# Patient Record
Sex: Female | Born: 1960 | Race: White | Hispanic: No | Marital: Married | Smoking: Current every day smoker
Health system: Southern US, Community
[De-identification: ages and names within clinical notes are randomized; demographics above are authoritative.]

## PROBLEM LIST (undated history)

## (undated) DIAGNOSIS — Z720 Tobacco use: Secondary | ICD-10-CM

## (undated) DIAGNOSIS — J449 Chronic obstructive pulmonary disease, unspecified: Secondary | ICD-10-CM

## (undated) DIAGNOSIS — C3490 Malignant neoplasm of unspecified part of unspecified bronchus or lung: Secondary | ICD-10-CM

## (undated) DIAGNOSIS — I619 Nontraumatic intracerebral hemorrhage, unspecified: Secondary | ICD-10-CM

## (undated) DIAGNOSIS — M1990 Unspecified osteoarthritis, unspecified site: Secondary | ICD-10-CM

## (undated) DIAGNOSIS — J189 Pneumonia, unspecified organism: Secondary | ICD-10-CM

## (undated) DIAGNOSIS — C539 Malignant neoplasm of cervix uteri, unspecified: Secondary | ICD-10-CM

## (undated) DIAGNOSIS — I10 Essential (primary) hypertension: Secondary | ICD-10-CM

## (undated) HISTORY — PX: OTHER SURGICAL HISTORY: SHX169

## (undated) HISTORY — PX: BRONCHOSCOPY: SUR163

## (undated) HISTORY — PX: MULTIPLE TOOTH EXTRACTIONS: SHX2053

## (undated) HISTORY — DX: Chronic obstructive pulmonary disease, unspecified: J44.9

---

## 2007-06-21 ENCOUNTER — Ambulatory Visit: Payer: Self-pay | Admitting: Physician Assistant

## 2007-07-02 ENCOUNTER — Ambulatory Visit: Payer: Self-pay | Admitting: Cardiology

## 2007-08-16 ENCOUNTER — Ambulatory Visit: Payer: Self-pay | Admitting: Internal Medicine

## 2007-10-08 ENCOUNTER — Ambulatory Visit (HOSPITAL_COMMUNITY): Admission: RE | Admit: 2007-10-08 | Discharge: 2007-10-08 | Payer: Self-pay | Admitting: Family Medicine

## 2007-10-17 ENCOUNTER — Ambulatory Visit (HOSPITAL_COMMUNITY): Admission: RE | Admit: 2007-10-17 | Discharge: 2007-10-17 | Payer: Self-pay | Admitting: Family Medicine

## 2010-11-28 DIAGNOSIS — I619 Nontraumatic intracerebral hemorrhage, unspecified: Secondary | ICD-10-CM

## 2010-11-28 HISTORY — DX: Nontraumatic intracerebral hemorrhage, unspecified: I61.9
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
## 2011-08-14 ENCOUNTER — Emergency Department (HOSPITAL_COMMUNITY)
Admission: EM | Admit: 2011-08-14 | Discharge: 2011-08-15 | Disposition: A | Discharge status: Home / Self Care | Payer: Self-pay | Attending: Emergency Medicine | Admitting: Emergency Medicine

## 2011-08-14 ENCOUNTER — Encounter: Payer: Self-pay | Admitting: *Deleted

## 2011-08-14 DIAGNOSIS — S59909A Unspecified injury of unspecified elbow, initial encounter: Secondary | ICD-10-CM | POA: Insufficient documentation

## 2011-08-14 DIAGNOSIS — M25519 Pain in unspecified shoulder: Secondary | ICD-10-CM | POA: Insufficient documentation

## 2011-08-14 DIAGNOSIS — S46909A Unspecified injury of unspecified muscle, fascia and tendon at shoulder and upper arm level, unspecified arm, initial encounter: Secondary | ICD-10-CM | POA: Insufficient documentation

## 2011-08-14 DIAGNOSIS — S63509A Unspecified sprain of unspecified wrist, initial encounter: Secondary | ICD-10-CM

## 2011-08-14 DIAGNOSIS — S6990XA Unspecified injury of unspecified wrist, hand and finger(s), initial encounter: Secondary | ICD-10-CM | POA: Insufficient documentation

## 2011-08-14 DIAGNOSIS — Y92009 Unspecified place in unspecified non-institutional (private) residence as the place of occurrence of the external cause: Secondary | ICD-10-CM | POA: Insufficient documentation

## 2011-08-14 DIAGNOSIS — M25529 Pain in unspecified elbow: Secondary | ICD-10-CM | POA: Insufficient documentation

## 2011-08-14 DIAGNOSIS — S4980XA Other specified injuries of shoulder and upper arm, unspecified arm, initial encounter: Secondary | ICD-10-CM | POA: Insufficient documentation

## 2011-08-14 HISTORY — DX: Malignant neoplasm of cervix uteri, unspecified: C53.9

## 2011-08-14 HISTORY — DX: Pneumonia, unspecified organism: J18.9
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
## 2011-08-15 ENCOUNTER — Emergency Department (HOSPITAL_COMMUNITY): Payer: Self-pay

## 2011-08-15 MED ORDER — IBUPROFEN 400 MG PO TABS
400.0000 mg | ORAL_TABLET | Freq: Once | ORAL | Status: AC
  Administered 2011-08-15: 400 mg via ORAL
  Filled 2011-08-15: qty 1

## 2011-08-15 NOTE — ED Notes (Signed)
Pt reports pain in right shoulder and right wrist.  Presently, pt able to move extremities.  No deformity noted.
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
## 2011-08-30 NOTE — ED Provider Notes (Signed)
Medical screening examination/treatment/procedure(s) were performed by non-physician practitioner and as supervising physician I was immediately available for consultation/collaboration.  Rose Hippler S. Aleaya Latona, MD 08/30/11 0753 

## 2011-10-02 ENCOUNTER — Encounter (HOSPITAL_COMMUNITY): Payer: Self-pay | Admitting: *Deleted

## 2011-10-02 ENCOUNTER — Other Ambulatory Visit: Payer: Self-pay

## 2011-10-02 ENCOUNTER — Emergency Department (HOSPITAL_COMMUNITY)
Admission: EM | Admit: 2011-10-02 | Discharge: 2011-10-03 | Disposition: A | Discharge status: Home / Self Care | Payer: Self-pay | Attending: Emergency Medicine | Admitting: Emergency Medicine

## 2011-10-02 ENCOUNTER — Emergency Department (HOSPITAL_COMMUNITY): Payer: Self-pay

## 2011-10-02 DIAGNOSIS — R0602 Shortness of breath: Secondary | ICD-10-CM | POA: Insufficient documentation

## 2011-10-02 DIAGNOSIS — R222 Localized swelling, mass and lump, trunk: Secondary | ICD-10-CM | POA: Insufficient documentation

## 2011-10-02 DIAGNOSIS — R0600 Dyspnea, unspecified: Secondary | ICD-10-CM

## 2011-10-02 DIAGNOSIS — R05 Cough: Secondary | ICD-10-CM | POA: Insufficient documentation

## 2011-10-02 DIAGNOSIS — F17200 Nicotine dependence, unspecified, uncomplicated: Secondary | ICD-10-CM | POA: Insufficient documentation

## 2011-10-02 DIAGNOSIS — Z85118 Personal history of other malignant neoplasm of bronchus and lung: Secondary | ICD-10-CM | POA: Insufficient documentation

## 2011-10-02 DIAGNOSIS — Z8541 Personal history of malignant neoplasm of cervix uteri: Secondary | ICD-10-CM | POA: Insufficient documentation

## 2011-10-02 DIAGNOSIS — R059 Cough, unspecified: Secondary | ICD-10-CM | POA: Insufficient documentation

## 2011-10-02 DIAGNOSIS — R0609 Other forms of dyspnea: Secondary | ICD-10-CM | POA: Insufficient documentation

## 2011-10-02 DIAGNOSIS — R918 Other nonspecific abnormal finding of lung field: Secondary | ICD-10-CM

## 2011-10-02 DIAGNOSIS — I4510 Unspecified right bundle-branch block: Secondary | ICD-10-CM | POA: Insufficient documentation

## 2011-10-02 DIAGNOSIS — R0989 Other specified symptoms and signs involving the circulatory and respiratory systems: Secondary | ICD-10-CM | POA: Insufficient documentation

## 2011-10-02 HISTORY — DX: Malignant neoplasm of unspecified part of unspecified bronchus or lung: C34.90

## 2011-10-02 LAB — CARDIAC PANEL(CRET KIN+CKTOT+MB+TROPI)
Relative Index: INVALID (ref 0.0–2.5)
Total CK: 67 U/L (ref 7–177)
Troponin I: <0.3 ng/mL (ref <0.30)

## 2011-10-02 LAB — BASIC METABOLIC PANEL
BUN: 3 mg/dL — ABNORMAL LOW (ref 6–23)
CO2: 26 mEq/L (ref 19–32)
GFR calc non Af Amer: >90 mL/min (ref >90)
Glucose, Bld: 86 mg/dL (ref 70–99)
Potassium: 3.8 mEq/L (ref 3.5–5.1)
Sodium: 137 mEq/L (ref 135–145)

## 2011-10-02 LAB — URINALYSIS, ROUTINE W REFLEX MICROSCOPIC
Glucose, UA: NEGATIVE mg/dL
Hgb urine dipstick: NEGATIVE
Leukocytes, UA: NEGATIVE
Nitrite: NEGATIVE
Protein, ur: NEGATIVE mg/dL

## 2011-10-02 LAB — CBC
Hemoglobin: 13.5 g/dL (ref 12.0–15.0)
MCH: 32.2 pg (ref 26.0–34.0)
MCHC: 33.3 g/dL (ref 30.0–36.0)
WBC: 11.3 10*3/uL — ABNORMAL HIGH (ref 4.0–10.5)

## 2011-10-02 MED ORDER — ALBUTEROL SULFATE (5 MG/ML) 0.5% IN NEBU
2.5000 mg | INHALATION_SOLUTION | Freq: Once | RESPIRATORY_TRACT | Status: AC
  Administered 2011-10-02: 2.5 mg via RESPIRATORY_TRACT
  Filled 2011-10-02: qty 0.5

## 2011-10-02 NOTE — ED Provider Notes (Signed)
History    Scribed for Glynn Octave, MD, the patient was seen in room APA11/APA11. This chart was scribed by Katha Cabal.   CSN: [id number] Arrival date & time: 10/02/2011 10:09 PM   First MD Initiated Contact with Patient 10/02/11 2223      Chief Complaint  Patient presents with  . Shortness of Breath    (Consider location/radiation/quality/duration/timing/severity/associated sxs/prior treatment) HPI Beth [name] is a 50 y.o. female brought in by ambulance, who presents to the Emergency Department complaining of constant persistent SOB for the past couple weeks.  SOB is associated with intermittent cough in the last couple days.  Patient has not used breathing treatment.  Shortness of breath is relieved by nothing.  Patient admits drinking EtOH beverages this evening.  Patient denies fever, leg pain and chest pain.   Patient states she was diagnosed in April with mass in lung. Patient has follow up appointment on 10/28/11 with Dr. Orson Aloe.  Patient is a smoker.   PCP   Dr. Orson Aloe        Past Medical History  Diagnosis Date  . Lung infection   . Cervical cancer   . Lung cancer     Past Surgical History  Procedure Date  . Rod in arm     No family history on file.  History  Substance Use Topics  . Smoking status: Current Everyday Smoker -- 0.5 packs/day    Types: Cigarettes  . Smokeless tobacco: Not on file  . Alcohol Use: Yes     couple of beers tonight    OB History    Grav Para Term Preterm Abortions TAB SAB Ect Mult Living                  Review of Systems  All other systems reviewed and are negative.    Allergies  Codeine  Home Medications   Current Outpatient Rx  Name Route Sig Dispense Refill  . AMOXICILLIN PO Oral Take by mouth.        BP 119/71  Pulse 94  Temp(Src) 97.7 F (36.5 C) (Oral)  Resp 20  SpO2 96%  Physical Exam  Constitutional: She is oriented to person, place, and time. She appears well-developed and  well-nourished. No distress. Nasal cannula in place.       Patient smells of alcohol.    HENT:  Head: Normocephalic and atraumatic.  Eyes: Conjunctivae and EOM are normal.  Neck: Neck supple.  Cardiovascular: Normal rate and regular rhythm.   Pulmonary/Chest: Effort normal. No respiratory distress. She has wheezes. She exhibits no tenderness.       Decreased breath sound throughout, moderate air exchange, wheezing   Abdominal: Soft. There is no tenderness.  Musculoskeletal: Normal range of motion. She exhibits no edema.  Neurological: She is alert and oriented to person, place, and time. She has normal strength. No cranial nerve deficit.  Skin: Skin is warm and dry.  Psychiatric: Her behavior is normal. Her speech is slurred.    ED Course  Procedures (including critical care time)   DIAGNOSTIC STUDIES: Oxygen Saturation is 98% on nasal cannula normal by my interpretation.    COORDINATION OF CARE:  10:35 PM  Physical exam complete.  Will order breathing treatment.  11:46 PM Will review CXR.  11:59 PM  CXR findings reviewed.  Will order CT Scan.     Orders Placed This Encounter  Procedures  . DG Chest 2 View  . CT Chest W Contrast  . CBC  .  Basic metabolic panel  . Urinalysis with microscopic  . Cardiac panel(cret kin+cktot+mb+tropi)  . D-dimer, quantitative  . Ethanol  . ED EKG     LABS / RADIOLOGY:   Labs Reviewed  CBC - Abnormal; Notable for the following:    WBC 11.3 (*)    Platelets 476 (*)    All other components within normal limits  BASIC METABOLIC PANEL - Abnormal; Notable for the following:    BUN 3 (*)    Creatinine, Ser 0.43 (*)    All other components within normal limits  URINALYSIS, ROUTINE W REFLEX MICROSCOPIC - Abnormal; Notable for the following:    Specific Gravity, Urine <1.005 (*)    All other components within normal limits  ETHANOL - Abnormal; Notable for the following:    Alcohol, Ethyl (B) 300 (*)    All other components within  normal limits  CARDIAC PANEL(CRET KIN+CKTOT+MB+TROPI)  D-DIMER, QUANTITATIVE   Dg Chest 2 View  10/02/2011  *RADIOLOGY REPORT*  Clinical Data: Short of breath  CHEST - 2 VIEW  Comparison: None.  Findings: There is a spiculated density in the central right upper lobe.  Heart is normal in size.  Lungs are hyperaerated.  No pneumothorax.  No pleural effusion.  Thoracic spine is intact.  IMPRESSION: Spiculated central right upper lobe density as described. Underlying pulmonary nodule is not excluded.  Comparison with prior studies if available would be helpful.  If none are available, CT can be performed to further delineate.  Original Report Authenticated By: Donavan Burnet, M.D.         MDM   MDM: 2 week history of persistent shortness of breath with nonproductive cough. No fever, chest pain, leg pain or swelling. Patient gives a vague history of possible lung mass that she has not received a diagnosis of yet. No current chemotherapy or radiation.  No distress.  No record of lung mass on chart review.  Vitals stable.  ETOH 300.  Remains somnolent but arousable and protecting airway.  Will need reassessment when clinically sober. CXR with RUL abnormality.  Will obtain CT to better evaluate.  Care transferred to Dr. Denton Lank.    Date: 10/02/2011  Rate: 83  Rhythm: normal sinus rhythm  QRS Axis: normal  Intervals: normal  ST/T Wave abnormalities: normal  Conduction Disutrbances:right bundle branch block  Narrative Interpretation:   Old EKG Reviewed: none available    MEDICATIONS GIVEN IN THE E.D. Scheduled Meds:    . albuterol  2.5 mg Nebulization Once   Continuous Infusions:      IMPRESSION: No diagnosis found.   I personally performed the services described in this documentation, which was scribed in my presence.  The recorded information has been reviewed and considered.           Glynn Octave, MD 10/03/11 [phone]

## 2011-10-03 ENCOUNTER — Emergency Department (HOSPITAL_COMMUNITY): Payer: Self-pay

## 2011-10-03 MED ORDER — IOHEXOL 300 MG/ML  SOLN
100.0000 mL | Freq: Once | INTRAMUSCULAR | Status: AC | PRN
  Administered 2011-10-03: 100 mL via INTRAVENOUS

## 2011-10-03 NOTE — ED Provider Notes (Signed)
Signed out by Dr Manus Gunning to d/c when ct resulted. Ct  Resulted w 2 lesions right chest. Discussed w pt. On review earlier md/pt ?whether prior workup done.  Pt to f/u w pcp and pulm for further eval.  Recheck pt no pain.n o increased wob or dyspnea.   Suzi Roots, MD 10/03/11 [phone]

## 2011-10-03 NOTE — ED Notes (Signed)
Pt remains on cardiac monitor w/ NIBP vital signs WNL. Pt resting calmly w/ eyes closed. Rise & fall of the chest noted. NAD noted at this time.

## 2011-10-03 NOTE — ED Notes (Signed)
Pt remains on cardiac monitor w/ NIBP vital signs WNL. Pt resting calmly w/ eyes closed. Rise & fall of the chest noted. NAD noted at this time.   

## 2011-10-26 DIAGNOSIS — S065X9A Traumatic subdural hemorrhage with loss of consciousness of unspecified duration, initial encounter: Secondary | ICD-10-CM | POA: Insufficient documentation

## 2011-10-26 DIAGNOSIS — M47022 Vertebral artery compression syndromes, cervical region: Secondary | ICD-10-CM | POA: Insufficient documentation

## 2011-10-26 DIAGNOSIS — F1010 Alcohol abuse, uncomplicated: Secondary | ICD-10-CM | POA: Insufficient documentation

## 2011-10-26 DIAGNOSIS — F1910 Other psychoactive substance abuse, uncomplicated: Secondary | ICD-10-CM | POA: Insufficient documentation

## 2011-10-26 DIAGNOSIS — S129XXA Fracture of neck, unspecified, initial encounter: Secondary | ICD-10-CM | POA: Insufficient documentation

## 2011-10-26 DIAGNOSIS — R911 Solitary pulmonary nodule: Secondary | ICD-10-CM | POA: Insufficient documentation

## 2011-10-26 DIAGNOSIS — S0230XA Fracture of orbital floor, unspecified side, initial encounter for closed fracture: Secondary | ICD-10-CM | POA: Insufficient documentation

## 2011-10-26 HISTORY — DX: Alcohol abuse, uncomplicated: F10.10

## 2014-11-21 ENCOUNTER — Emergency Department (HOSPITAL_COMMUNITY): Payer: Self-pay

## 2014-11-21 ENCOUNTER — Emergency Department (HOSPITAL_COMMUNITY)
Admission: EM | Admit: 2014-11-21 | Discharge: 2014-11-21 | Disposition: A | Discharge status: Home / Self Care | Payer: Self-pay | Attending: Emergency Medicine | Admitting: Emergency Medicine

## 2014-11-21 ENCOUNTER — Encounter (HOSPITAL_COMMUNITY): Payer: Self-pay | Admitting: *Deleted

## 2014-11-21 DIAGNOSIS — Z8709 Personal history of other diseases of the respiratory system: Secondary | ICD-10-CM | POA: Insufficient documentation

## 2014-11-21 DIAGNOSIS — Z720 Tobacco use: Secondary | ICD-10-CM | POA: Insufficient documentation

## 2014-11-21 DIAGNOSIS — Z8679 Personal history of other diseases of the circulatory system: Secondary | ICD-10-CM | POA: Insufficient documentation

## 2014-11-21 DIAGNOSIS — Y9389 Activity, other specified: Secondary | ICD-10-CM | POA: Insufficient documentation

## 2014-11-21 DIAGNOSIS — S0990XA Unspecified injury of head, initial encounter: Secondary | ICD-10-CM | POA: Insufficient documentation

## 2014-11-21 DIAGNOSIS — S79911A Unspecified injury of right hip, initial encounter: Secondary | ICD-10-CM | POA: Insufficient documentation

## 2014-11-21 DIAGNOSIS — Z85118 Personal history of other malignant neoplasm of bronchus and lung: Secondary | ICD-10-CM | POA: Insufficient documentation

## 2014-11-21 DIAGNOSIS — Y998 Other external cause status: Secondary | ICD-10-CM | POA: Insufficient documentation

## 2014-11-21 DIAGNOSIS — Z8541 Personal history of malignant neoplasm of cervix uteri: Secondary | ICD-10-CM | POA: Insufficient documentation

## 2014-11-21 DIAGNOSIS — S46911A Strain of unspecified muscle, fascia and tendon at shoulder and upper arm level, right arm, initial encounter: Secondary | ICD-10-CM | POA: Insufficient documentation

## 2014-11-21 DIAGNOSIS — Y9289 Other specified places as the place of occurrence of the external cause: Secondary | ICD-10-CM | POA: Insufficient documentation

## 2014-11-21 DIAGNOSIS — S301XXA Contusion of abdominal wall, initial encounter: Secondary | ICD-10-CM | POA: Insufficient documentation

## 2014-11-21 HISTORY — DX: Nontraumatic intracerebral hemorrhage, unspecified: I61.9

## 2014-11-21 LAB — CBC WITH DIFFERENTIAL/PLATELET
BASOS PCT: 1 % (ref 0–1)
Basophils Absolute: 0.1 10*3/uL (ref 0.0–0.1)
EOS ABS: 0.4 10*3/uL (ref 0.0–0.7)
Eosinophils Relative: 3 % (ref 0–5)
HCT: 44.2 % (ref 36.0–46.0)
HEMOGLOBIN: 14.7 g/dL (ref 12.0–15.0)
Lymphocytes Relative: 40 % (ref 12–46)
Lymphs Abs: 4.8 10*3/uL — ABNORMAL HIGH (ref 0.7–4.0)
MCH: 32.2 pg (ref 26.0–34.0)
MCHC: 33.3 g/dL (ref 30.0–36.0)
MCV: 96.9 fL (ref 78.0–100.0)
MONO ABS: 1 10*3/uL (ref 0.1–1.0)
Monocytes Relative: 8 % (ref 3–12)
Neutro Abs: 5.9 10*3/uL (ref 1.7–7.7)
Neutrophils Relative %: 48 % (ref 43–77)
PLATELETS: 355 10*3/uL (ref 150–400)
RBC: 4.56 MIL/uL (ref 3.87–5.11)
RDW: 13.1 % (ref 11.5–15.5)
WBC: 12.1 10*3/uL — AB (ref 4.0–10.5)

## 2014-11-21 LAB — COMPREHENSIVE METABOLIC PANEL
ALK PHOS: 46 U/L (ref 39–117)
ALT: 20 U/L (ref 0–35)
ANION GAP: 9 (ref 5–15)
AST: 27 U/L (ref 0–37)
Albumin: 4.5 g/dL (ref 3.5–5.2)
BUN: 5 mg/dL — ABNORMAL LOW (ref 6–23)
CALCIUM: 8.9 mg/dL (ref 8.4–10.5)
CHLORIDE: 110 meq/L (ref 96–112)
CO2: 23 mmol/L (ref 19–32)
Creatinine, Ser: 0.49 mg/dL — ABNORMAL LOW (ref 0.50–1.10)
GFR calc Af Amer: >90 mL/min (ref >90)
GLUCOSE: 82 mg/dL (ref 70–99)
Potassium: 3.7 mmol/L (ref 3.5–5.1)
SODIUM: 142 mmol/L (ref 135–145)
Total Bilirubin: 0.4 mg/dL (ref 0.3–1.2)
Total Protein: 7.9 g/dL (ref 6.0–8.3)

## 2014-11-21 MED ORDER — SODIUM CHLORIDE 0.9 % IJ SOLN
INTRAMUSCULAR | Status: AC
  Filled 2014-11-21: qty 600

## 2014-11-21 MED ORDER — SODIUM CHLORIDE 0.9 % IV BOLUS (SEPSIS)
500.0000 mL | Freq: Once | INTRAVENOUS | Status: AC
  Administered 2014-11-21: 500 mL via INTRAVENOUS

## 2014-11-21 MED ORDER — OXYCODONE-ACETAMINOPHEN 5-325 MG PO TABS
1.0000 | ORAL_TABLET | Freq: Four times a day (QID) | ORAL | Status: DC | PRN
Start: 2014-11-21 — End: 2019-10-30

## 2014-11-21 MED ORDER — ONDANSETRON HCL 4 MG/2ML IJ SOLN
4.0000 mg | Freq: Once | INTRAMUSCULAR | Status: AC
  Administered 2014-11-21: 4 mg via INTRAVENOUS
  Filled 2014-11-21: qty 2

## 2014-11-21 MED ORDER — IOHEXOL 300 MG/ML  SOLN
100.0000 mL | Freq: Once | INTRAMUSCULAR | Status: AC | PRN
  Administered 2014-11-21: 100 mL via INTRAVENOUS

## 2014-11-21 MED ORDER — SODIUM CHLORIDE 0.9 % IJ SOLN
INTRAMUSCULAR | Status: AC
  Filled 2014-11-21: qty 30

## 2014-11-21 MED ORDER — HYDROMORPHONE HCL 1 MG/ML IJ SOLN
0.5000 mg | Freq: Once | INTRAMUSCULAR | Status: AC
  Administered 2014-11-21: 0.5 mg via INTRAVENOUS
  Filled 2014-11-21: qty 1

## 2014-11-21 MED ORDER — SODIUM CHLORIDE 0.9 % IJ SOLN
INTRAMUSCULAR | Status: AC
  Filled 2014-11-21: qty 100
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
## 2014-11-21 NOTE — Discharge Instructions (Signed)
Shoulder Sprain A shoulder sprain is the result of damage to the tough, fiber-like tissues (ligaments) that help hold your shoulder in place. The ligaments may be stretched or torn. Besides the main shoulder joint (the ball and socket), there are several smaller joints that connect the bones in this area. A sprain usually involves one of those joints. Most often it is the acromioclavicular (or AC) joint. That is the joint that connects the collarbone (clavicle) and the shoulder blade (scapula) at the top point of the shoulder blade (acromion). A shoulder sprain is a mild form of what is called a shoulder separation. Recovering from a shoulder sprain may take some time. For some, pain lingers for several months. Most people recover without long term problems. CAUSES   A shoulder sprain is usually caused by some kind of trauma. This might be:  Falling on an outstretched arm.  Being hit hard on the shoulder.  Twisting the arm.  Shoulder sprains are more likely to occur in people who:  Play sports.  Have balance or coordination problems. SYMPTOMS   Pain when you move your shoulder.  Limited ability to move the shoulder.  Swelling and tenderness on top of the shoulder.  Redness or warmth in the shoulder.  Bruising.  A change in the shape of the shoulder. DIAGNOSIS  Your healthcare provider may:  Ask about your symptoms.  Ask about recent activity that might have caused those symptoms.  Examine your shoulder. You may be asked to do simple exercises to test movement. The other shoulder will be examined for comparison.  Order some tests that provide a look inside the body. They can show the extent of the injury. The tests could include:  X-rays.  CT (computed tomography) scan.  MRI (magnetic resonance imaging) scan. RISKS AND COMPLICATIONS  Loss of full shoulder motion.  Ongoing shoulder pain. TREATMENT  How long it takes to recover from a shoulder sprain depends on how  severe it was. Treatment options may include:  Rest. You should not use the arm or shoulder until it heals.  Ice. For 2 or 3 days after the injury, put an ice pack on the shoulder up to 4 times a day. It should stay on for 15 to 20 minutes each time. Wrap the ice in a towel so it does not touch your skin.  Over-the-counter medicine to relieve pain.  A sling or brace. This will keep the arm still while the shoulder is healing.  Physical therapy or rehabilitation exercises. These will help you regain strength and motion. Ask your healthcare provider when it is OK to begin these exercises.  Surgery. The need for surgery is rare with a sprained shoulder, but some people may need surgery to keep the joint in place and reduce pain. HOME CARE INSTRUCTIONS   Ask your healthcare provider about what you should and should not do while your shoulder heals.  Make sure you know how to apply ice to the correct area of your shoulder.  Talk with your healthcare provider about which medications should be used for pain and swelling.  If rehabilitation therapy will be needed, ask your healthcare provider to refer you to a therapist. If it is not recommended, then ask about at-home exercises. Find out when exercise should begin. SEEK MEDICAL CARE IF:  Your pain, swelling, or redness at the joint increases. SEEK IMMEDIATE MEDICAL CARE IF:   You have a fever.  You cannot move your arm or shoulder. Document Released: 04/02/2009 Document  Revised: 02/06/2012 Document Reviewed: 04/02/2009 [employer] Patient Information 2015 [place], [place]. This information is not intended to replace advice given to you by your health care provider. Make sure you discuss any questions you have with your health care provider.

## 2015-03-30 ENCOUNTER — Emergency Department (HOSPITAL_COMMUNITY)
Admission: EM | Admit: 2015-03-30 | Discharge: 2015-03-30 | Disposition: A | Discharge status: Home / Self Care | Payer: Self-pay | Attending: Emergency Medicine | Admitting: Emergency Medicine

## 2015-03-30 ENCOUNTER — Emergency Department (HOSPITAL_COMMUNITY): Payer: Self-pay

## 2015-03-30 ENCOUNTER — Encounter (HOSPITAL_COMMUNITY): Payer: Self-pay

## 2015-03-30 DIAGNOSIS — Z720 Tobacco use: Secondary | ICD-10-CM | POA: Insufficient documentation

## 2015-03-30 DIAGNOSIS — Z8541 Personal history of malignant neoplasm of cervix uteri: Secondary | ICD-10-CM | POA: Insufficient documentation

## 2015-03-30 DIAGNOSIS — Z85118 Personal history of other malignant neoplasm of bronchus and lung: Secondary | ICD-10-CM | POA: Insufficient documentation

## 2015-03-30 DIAGNOSIS — M1712 Unilateral primary osteoarthritis, left knee: Secondary | ICD-10-CM | POA: Insufficient documentation

## 2015-03-30 DIAGNOSIS — Z8709 Personal history of other diseases of the respiratory system: Secondary | ICD-10-CM | POA: Insufficient documentation

## 2015-03-30 DIAGNOSIS — Z8679 Personal history of other diseases of the circulatory system: Secondary | ICD-10-CM | POA: Insufficient documentation

## 2015-03-30 DIAGNOSIS — Z79899 Other long term (current) drug therapy: Secondary | ICD-10-CM | POA: Insufficient documentation

## 2015-03-30 MED ORDER — IBUPROFEN 600 MG PO TABS: 600.0000 mg | ORAL_TABLET | Freq: Four times a day (QID) | ORAL | Status: DC | PRN

## 2015-03-30 MED ORDER — IBUPROFEN 800 MG PO TABS
800.0000 mg | ORAL_TABLET | Freq: Once | ORAL | Status: AC
  Administered 2015-03-30: 800 mg via ORAL
  Filled 2015-03-30: qty 1

## 2015-03-30 MED ORDER — HYDROCODONE-ACETAMINOPHEN 5-325 MG PO TABS
1.0000 | ORAL_TABLET | Freq: Once | ORAL | Status: AC
  Administered 2015-03-30: 1 via ORAL
  Filled 2015-03-30: qty 1

## 2015-03-30 MED ORDER — HYDROCODONE-ACETAMINOPHEN 5-325 MG PO TABS: 1.0000 | ORAL_TABLET | ORAL | Status: DC | PRN

## 2015-03-30 NOTE — ED Provider Notes (Signed)
CSN: [id number]     Arrival date & time 03/30/15  2000 History   First MD Initiated Contact with Patient 53/02/16 2008     Chief Complaint  Patient presents with  . Knee Pain     (Consider location/radiation/quality/duration/timing/severity/associated sxs/prior Treatment) The history is provided by the patient.   Beth [name] is a 54 y.o. female presenting with painful left knee.  She was simply walking around her home this afternoon when she developed sudden onset of pain worsened with movement and weight bearing.  She denies injury.  She reports having problems with this same knee years ago and had to have fluid drawn off the joint but never found out her final diagnosis.  Her pain is sharp and at times radiates into her lower leg.  She has taken no medicines for this prior to arrival.    Past Medical History  Diagnosis Date  . Lung infection   . Cervical cancer   . Lung cancer   . Brain bleed    Past Surgical History  Procedure Laterality Date  . Rod in arm     History reviewed. No pertinent family history. History  Substance Use Topics  . Smoking status: Current Every Day Smoker -- 1.00 packs/day    Types: Cigarettes  . Smokeless tobacco: Not on file  . Alcohol Use: Yes     Comment: + ETOH today    OB History    No data available     Review of Systems  Constitutional: Negative for fever and chills.  Musculoskeletal: Positive for arthralgias. Negative for myalgias and joint swelling.  Neurological: Negative for weakness and numbness.      Allergies  Codeine  Home Medications   Prior to Admission medications   Medication Sig Start Date End Date Taking? Authorizing Provider  albuterol (PROVENTIL HFA;VENTOLIN HFA) 108 (90 BASE) MCG/ACT inhaler Inhale 1-2 puffs into the lungs every 6 (six) hours as needed for wheezing or shortness of breath.   Yes Historical Provider, MD  HYDROcodone-acetaminophen (NORCO/VICODIN) 5-325 MG per tablet Take 1 tablet by mouth every 4  (four) hours as needed. 03/30/15   Evalee Jefferson, PA-C  ibuprofen (ADVIL,MOTRIN) 600 MG tablet Take 1 tablet (600 mg total) by mouth every 6 (six) hours as needed. 03/30/15   Evalee Jefferson, PA-C  oxyCODONE-acetaminophen (PERCOCET/ROXICET) 5-325 MG per tablet Take 1-2 tablets by mouth every 6 (six) hours as needed for severe pain. Patient not taking: Reported on 03/30/2015 11/21/14   Davonna Belling, MD   BP 137/90 mmHg  Pulse 84  Temp(Src) 98 F (36.7 C) (Oral)  Resp 20  Ht '5\' 2"'$  (1.575 m)  Wt 90 lb 1.6 oz (40.869 kg)  BMI 16.48 kg/m2  SpO2 96% Physical Exam  Constitutional: She appears well-developed and well-nourished.  HENT:  Head: Atraumatic.  Neck: Normal range of motion.  Cardiovascular:  Pulses equal bilaterally  Musculoskeletal: She exhibits tenderness.       Left knee: She exhibits bony tenderness. She exhibits no swelling, no effusion, no deformity, no erythema, no LCL laxity and no MCL laxity. Tenderness found. Medial joint line tenderness noted.  Mild crepitus with ROM.  Neurological: She is alert. She has normal strength. She displays normal reflexes. No sensory deficit.  Skin: Skin is warm and dry.  Psychiatric: She has a normal mood and affect.    ED Course  Procedures (including critical care time) Labs Review Labs Reviewed - No data to display  Imaging Review Dg Knee Complete 4 Views Left  03/30/2015   CLINICAL DATA:  Left knee pain, 1 day duration.  No trauma.  EXAM: LEFT KNEE - COMPLETE 4+ VIEW  COMPARISON:  None.  FINDINGS: Four views of the left near negative for fracture, dislocation or radiopaque foreign body. There are osteoarthritic changes, predominantly involving the medial compartment. There is no bone lesion or bony destruction.  IMPRESSION: Osteoarthritis, medial compartment.   Electronically Signed   By: Andreas Newport M.D.   On: 03/30/2015 21:02     EKG Interpretation None      MDM   Final diagnoses:  Primary osteoarthritis of left knee     Patients labs and/or radiological studies were reviewed and considered during the medical decision making and disposition process.  Results were also discussed with patient. Pt was placed on ibuprofen, few hydrocodone prescribed.  Referrals given to establish pcp and ortho prn if sx do not improve with tx.   Evalee Jefferson, PA-C 03/30/15 2136  Milton Ferguson, MD 04/01/15 [phone]

## 2015-03-30 NOTE — ED Notes (Signed)
PA Almyra Free at bedside.

## 2015-03-30 NOTE — ED Notes (Signed)
Patient c/o left knee pain that started today. Patient denies injury. Patient is ambulatory into triage.

## 2015-03-30 NOTE — Discharge Instructions (Signed)
Arthritis, Nonspecific °Arthritis is inflammation of a joint. This usually means pain, redness, warmth or swelling are present. One or more joints may be involved. There are a number of types of arthritis. Your caregiver may not be able to tell what type of arthritis you have right away. °CAUSES  °The most common cause of arthritis is the wear and tear on the joint (osteoarthritis). This causes damage to the cartilage, which can break down over time. The knees, hips, back and neck are most often affected by this type of arthritis. °Other types of arthritis and common causes of joint pain include: °· Sprains and other injuries near the joint. Sometimes minor sprains and injuries cause pain and swelling that develop hours later. °· Rheumatoid arthritis. This affects hands, feet and knees. It usually affects both sides of your body at the same time. It is often associated with chronic ailments, fever, weight loss and general weakness. °· Crystal arthritis. Gout and pseudo gout can cause occasional acute severe pain, redness and swelling in the foot, ankle, or knee. °· Infectious arthritis. Bacteria can get into a joint through a break in overlying skin. This can cause infection of the joint. Bacteria and viruses can also spread through the blood and affect your joints. °· Drug, infectious and allergy reactions. Sometimes joints can become mildly painful and slightly swollen with these types of illnesses. °SYMPTOMS  °· Pain is the main symptom. °· Your joint or joints can also be red, swollen and warm or hot to the touch. °· You may have a fever with certain types of arthritis, or even feel overall ill. °· The joint with arthritis will hurt with movement. Stiffness is present with some types of arthritis. °DIAGNOSIS  °Your caregiver will suspect arthritis based on your description of your symptoms and on your exam. Testing may be needed to find the type of arthritis: °· Blood and sometimes urine tests. °· X-ray tests  and sometimes CT or MRI scans. °· Removal of fluid from the joint (arthrocentesis) is done to check for bacteria, crystals or other causes. Your caregiver (or a specialist) will numb the area over the joint with a local anesthetic, and use a needle to remove joint fluid for examination. This procedure is only minimally uncomfortable. °· Even with these tests, your caregiver may not be able to tell what kind of arthritis you have. Consultation with a specialist (rheumatologist) may be helpful. °TREATMENT  °Your caregiver will discuss with you treatment specific to your type of arthritis. If the specific type cannot be determined, then the following general recommendations may apply. °Treatment of severe joint pain includes: °· Rest. °· Elevation. °· Anti-inflammatory medication (for example, ibuprofen) may be prescribed. Avoiding activities that cause increased pain. °· Only take over-the-counter or prescription medicines for pain and discomfort as recommended by your caregiver. °· Cold packs over an inflamed joint may be used for 10 to 15 minutes every hour. Hot packs sometimes feel better, but do not use overnight. Do not use hot packs if you are diabetic without your caregiver's permission. °· A cortisone shot into arthritic joints may help reduce pain and swelling. °· Any acute arthritis that gets worse over the next 1 to 2 days needs to be looked at to be sure there is no joint infection. °Long-term arthritis treatment involves modifying activities and lifestyle to reduce joint stress jarring. This can include weight loss. Also, exercise is needed to nourish the joint cartilage and remove waste. This helps keep the muscles   around the joint strong. HOME CARE INSTRUCTIONS   Do not take aspirin to relieve pain if gout is suspected. This elevates uric acid levels.  Only take over-the-counter or prescription medicines for pain, discomfort or fever as directed by your caregiver.  Rest the joint as much as  possible.  If your joint is swollen, keep it elevated.  Use crutches if the painful joint is in your leg.  Drinking plenty of fluids may help for certain types of arthritis.  Follow your caregiver's dietary instructions.  Try low-impact exercise such as:  Swimming.  Water aerobics.  Biking.  Walking.  Morning stiffness is often relieved by a warm shower.  Put your joints through regular range-of-motion. SEEK MEDICAL CARE IF:   You do not feel better in 24 hours or are getting worse.  You have side effects to medications, or are not getting better with treatment. SEEK IMMEDIATE MEDICAL CARE IF:   You have a fever.  You develop severe joint pain, swelling or redness.  Many joints are involved and become painful and swollen.  There is severe back pain and/or leg weakness.  You have loss of bowel or bladder control. Document Released: 12/22/2004 Document Revised: 02/06/2012 Document Reviewed: 01/07/2009 [facility] Patient Information 2015 [place], [place]. This information is not intended to replace advice given to you by your health care provider. Make sure you discuss any questions you have with your health care provider.   Take your next dose of ibuprofen tomorrow morning.   Do not drive within 4 hours of taking hydrocodone as this will make you drowsy.  Avoid lifting,  Bending,  Twisting or any other activity that worsens your pain over the next week.  Apply heat to your knee in 20 minute increments thoughout the day, at least 3-4 times.

## 2017-03-05 ENCOUNTER — Encounter (HOSPITAL_COMMUNITY): Payer: Self-pay | Admitting: Emergency Medicine

## 2017-03-05 ENCOUNTER — Emergency Department (HOSPITAL_COMMUNITY): Payer: Self-pay

## 2017-03-05 ENCOUNTER — Emergency Department (HOSPITAL_COMMUNITY)
Admission: EM | Admit: 2017-03-05 | Discharge: 2017-03-05 | Disposition: A | Discharge status: Home / Self Care | Payer: Self-pay | Attending: Emergency Medicine | Admitting: Emergency Medicine

## 2017-03-05 DIAGNOSIS — F17210 Nicotine dependence, cigarettes, uncomplicated: Secondary | ICD-10-CM | POA: Insufficient documentation

## 2017-03-05 DIAGNOSIS — W19XXXA Unspecified fall, initial encounter: Secondary | ICD-10-CM

## 2017-03-05 DIAGNOSIS — M25562 Pain in left knee: Secondary | ICD-10-CM | POA: Insufficient documentation

## 2017-03-05 DIAGNOSIS — Y92512 Supermarket, store or market as the place of occurrence of the external cause: Secondary | ICD-10-CM | POA: Insufficient documentation

## 2017-03-05 DIAGNOSIS — Z85118 Personal history of other malignant neoplasm of bronchus and lung: Secondary | ICD-10-CM | POA: Insufficient documentation

## 2017-03-05 DIAGNOSIS — Y999 Unspecified external cause status: Secondary | ICD-10-CM | POA: Insufficient documentation

## 2017-03-05 DIAGNOSIS — W010XXA Fall on same level from slipping, tripping and stumbling without subsequent striking against object, initial encounter: Secondary | ICD-10-CM | POA: Insufficient documentation

## 2017-03-05 DIAGNOSIS — Z8541 Personal history of malignant neoplasm of cervix uteri: Secondary | ICD-10-CM | POA: Insufficient documentation

## 2017-03-05 DIAGNOSIS — Y939 Activity, unspecified: Secondary | ICD-10-CM | POA: Insufficient documentation

## 2017-03-05 MED ORDER — IBUPROFEN 400 MG PO TABS
600.0000 mg | ORAL_TABLET | Freq: Once | ORAL | Status: AC
  Administered 2017-03-05: 600 mg via ORAL
  Filled 2017-03-05: qty 2

## 2017-03-05 MED ORDER — ACETAMINOPHEN 500 MG PO TABS
1000.0000 mg | ORAL_TABLET | Freq: Once | ORAL | Status: AC
  Administered 2017-03-05: 1000 mg via ORAL
  Filled 2017-03-05: qty 2

## 2017-03-05 NOTE — ED Notes (Signed)
Pt returned from xray
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
## 2017-03-05 NOTE — ED Triage Notes (Addendum)
Patient c/o left knee pain after falling in store yesterday. Denies hitting head or LOC. Per patient some low back pain and hip pain. Patient able to bear weight and ambulate to triage. No obvious deformity noted, no rotation or shortening of leg noted. Patient reports taking ibuprofen yesterday with no relief.

## 2017-03-05 NOTE — Discharge Instructions (Signed)
Your x-rays did not show a bony injury. Your pain is likely from the impact that caused inflammation in your joint and soft tissues.  Take ibuprofen 600 mg or tylenol 1000 mg every 8 hours for pain. Rest. Ice. Elevate your knee to decrease inflammation.   Return to ED for worsening pain, warmth, redness, swelling, fevers, numbness or tingling to your foot/toes.

## 2017-03-05 NOTE — ED Notes (Signed)
Patient transported to X-ray 

## 2018-12-20 DIAGNOSIS — Z7689 Persons encountering health services in other specified circumstances: Secondary | ICD-10-CM | POA: Diagnosis not present

## 2019-01-01 DIAGNOSIS — Z7689 Persons encountering health services in other specified circumstances: Secondary | ICD-10-CM | POA: Diagnosis not present

## 2019-07-06 DIAGNOSIS — S299XXA Unspecified injury of thorax, initial encounter: Secondary | ICD-10-CM | POA: Diagnosis not present

## 2019-07-06 DIAGNOSIS — R51 Headache: Secondary | ICD-10-CM | POA: Diagnosis not present

## 2019-07-06 DIAGNOSIS — M542 Cervicalgia: Secondary | ICD-10-CM | POA: Diagnosis not present

## 2019-07-06 DIAGNOSIS — S0990XA Unspecified injury of head, initial encounter: Secondary | ICD-10-CM | POA: Diagnosis not present

## 2019-07-06 DIAGNOSIS — S199XXA Unspecified injury of neck, initial encounter: Secondary | ICD-10-CM | POA: Diagnosis not present

## 2019-07-06 DIAGNOSIS — S3992XA Unspecified injury of lower back, initial encounter: Secondary | ICD-10-CM | POA: Diagnosis not present

## 2019-07-06 DIAGNOSIS — M545 Low back pain: Secondary | ICD-10-CM | POA: Diagnosis not present

## 2019-07-08 DIAGNOSIS — R5382 Chronic fatigue, unspecified: Secondary | ICD-10-CM | POA: Diagnosis not present

## 2019-07-08 DIAGNOSIS — Z7689 Persons encountering health services in other specified circumstances: Secondary | ICD-10-CM | POA: Diagnosis not present

## 2019-07-08 DIAGNOSIS — M2550 Pain in unspecified joint: Secondary | ICD-10-CM | POA: Diagnosis not present

## 2019-07-12 DIAGNOSIS — E559 Vitamin D deficiency, unspecified: Secondary | ICD-10-CM | POA: Diagnosis not present

## 2019-07-12 DIAGNOSIS — E7849 Other hyperlipidemia: Secondary | ICD-10-CM | POA: Diagnosis not present

## 2019-07-12 DIAGNOSIS — R5383 Other fatigue: Secondary | ICD-10-CM | POA: Diagnosis not present

## 2019-07-12 DIAGNOSIS — I110 Hypertensive heart disease with heart failure: Secondary | ICD-10-CM | POA: Diagnosis not present

## 2019-07-12 DIAGNOSIS — D510 Vitamin B12 deficiency anemia due to intrinsic factor deficiency: Secondary | ICD-10-CM | POA: Diagnosis not present

## 2019-07-15 DIAGNOSIS — E559 Vitamin D deficiency, unspecified: Secondary | ICD-10-CM | POA: Diagnosis not present

## 2019-07-15 DIAGNOSIS — J449 Chronic obstructive pulmonary disease, unspecified: Secondary | ICD-10-CM | POA: Diagnosis not present

## 2019-07-24 DIAGNOSIS — Z7689 Persons encountering health services in other specified circumstances: Secondary | ICD-10-CM | POA: Diagnosis not present

## 2019-10-01 ENCOUNTER — Other Ambulatory Visit (HOSPITAL_COMMUNITY): Payer: Self-pay | Admitting: Family Medicine

## 2019-10-01 DIAGNOSIS — E559 Vitamin D deficiency, unspecified: Secondary | ICD-10-CM | POA: Diagnosis not present

## 2019-10-01 DIAGNOSIS — Z7689 Persons encountering health services in other specified circumstances: Secondary | ICD-10-CM | POA: Diagnosis not present

## 2019-10-01 DIAGNOSIS — Z1231 Encounter for screening mammogram for malignant neoplasm of breast: Secondary | ICD-10-CM

## 2019-10-07 ENCOUNTER — Encounter: Payer: Self-pay | Admitting: Gastroenterology

## 2019-10-09 ENCOUNTER — Encounter: Payer: Self-pay | Admitting: Orthopedic Surgery

## 2019-10-09 DIAGNOSIS — S4991XA Unspecified injury of right shoulder and upper arm, initial encounter: Secondary | ICD-10-CM | POA: Diagnosis not present

## 2019-10-09 DIAGNOSIS — S0990XA Unspecified injury of head, initial encounter: Secondary | ICD-10-CM | POA: Diagnosis not present

## 2019-10-09 DIAGNOSIS — S79911A Unspecified injury of right hip, initial encounter: Secondary | ICD-10-CM | POA: Diagnosis not present

## 2019-10-09 DIAGNOSIS — S59901A Unspecified injury of right elbow, initial encounter: Secondary | ICD-10-CM | POA: Diagnosis not present

## 2019-10-09 DIAGNOSIS — M25511 Pain in right shoulder: Secondary | ICD-10-CM | POA: Diagnosis not present

## 2019-10-09 DIAGNOSIS — M25461 Effusion, right knee: Secondary | ICD-10-CM | POA: Diagnosis not present

## 2019-10-09 DIAGNOSIS — R519 Headache, unspecified: Secondary | ICD-10-CM | POA: Diagnosis not present

## 2019-10-09 DIAGNOSIS — M25551 Pain in right hip: Secondary | ICD-10-CM | POA: Diagnosis not present

## 2019-10-09 DIAGNOSIS — S82144A Nondisplaced bicondylar fracture of right tibia, initial encounter for closed fracture: Secondary | ICD-10-CM | POA: Diagnosis not present

## 2019-10-09 DIAGNOSIS — S199XXA Unspecified injury of neck, initial encounter: Secondary | ICD-10-CM | POA: Diagnosis not present

## 2019-10-09 DIAGNOSIS — M25571 Pain in right ankle and joints of right foot: Secondary | ICD-10-CM | POA: Diagnosis not present

## 2019-10-09 DIAGNOSIS — S99911A Unspecified injury of right ankle, initial encounter: Secondary | ICD-10-CM | POA: Diagnosis not present

## 2019-10-09 DIAGNOSIS — S82291A Other fracture of shaft of right tibia, initial encounter for closed fracture: Secondary | ICD-10-CM | POA: Diagnosis not present

## 2019-10-09 DIAGNOSIS — M25521 Pain in right elbow: Secondary | ICD-10-CM | POA: Diagnosis not present

## 2019-10-11 ENCOUNTER — Other Ambulatory Visit: Payer: Self-pay

## 2019-10-11 ENCOUNTER — Ambulatory Visit (HOSPITAL_COMMUNITY)
Admission: RE | Admit: 2019-10-11 | Discharge: 2019-10-11 | Disposition: A | Discharge status: Home / Self Care | Payer: Medicaid Other | Source: Ambulatory Visit | Attending: Family Medicine | Admitting: Family Medicine

## 2019-10-11 DIAGNOSIS — Z1231 Encounter for screening mammogram for malignant neoplasm of breast: Secondary | ICD-10-CM

## 2019-10-11 DIAGNOSIS — Z7689 Persons encountering health services in other specified circumstances: Secondary | ICD-10-CM | POA: Diagnosis not present

## 2019-10-13 ENCOUNTER — Emergency Department (HOSPITAL_COMMUNITY)
Admission: EM | Admit: 2019-10-13 | Discharge: 2019-10-13 | Disposition: A | Discharge status: Home / Self Care | Payer: Medicaid Other | Attending: Emergency Medicine | Admitting: Emergency Medicine

## 2019-10-13 ENCOUNTER — Encounter (HOSPITAL_COMMUNITY): Payer: Self-pay | Admitting: *Deleted

## 2019-10-13 ENCOUNTER — Other Ambulatory Visit: Payer: Self-pay

## 2019-10-13 DIAGNOSIS — M7989 Other specified soft tissue disorders: Secondary | ICD-10-CM | POA: Diagnosis not present

## 2019-10-13 DIAGNOSIS — F17210 Nicotine dependence, cigarettes, uncomplicated: Secondary | ICD-10-CM | POA: Diagnosis not present

## 2019-10-13 DIAGNOSIS — R2242 Localized swelling, mass and lump, left lower limb: Secondary | ICD-10-CM | POA: Insufficient documentation

## 2019-10-13 MED ORDER — INFLUENZA VAC SPLIT QUAD 0.5 ML IM SUSY: 0.5000 mL | PREFILLED_SYRINGE | INTRAMUSCULAR | Status: DC
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
## 2019-10-13 NOTE — Discharge Instructions (Addendum)
Take your knee wrap of twice a day for about an hour and keep your leg elevated.  Follow-up Tuesday as planned
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
## 2019-10-15 DIAGNOSIS — S82141A Displaced bicondylar fracture of right tibia, initial encounter for closed fracture: Secondary | ICD-10-CM | POA: Diagnosis not present

## 2019-10-17 ENCOUNTER — Telehealth: Payer: Self-pay | Admitting: Obstetrics and Gynecology

## 2019-10-17 NOTE — Telephone Encounter (Signed)

## 2019-10-18 ENCOUNTER — Encounter: Payer: Medicaid Other | Admitting: Obstetrics and Gynecology

## 2019-10-23 ENCOUNTER — Encounter: Payer: Medicaid Other | Admitting: Obstetrics and Gynecology
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
## 2019-10-30 ENCOUNTER — Encounter: Payer: Self-pay | Admitting: Gastroenterology

## 2019-10-30 ENCOUNTER — Other Ambulatory Visit: Payer: Self-pay | Admitting: *Deleted

## 2019-10-30 ENCOUNTER — Encounter: Payer: Self-pay | Admitting: *Deleted

## 2019-10-30 ENCOUNTER — Ambulatory Visit: Payer: Medicaid Other | Admitting: Gastroenterology

## 2019-10-30 ENCOUNTER — Other Ambulatory Visit: Payer: Self-pay

## 2019-10-30 VITALS — BP 135/90 | HR 81 | Temp 98.2°F | Ht 62.0 in | Wt 84.8 lb

## 2019-10-30 DIAGNOSIS — Z1211 Encounter for screening for malignant neoplasm of colon: Secondary | ICD-10-CM

## 2019-10-30 DIAGNOSIS — K5900 Constipation, unspecified: Secondary | ICD-10-CM | POA: Insufficient documentation

## 2019-10-30 DIAGNOSIS — Z7689 Persons encountering health services in other specified circumstances: Secondary | ICD-10-CM | POA: Diagnosis not present

## 2019-10-30 MED ORDER — CLENPIQ 10-3.5-12 MG-GM -GM/160ML PO SOLN: 1.0000 | Freq: Once | ORAL | 0 refills | Status: AC

## 2019-10-30 NOTE — Progress Notes (Signed)
cc'ed to pcp °

## 2019-10-30 NOTE — Assessment & Plan Note (Signed)
58 year old female presents to schedule her first ever colonoscopy.  She does have constipation that has started after a leg injury 3 weeks ago.  Suspect constipation is due to decrease in mobility.  No other significant upper or lower GI symptoms.  No alarm symptoms.  No family history of colon cancer.  Proceed with TCS with propofol with Dr. Oneida Alar in the near future. Proceed with upper endoscopy in the near future with Dr. Gala Romney. The risks, benefits, and alternatives have been discussed in detail with patient. They have stated understanding and desire to proceed.  Propofol due to alcohol use. Add Benefiber or Metamucil daily. Start MiraLAX 1 capful in but the 17 g daily.  Decrease if frequent loose stools. Follow-up as recommended at the time of colonoscopy.

## 2019-10-30 NOTE — Patient Instructions (Addendum)
We will get you scheduled for your colonoscopy in the near future with Dr. Oneida Alar.  For your current constipation, please add Benefiber or Metamucil daily.  These are fiber supplements.  You should also start MiraLAX 1 capful (17 g) daily to help with constipation.  If you start to develop frequent loose stools, you can decrease the frequency of MiraLAX.  Drink plenty of water to keep your urine pale yellow to clear.  See constipation handout below.  We will see you back as recommended at the time of your colonoscopy.  Aliene Altes, PA-C [facility]  Constipation, Adult Constipation is when a person:  Poops (has a bowel movement) fewer times in a week than normal.  Has a hard time pooping.  Has poop that is dry, hard, or bigger than normal. Follow these instructions at home: Eating and drinking   Eat foods that have a lot of fiber, such as: ? Fresh fruits and vegetables. ? Whole grains. ? Beans.  Eat less of foods that are high in fat, low in fiber, or overly processed, such as: ? [place] fries. ? Hamburgers. ? Cookies. ? Candy. ? Soda.  Drink enough fluid to keep your pee (urine) clear or pale yellow. General instructions  Exercise regularly or as told by your doctor.  Go to the restroom when you feel like you need to poop. Do not hold it in.  Take over-the-counter and prescription medicines only as told by your doctor. These include any fiber supplements.  Do pelvic floor retraining exercises, such as: ? Doing deep breathing while relaxing your lower belly (abdomen). ? Relaxing your pelvic floor while pooping.  Watch your condition for any changes.  Keep all follow-up visits as told by your doctor. This is important. Contact a doctor if:  You have pain that gets worse.  You have a fever.  You have not pooped for 4 days.  You throw up (vomit).  You are not hungry.  You lose weight.  You are bleeding from the anus.  You have  thin, pencil-like poop (stool). Get help right away if:  You have a fever, and your symptoms suddenly get worse.  You leak poop or have blood in your poop.  Your belly feels hard or bigger than normal (is bloated).  You have very bad belly pain.  You feel dizzy or you faint. This information is not intended to replace advice given to you by your health care provider. Make sure you discuss any questions you have with your health care provider. Document Released: 05/02/2008 Document Revised: 10/27/2017 Document Reviewed: 05/04/2016 Elsevier Patient Education  2020 [employer].

## 2019-10-30 NOTE — Assessment & Plan Note (Signed)
Likely situational as constipation started after leg injury that has her mobility significantly limited. No alarm symptoms.  Suspect this will resolve as she recovers and is able to get back to her normal daily activities.  Advised she add metamucil or benefiber daily. Start MiraLAX 1 capful (17 g) daily. She was advised if she develops frequent loose stools, she can decrease the frequency of MiraLAX.   Drink enough water to keep urine pale yellow to clear. She is due for screening colonoscopy which we are scheduling at this time.  Follow-up as recommended at the time of colonoscopy.

## 2019-10-31 ENCOUNTER — Encounter: Payer: Self-pay | Admitting: *Deleted

## 2019-10-31 ENCOUNTER — Encounter: Payer: Medicaid Other | Admitting: Obstetrics and Gynecology

## 2019-11-01 DIAGNOSIS — Z7689 Persons encountering health services in other specified circumstances: Secondary | ICD-10-CM | POA: Diagnosis not present

## 2019-11-01 DIAGNOSIS — E559 Vitamin D deficiency, unspecified: Secondary | ICD-10-CM | POA: Diagnosis not present

## 2019-11-01 DIAGNOSIS — G8929 Other chronic pain: Secondary | ICD-10-CM | POA: Diagnosis not present

## 2019-11-05 ENCOUNTER — Telehealth: Payer: Self-pay | Admitting: Obstetrics and Gynecology

## 2019-11-05 DIAGNOSIS — S82141D Displaced bicondylar fracture of right tibia, subsequent encounter for closed fracture with routine healing: Secondary | ICD-10-CM | POA: Diagnosis not present

## 2019-11-05 DIAGNOSIS — Z7689 Persons encountering health services in other specified circumstances: Secondary | ICD-10-CM | POA: Diagnosis not present

## 2019-11-05 NOTE — Telephone Encounter (Signed)

## 2019-11-06 ENCOUNTER — Other Ambulatory Visit (HOSPITAL_COMMUNITY)
Admission: RE | Admit: 2019-11-06 | Discharge: 2019-11-06 | Disposition: A | Discharge status: Home / Self Care | Payer: Medicaid Other | Source: Ambulatory Visit | Attending: Obstetrics and Gynecology | Admitting: Obstetrics and Gynecology

## 2019-11-06 ENCOUNTER — Encounter: Payer: Self-pay | Admitting: Obstetrics and Gynecology

## 2019-11-06 ENCOUNTER — Other Ambulatory Visit: Payer: Self-pay

## 2019-11-06 ENCOUNTER — Ambulatory Visit (INDEPENDENT_AMBULATORY_CARE_PROVIDER_SITE_OTHER): Payer: Medicaid Other | Admitting: Obstetrics and Gynecology

## 2019-11-06 VITALS — BP 129/85 | HR 76 | Ht 62.0 in | Wt 89.2 lb

## 2019-11-06 DIAGNOSIS — Z113 Encounter for screening for infections with a predominantly sexual mode of transmission: Secondary | ICD-10-CM | POA: Diagnosis not present

## 2019-11-06 DIAGNOSIS — Z7689 Persons encountering health services in other specified circumstances: Secondary | ICD-10-CM | POA: Diagnosis not present

## 2019-11-06 DIAGNOSIS — N939 Abnormal uterine and vaginal bleeding, unspecified: Secondary | ICD-10-CM

## 2019-11-06 LAB — POCT WET PREP (WET MOUNT)
Clue Cells Wet Prep Whiff POC: NEGATIVE
Trichomonas Wet Prep HPF POC: ABSENT

## 2019-11-06 LAB — POCT HEMOGLOBIN: Hemoglobin: 13.1 g/dL (ref 11–14.6)

## 2019-11-06 MED ORDER — METRONIDAZOLE 500 MG PO TABS: 500.0000 mg | ORAL_TABLET | Freq: Two times a day (BID) | ORAL | 1 refills | Status: DC

## 2019-11-06 NOTE — Addendum Note (Signed)
Addended by: Rolena Infante H on: 11/06/2019 12:00 PM   Modules accepted: Orders
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
## 2019-11-06 NOTE — Addendum Note (Signed)
Addended by: Armond Hang on: 11/06/2019 12:16 PM   Modules accepted: Orders

## 2019-11-07 LAB — CERVICOVAGINAL ANCILLARY ONLY
Chlamydia: NEGATIVE
Comment: NEGATIVE
Comment: NORMAL
Neisseria Gonorrhea: NEGATIVE

## 2019-11-11 ENCOUNTER — Other Ambulatory Visit: Payer: Self-pay | Admitting: Obstetrics and Gynecology

## 2019-11-11 ENCOUNTER — Telehealth: Payer: Self-pay | Admitting: Obstetrics & Gynecology

## 2019-11-11 DIAGNOSIS — N950 Postmenopausal bleeding: Secondary | ICD-10-CM

## 2019-11-11 NOTE — Telephone Encounter (Signed)

## 2019-11-12 ENCOUNTER — Ambulatory Visit (INDEPENDENT_AMBULATORY_CARE_PROVIDER_SITE_OTHER): Payer: Medicaid Other

## 2019-11-12 ENCOUNTER — Other Ambulatory Visit: Payer: Self-pay

## 2019-11-12 DIAGNOSIS — Z7689 Persons encountering health services in other specified circumstances: Secondary | ICD-10-CM | POA: Diagnosis not present

## 2019-11-12 DIAGNOSIS — N950 Postmenopausal bleeding: Secondary | ICD-10-CM

## 2019-11-12 NOTE — Progress Notes (Signed)
PELVIC US TA/TV:atrophic homogeneous anteverted uterus, complex fluid filled endometrium (hematometra),EEC 1.5 mm,normal ovaries bilat,no free fluid,ovaries appear mobile,no pain during ultrasound

## 2019-11-15 ENCOUNTER — Telehealth: Payer: Self-pay | Admitting: Obstetrics and Gynecology

## 2019-11-15 NOTE — Telephone Encounter (Signed)
Called patient regarding appointment and the following message was left:   We have you scheduled for an upcoming appointment at our office. At this time, we are still not allowing visitors or children during the appointment, however, a support person, over age 57, may accompany you to your appointment if assistance is needed for safety or care concerns. Otherwise, support persons should remain outside until the visit is complete.   We ask if you have had any exposure to anyone suspected or confirmed of having COVID-19, are awaiting test results for COVID-19 or if you are experiencing any of the following, to call and reschedule your appointment: fever, cough, shortness of breath, muscle pain, diarrhea, rash, vomiting, abdominal pain, red eye, weakness, bruising, bleeding, joint pain, or a severe headache.   Please know we will ask you these questions or similar questions when you arrive for your appointment and again it's how we are keeping everyone safe.    Also,to keep you safe, please use the provided hand sanitizer when you enter the office. We are asking everyone in the office to wear a mask to help prevent the spread of germs. If you have a mask of your own, please wear it to your appointment, if not, we are happy to provide one for you.  Thank you for understanding and your cooperation.    [facility] Staff      

## 2019-11-18 ENCOUNTER — Other Ambulatory Visit: Payer: Self-pay | Admitting: Obstetrics and Gynecology

## 2019-11-18 ENCOUNTER — Encounter: Payer: Self-pay | Admitting: Obstetrics and Gynecology

## 2019-11-18 ENCOUNTER — Other Ambulatory Visit: Payer: Self-pay

## 2019-11-18 ENCOUNTER — Ambulatory Visit (INDEPENDENT_AMBULATORY_CARE_PROVIDER_SITE_OTHER): Payer: Medicaid Other | Admitting: Obstetrics and Gynecology

## 2019-11-18 VITALS — Ht 62.0 in

## 2019-11-18 DIAGNOSIS — N857 Hematometra: Secondary | ICD-10-CM

## 2019-11-18 NOTE — Progress Notes (Addendum)
Patient ID: Beth [name], female   DOB: 05-01-1961, 58 y.o.   MRN: [id number]    TELEHEALTH VIRTUAL GYNECOLOGY VISIT ENCOUNTER NOTE  I connected with [name] on 11/18/2019 at 11:10 AM EST by telephone at home and verified that I am speaking with the correct person using two identifiers.   I discussed the limitations, risks, security and privacy concerns of performing an evaluation and management service by telephone and the availability of in person appointments. I also discussed with the patient that there may be a patient responsible charge related to this service. The patient expressed understanding and agreed to proceed.   History:  Beth [name] is a 58 y.o. G0P0000 female being evaluated today for gyn follow up. She denies any abnormal vaginal discharge, bleeding, pelvic pain or other concerns she is having a light brown discharge, no fever and no malodor.  She completed the antibiotics we gave her at the time of the previous visit and feels fine.       Past Medical History:  Diagnosis Date  . Brain bleed ([place]) 2012  . Cervical cancer ([place])   . COPD (chronic obstructive pulmonary disease) ([place])   . Lung cancer ([place]) 2008   reports one small spot seen on her lungs in the past. Denies daignosis of cancer.   . Lung infection    Past Surgical History:  Procedure Laterality Date  . rod in arm     The following portions of the patient's history were reviewed and updated as appropriate: allergies, current medications, past family history, past medical history, past social history, past surgical history and problem list.   Review of Systems:  Pertinent items noted in HPI and remainder of comprehensive ROS otherwise negative.  She denies abdominal pain.  Bladder is functioning normally  Physical Exam:   General:  Alert, oriented and cooperative.   Mental Status: Normal mood and affect perceived. Normal judgment and thought content.  Physical exam deferred due to nature of the  encounter  Labs and Imaging Results for orders placed or performed in visit on 11/06/19 (from the past 336 hour(s))  POCT hemoglobin   Collection Time: 11/06/19 10:48 AM  Result Value Ref Range   Hemoglobin 13.1 11 - 14.6 g/dL  Cervicovaginal ancillary only( [place])   Collection Time: 11/06/19 12:00 PM  Result Value Ref Range   Neisseria Gonorrhea Negative    Chlamydia Negative    Comment Normal Reference [place] Chlamydia - Negative    Comment      Normal Reference Range Neisseria Gonorrhea - Negative  POCT Wet Prep [facility] [place])   Collection Time: 11/06/19 12:14 PM  Result Value Ref Range   Source Wet Prep POC     WBC, Wet Prep HPF POC numerous    Bacteria Wet Prep HPF POC None (A) Few   BACTERIA WET PREP MORPHOLOGY POC     Clue Cells Wet Prep HPF POC None None   Clue Cells Wet Prep Whiff POC Negative Whiff    Yeast Wet Prep HPF POC None None   KOH Wet Prep POC None None   Trichomonas Wet Prep HPF POC Absent Absent   US Transvaginal Non-OB  Result Date: 11/15/2019 GYNECOLOGIC SONOGRAM Beth [name] is a 58 y.o. G0P0000 she is here for a pelvic sonogram for postmenopausal bleeding. Uterus                      5 x 2.8 x 4.4 cm, Total uterine volume 32 cc,atrophic  homogeneous anteverted uterus Endometrium          1.5 mm, symmetrical, complex fluid filled endometrium (hematometra) Right ovary             1.3 x 1.2 x 1.2 cm, wnl Left ovary                1.8 x 1.2 x 1.2 cm, wnl No free fluid Technician Comments: PELVIC US TA/TV:atrophic homogeneous anteverted uterus, complex fluid filled endometrium (hematometra),EEC 1.5 mm,normal ovaries bilat,no free fluid,ovaries appear mobile,no pain during ultrasound [facility] [address] Heide Guile 11/12/2019 9:59 AM Clinical Impression and recommendations: I have reviewed the sonogram results above, combined with the patient's current clinical course, below are my impressions and any appropriate recommendations for management based on the sonographic  findings. Uterus is normal in size shape and contour Endometrium itself is normal but there is a hematometra present which represents the source of PMB Both ovaries are normal Florian Buff 11/15/2019 7:40 PM  US Pelvis Complete  Result Date: 11/15/2019 GYNECOLOGIC SONOGRAM Beth [name] is a 58 y.o. G0P0000 she is here for a pelvic sonogram for postmenopausal bleeding. Uterus                      5 x 2.8 x 4.4 cm, Total uterine volume 32 cc,atrophic homogeneous anteverted uterus Endometrium          1.5 mm, symmetrical, complex fluid filled endometrium (hematometra) Right ovary             1.3 x 1.2 x 1.2 cm, wnl Left ovary                1.8 x 1.2 x 1.2 cm, wnl No free fluid Technician Comments: PELVIC US TA/TV:atrophic homogeneous anteverted uterus, complex fluid filled endometrium (hematometra),EEC 1.5 mm,normal ovaries bilat,no free fluid,ovaries appear mobile,no pain during ultrasound [facility] [address] Heide Guile 11/12/2019 9:59 AM Clinical Impression and recommendations: I have reviewed the sonogram results above, combined with the patient's current clinical course, below are my impressions and any appropriate recommendations for management based on the sonographic findings. Uterus is normal in size shape and contour Endometrium itself is normal but there is a hematometra present which represents the source of PMB Both ovaries are normal Florian Buff 11/15/2019 7:40 PM    The endometrium is filled with a heterogeneous tissue consistent with a hematometra and in 1 picture it is 3.6 cm longitudinal by 1.5 cm AP thickness.  The endometrial surfaces appear thin and atrophic.  There are no solid areas Assessment and Plan:    Postmenopausal bleeding in someone found to have hematometra.  Thin endometrial surface noted.   My recommendation is that we see her in the office, do cervical dilation and try to aspirate as much of the endometrial hematometra as possible.  This will be scheduled for January   I  discussed the assessment and treatment plan with the patient. The patient was provided an opportunity to ask questions and all were answered. The patient agreed with the plan and demonstrated an understanding of the instructions.   The patient was advised to call back or seek an in-person evaluation/go to the ED if the symptoms worsen or if the condition fails to improve as anticipated.  I provided 8 minutes of non-face-to-face time during this encounter, plus documentation time and chart review. Total 15 min   By signing my name below, I, Samul Dada, attest that this documentation has been prepared under  the direction and in the presence of Jonnie Kind, MD. Electronically Signed: [place]. 11/18/19. 11:44 AM.  I personally performed the services described in this documentation, which was SCRIBED in my presence. The recorded information has been reviewed and considered accurate. It has been edited as necessary during review. Jonnie Kind, MD

## 2019-11-19 DIAGNOSIS — S82141D Displaced bicondylar fracture of right tibia, subsequent encounter for closed fracture with routine healing: Secondary | ICD-10-CM | POA: Diagnosis not present

## 2019-12-02 DIAGNOSIS — Z7689 Persons encountering health services in other specified circumstances: Secondary | ICD-10-CM | POA: Diagnosis not present

## 2019-12-03 DIAGNOSIS — Z7689 Persons encountering health services in other specified circumstances: Secondary | ICD-10-CM | POA: Diagnosis not present

## 2019-12-17 DIAGNOSIS — Z7689 Persons encountering health services in other specified circumstances: Secondary | ICD-10-CM | POA: Diagnosis not present

## 2019-12-30 DIAGNOSIS — R5382 Chronic fatigue, unspecified: Secondary | ICD-10-CM | POA: Diagnosis not present

## 2019-12-30 DIAGNOSIS — Z7689 Persons encountering health services in other specified circumstances: Secondary | ICD-10-CM | POA: Diagnosis not present

## 2019-12-30 DIAGNOSIS — M2550 Pain in unspecified joint: Secondary | ICD-10-CM | POA: Diagnosis not present

## 2020-01-07 DIAGNOSIS — Z7689 Persons encountering health services in other specified circumstances: Secondary | ICD-10-CM | POA: Diagnosis not present

## 2020-01-08 ENCOUNTER — Telehealth: Payer: Self-pay | Admitting: Obstetrics and Gynecology

## 2020-01-08 NOTE — Telephone Encounter (Signed)

## 2020-01-09 ENCOUNTER — Encounter: Payer: Self-pay | Admitting: Obstetrics and Gynecology

## 2020-01-09 ENCOUNTER — Other Ambulatory Visit: Payer: Self-pay

## 2020-01-09 ENCOUNTER — Ambulatory Visit: Payer: Medicaid Other | Admitting: Obstetrics and Gynecology

## 2020-01-09 ENCOUNTER — Other Ambulatory Visit: Payer: Self-pay | Admitting: Obstetrics and Gynecology

## 2020-01-09 VITALS — BP 121/85 | HR 92 | Ht 62.0 in | Wt 90.8 lb

## 2020-01-09 DIAGNOSIS — N711 Chronic inflammatory disease of uterus: Secondary | ICD-10-CM | POA: Diagnosis not present

## 2020-01-09 DIAGNOSIS — N950 Postmenopausal bleeding: Secondary | ICD-10-CM

## 2020-01-09 DIAGNOSIS — Z7689 Persons encountering health services in other specified circumstances: Secondary | ICD-10-CM | POA: Diagnosis not present

## 2020-01-09 DIAGNOSIS — N719 Inflammatory disease of uterus, unspecified: Secondary | ICD-10-CM | POA: Diagnosis not present

## 2020-01-09 MED ORDER — METRONIDAZOLE 500 MG PO TABS: 500.0000 mg | ORAL_TABLET | Freq: Two times a day (BID) | ORAL | 0 refills | Status: AC

## 2020-01-09 MED ORDER — DOXYCYCLINE HYCLATE 100 MG PO CAPS: 100.0000 mg | ORAL_CAPSULE | Freq: Two times a day (BID) | ORAL | 0 refills | Status: DC

## 2020-01-09 NOTE — Progress Notes (Addendum)
Patient ID: [name], female   DOB: 1961/01/30, 59 y.o.   MRN: [id number]    [facility] Visit  @DATE @            Patient name: Beth [name] MRN [id number]  Date of birth: May 09, 1961  CC & HPI:  Beth [name] is a 60 y.o. female presenting today for postmenopausal vaginal discharge and postmenopausal bleeding. She has a history of "cervical cancer". She denies having any fevers.  She denies recent bleeding but has increased d/c She still smokes and hasn't mentioned any plans to quit.   ROS:  ROS + vaginal spotting -cramping -fever -chills  All systems are negative except as noted in the HPI and PMH.   Pertinent History Reviewed:   Reviewed: Medical         Past Medical History:  Diagnosis Date  . Brain bleed ([place]) 2012  . Cervical cancer ([place])   . COPD (chronic obstructive pulmonary disease) ([place])   . Lung cancer ([place]) 2008   reports one small spot seen on her lungs in the past. Denies daignosis of cancer.   . Lung infection                               Surgical Hx:    Past Surgical History:  Procedure Laterality Date  . rod in arm     Medications: Reviewed & Updated - see associated section                       Current Outpatient Medications:  .  albuterol (PROVENTIL HFA;VENTOLIN HFA) 108 (90 BASE) MCG/ACT inhaler, Inhale 1-2 puffs into the lungs every 6 (six) hours as needed for wheezing or shortness of breath., Disp: , Rfl:  .  B Complex-C (B-COMPLEX WITH VITAMIN C) tablet, Take 1 tablet by mouth daily., Disp: , Rfl:  .  Cholecalciferol (VITAMIN D3) 75 MCG (3000 UT) TABS, Take by mouth daily., Disp: , Rfl:  .  ibuprofen (ADVIL) 200 MG tablet, Take 400 mg by mouth as needed., Disp: , Rfl:  .  MAGNESIUM PO, Take by mouth daily., Disp: , Rfl:  .  metroNIDAZOLE (FLAGYL) 500 MG tablet, TAKE 1 TABLET BY MOUTH TWICE DAILY, Disp: 14 tablet, Rfl: 1 .  traMADol (ULTRAM) 50 MG tablet, TAKE 1 TABLET BY MOUTH EVERY 4 HOURS AS NEEDED FOR PAIN, Disp: , Rfl:    Social  History: Reviewed -  reports that she has been smoking cigarettes. She has a 35.00 pack-year smoking history. She has never used smokeless tobacco.  Objective Findings:  Vitals: Last menstrual period 10/08/2019.  PHYSICAL EXAMINATION General appearance - alert, well appearing, and in no distress Mental status - normal mood, behavior, speech, dress, motor activity, and thought processes, affect appropriate to mood  PELVIC Patient given informed consent, signed copy in the chart, time out was performed. Appropriate time out taken. . The patient was placed in the lithotomy position and the cervix brought into view with sterile speculum.  Portio of cervix cleansed x 2 with betadine swabs.  A tenaculum was placed in the anterior lip of the cervix.  The uterus was sounded for depth of 6 cm. A pipelle was introduced to into the uterus, suction created,  and an endometrial sample was obtained and 9 cc of purulent material with light blood at end of aspiration. All equipment was removed and accounted for.  Samples sent for endometrial  biopsy, the earlier portion sent for gram stain and aerobic/anaerobic cultures. The patient tolerated the procedure well.   Patient given post procedure instructions. The patient will return in 2 weeks for results.  Assessment & Plan:   A:  1. postmenopausal bleeding pyometra 2. ENDO biopsy  P:  1. f/u phone call with results 2. Metronidazole.500 bid x 7 d    By signing my name below, I, Samul Dada, attest that this documentation has been prepared under the direction and in the presence of Jonnie Kind, MD. Electronically Signed: [place]. 01/09/20. 10:13 AM.  I personally performed the services described in this documentation, which was SCRIBED in my presence. The recorded information has been reviewed and considered accurate. It has been edited as necessary during review. Jonnie Kind, MD

## 2020-01-13 ENCOUNTER — Telehealth: Payer: Self-pay | Admitting: Obstetrics and Gynecology

## 2020-01-13 LAB — AEROBIC CULTURE

## 2020-01-13 LAB — ANAEROBIC CULTURE

## 2020-01-13 NOTE — Telephone Encounter (Signed)
Left message for pt : benign biopsy and anaerobes on culture, mixed flora. Should respond to Metronidazole. Pt encouraged to call for f/u if having continued bleeding or d/c.

## 2020-01-13 NOTE — Progress Notes (Signed)
Anaerobic organisms seen ,on pyometra culture, treated at present with Metronidazole.

## 2020-01-13 NOTE — Progress Notes (Signed)
Call for appt n 2-3 wk

## 2020-01-13 NOTE — Progress Notes (Signed)
No cancer or precancer on biopsy of pyometra . Suppurative material only. Treated with metronidazole.

## 2020-01-21 ENCOUNTER — Telehealth: Payer: Self-pay | Admitting: Obstetrics and Gynecology

## 2020-01-21 NOTE — Telephone Encounter (Signed)

## 2020-01-22 ENCOUNTER — Ambulatory Visit: Payer: Medicaid Other | Admitting: Obstetrics and Gynecology

## 2020-01-22 ENCOUNTER — Encounter: Payer: Self-pay | Admitting: Obstetrics and Gynecology

## 2020-01-22 ENCOUNTER — Other Ambulatory Visit: Payer: Self-pay

## 2020-01-22 VITALS — BP 136/86 | HR 82 | Ht 62.0 in | Wt 88.4 lb

## 2020-01-22 DIAGNOSIS — N711 Chronic inflammatory disease of uterus: Secondary | ICD-10-CM

## 2020-01-22 DIAGNOSIS — Z7689 Persons encountering health services in other specified circumstances: Secondary | ICD-10-CM | POA: Diagnosis not present

## 2020-01-22 MED ORDER — METRONIDAZOLE 500 MG PO TABS: 500.0000 mg | ORAL_TABLET | Freq: Two times a day (BID) | ORAL | 0 refills | Status: AC

## 2020-01-22 NOTE — Progress Notes (Signed)
Patient ID: George Alcantar, female   DOB: 10/10/1961, 59 y.o.   MRN: [id number]    [facility] Visit  @DATE @            Patient name: Beth [name] MRN [id number]  Date of birth: [birth year]  CC & HPI:  Beth [name] is a 59 y.o. female presenting today for follow-up of postmenopausal bleeding. The patient was placed on Metronidazole after last visit. Since then, she states that her vaginal bleeding/discharge has almost completely resolved. Denies fever.  ROS:  ROS   +discharge (significantly improved) -fever  All systems are negative except as noted in the HPI and PMH.   Pertinent History Reviewed:   Reviewed: Significant for cervical cancer.  Medical         Past Medical History:  Diagnosis Date  . Brain bleed ([place]) 2012  . Cervical cancer ([place])   . COPD (chronic obstructive pulmonary disease) ([place])   . Lung cancer ([place]) 2008   reports one small spot seen on her lungs in the past. Denies daignosis of cancer.   . Lung infection                               Surgical Hx:    Past Surgical History:  Procedure Laterality Date  . rod in arm     Medications: Reviewed & Updated - see associated section                       Current Outpatient Medications:  .  albuterol (PROVENTIL HFA;VENTOLIN HFA) 108 (90 BASE) MCG/ACT inhaler, Inhale 1-2 puffs into the lungs every 6 (six) hours as needed for wheezing or shortness of breath., Disp: , Rfl:  .  B Complex-C (B-COMPLEX WITH VITAMIN C) tablet, Take 1 tablet by mouth daily., Disp: , Rfl:  .  Cholecalciferol (VITAMIN D3) 75 MCG (3000 UT) TABS, Take by mouth daily., Disp: , Rfl:  .  ibuprofen (ADVIL) 200 MG tablet, Take 400 mg by mouth as needed., Disp: , Rfl:  .  MAGNESIUM PO, Take by mouth daily., Disp: , Rfl:  .  traMADol (ULTRAM) 50 MG tablet, TAKE 1 TABLET BY MOUTH EVERY 4 HOURS AS NEEDED FOR PAIN, Disp: , Rfl:    Social History: Reviewed -  reports that she has been smoking cigarettes. She has a 35.00 pack-year smoking history.  She has never used smokeless tobacco.  Objective Findings:  Vitals: Blood pressure 136/86, pulse 82, height 5\' 2"  (1.575 m), weight 88 lb 6.4 oz (40.1 kg), last menstrual period 10/08/2019.  PHYSICAL EXAMINATION General appearance - alert, well appearing, and in no distress and oriented to person, place, and time. Mental status - normal mood, behavior, speech, dress, motor activity, and thought processes, depressed mood. Skin - infected hair follicle on the left buttock.  PELVIC External genitalia - Normal Vagina - Minimal light yellow discharge that has significantly improved  Chaperoned by Clerance Lav.   Assessment & Plan:   A:  1.  Postmenopausal bleeding 2 pyometra, improved or resolved. P:  1.  Will prescribe an additional week of metronidazole. Patient will return in two weeks for pelvic ultrasound.   By signing my name below, I, Clerance Lav, attest that this documentation has been prepared under the direction and in the presence of Jonnie Kind, MD. Electronically Signed: [place]. 01/22/20. 10:25 AM.  I personally performed the services described in  this documentation, which was SCRIBED in my presence. The recorded information has been reviewed and considered accurate. It has been edited as necessary during review. Jonnie Kind, MD

## 2020-01-27 DIAGNOSIS — Z7689 Persons encountering health services in other specified circumstances: Secondary | ICD-10-CM | POA: Diagnosis not present

## 2020-01-27 DIAGNOSIS — R5382 Chronic fatigue, unspecified: Secondary | ICD-10-CM | POA: Diagnosis not present

## 2020-01-27 DIAGNOSIS — M545 Low back pain: Secondary | ICD-10-CM | POA: Diagnosis not present

## 2020-01-27 DIAGNOSIS — M25561 Pain in right knee: Secondary | ICD-10-CM | POA: Diagnosis not present

## 2020-02-03 ENCOUNTER — Encounter (HOSPITAL_COMMUNITY): Payer: Self-pay

## 2020-02-03 ENCOUNTER — Other Ambulatory Visit: Payer: Self-pay

## 2020-02-03 ENCOUNTER — Encounter (HOSPITAL_COMMUNITY)
Admission: RE | Admit: 2020-02-03 | Discharge: 2020-02-03 | Disposition: A | Discharge status: Home / Self Care | Payer: Medicaid Other | Source: Ambulatory Visit | Attending: Gastroenterology | Admitting: Gastroenterology

## 2020-02-03 ENCOUNTER — Other Ambulatory Visit (HOSPITAL_COMMUNITY)
Admission: RE | Admit: 2020-02-03 | Discharge: 2020-02-03 | Disposition: A | Discharge status: Home / Self Care | Payer: Medicaid Other | Source: Ambulatory Visit | Attending: Gastroenterology | Admitting: Gastroenterology

## 2020-02-03 DIAGNOSIS — Z20822 Contact with and (suspected) exposure to covid-19: Secondary | ICD-10-CM | POA: Insufficient documentation

## 2020-02-03 DIAGNOSIS — Z01812 Encounter for preprocedural laboratory examination: Secondary | ICD-10-CM | POA: Diagnosis not present

## 2020-02-03 LAB — PREGNANCY, URINE: Preg Test, Ur: NEGATIVE

## 2020-02-03 NOTE — Patient Instructions (Addendum)
33    Your procedure is scheduled on: 02/04/2020  Report to [facility] at    6:45 AM.  Call this number if you have problems the morning of surgery: [phone]   Remember:  Follow instructions from Dr Oneida Alar office.        No Smoking the day of procedure      Take these medicines the morning of surgery with A SIP OF WATER: Tramadol and use inhalers   Do not wear jewelry, make-up or nail polish.  Do not wear lotions, powders, or perfumes. You may wear deodorant.                Do not bring valuables to the hospital.  Contacts, dentures or bridgework may not be worn into surgery.  Leave suitcase in the car. After surgery it may be brought to your room.  For patients admitted to the hospital, checkout time is 11:00 AM the day of discharge.   Patients discharged the day of surgery will not be allowed to drive home.  Colonoscopy, Adult, Care After This sheet gives you information about how to care for yourself after your procedure. Your health care provider may also give you more specific instructions. If you have problems or questions, contact your health care provider. What can I expect after the procedure? After the procedure, it is common to have:  A small amount of blood in your stool for 24 hours after the procedure.  Some gas.  Mild cramping or bloating of your abdomen. Follow these instructions at home: Eating and drinking   Drink enough fluid to keep your urine pale yellow.  Follow instructions from your health care provider about eating or drinking restrictions.  Resume your normal diet as instructed by your health care provider. Avoid heavy or fried foods that are hard to digest. Activity  Rest as told by your health care provider.  Avoid sitting for a long time without moving. Get up to take short walks every 1-2 hours. This is important to improve blood flow and breathing. Ask for help if you feel weak or unsteady.  Return to your normal activities as told by your  health care provider. Ask your health care provider what activities are safe for you. Managing cramping and bloating   Try walking around when you have cramps or feel bloated.  Apply heat to your abdomen as told by your health care provider. Use the heat source that your health care provider recommends, such as a moist heat pack or a heating pad. ? Place a towel between your skin and the heat source. ? Leave the heat on for 20-30 minutes. ? Remove the heat if your skin turns bright red. This is especially important if you are unable to feel pain, heat, or cold. You may have a greater risk of getting burned. General instructions  For the first 24 hours after the procedure: ? Do not drive or use machinery. ? Do not sign important documents. ? Do not drink alcohol. ? Do your regular daily activities at a slower pace than normal. ? Eat soft foods that are easy to digest.  Take over-the-counter and prescription medicines only as told by your health care provider.  Keep all follow-up visits as told by your health care provider. This is important. Contact a health care provider if:  You have blood in your stool 2-3 days after the procedure. Get help right away if you have:  More than a small spotting of blood in  your stool.  Large blood clots in your stool.  Swelling of your abdomen.  Nausea or vomiting.  A fever.  Increasing pain in your abdomen that is not relieved with medicine. Summary  After the procedure, it is common to have a small amount of blood in your stool. You may also have mild cramping and bloating of your abdomen.  For the first 24 hours after the procedure, do not drive or use machinery, sign important documents, or drink alcohol.  Get help right away if you have a lot of blood in your stool, nausea or vomiting, a fever, or increased pain in your abdomen. This information is not intended to replace advice given to you by your health care provider. Make sure you  discuss any questions you have with your health care provider. Document Revised: 06/10/2019 Document Reviewed: 06/10/2019 Elsevier Patient Education  [place].

## 2020-02-04 ENCOUNTER — Ambulatory Visit (HOSPITAL_COMMUNITY): Payer: Medicaid Other | Admitting: Certified Registered Nurse Anesthetist

## 2020-02-04 ENCOUNTER — Encounter (HOSPITAL_COMMUNITY)
Admission: RE | Disposition: A | Discharge status: Home / Self Care | Payer: Self-pay | Source: Ambulatory Visit | Attending: Gastroenterology

## 2020-02-04 ENCOUNTER — Ambulatory Visit (HOSPITAL_COMMUNITY)
Admission: RE | Admit: 2020-02-04 | Discharge: 2020-02-04 | Disposition: A | Discharge status: Home / Self Care | Payer: Medicaid Other | Source: Ambulatory Visit | Attending: Gastroenterology | Admitting: Gastroenterology

## 2020-02-04 ENCOUNTER — Encounter (HOSPITAL_COMMUNITY): Payer: Self-pay | Admitting: Gastroenterology

## 2020-02-04 ENCOUNTER — Telehealth: Payer: Self-pay | Admitting: Obstetrics and Gynecology

## 2020-02-04 DIAGNOSIS — K644 Residual hemorrhoidal skin tags: Secondary | ICD-10-CM | POA: Diagnosis not present

## 2020-02-04 DIAGNOSIS — K648 Other hemorrhoids: Secondary | ICD-10-CM | POA: Insufficient documentation

## 2020-02-04 DIAGNOSIS — Z800 Family history of malignant neoplasm of digestive organs: Secondary | ICD-10-CM | POA: Insufficient documentation

## 2020-02-04 DIAGNOSIS — Z79899 Other long term (current) drug therapy: Secondary | ICD-10-CM | POA: Insufficient documentation

## 2020-02-04 DIAGNOSIS — Z885 Allergy status to narcotic agent status: Secondary | ICD-10-CM | POA: Insufficient documentation

## 2020-02-04 DIAGNOSIS — F17210 Nicotine dependence, cigarettes, uncomplicated: Secondary | ICD-10-CM | POA: Insufficient documentation

## 2020-02-04 DIAGNOSIS — Z85118 Personal history of other malignant neoplasm of bronchus and lung: Secondary | ICD-10-CM | POA: Insufficient documentation

## 2020-02-04 DIAGNOSIS — Z8541 Personal history of malignant neoplasm of cervix uteri: Secondary | ICD-10-CM | POA: Diagnosis not present

## 2020-02-04 DIAGNOSIS — Z1211 Encounter for screening for malignant neoplasm of colon: Secondary | ICD-10-CM | POA: Insufficient documentation

## 2020-02-04 DIAGNOSIS — J449 Chronic obstructive pulmonary disease, unspecified: Secondary | ICD-10-CM | POA: Diagnosis not present

## 2020-02-04 DIAGNOSIS — K635 Polyp of colon: Secondary | ICD-10-CM

## 2020-02-04 DIAGNOSIS — Z801 Family history of malignant neoplasm of trachea, bronchus and lung: Secondary | ICD-10-CM | POA: Diagnosis not present

## 2020-02-04 DIAGNOSIS — K5730 Diverticulosis of large intestine without perforation or abscess without bleeding: Secondary | ICD-10-CM | POA: Diagnosis not present

## 2020-02-04 DIAGNOSIS — Z8679 Personal history of other diseases of the circulatory system: Secondary | ICD-10-CM | POA: Insufficient documentation

## 2020-02-04 DIAGNOSIS — D120 Benign neoplasm of cecum: Secondary | ICD-10-CM | POA: Insufficient documentation

## 2020-02-04 DIAGNOSIS — Q438 Other specified congenital malformations of intestine: Secondary | ICD-10-CM | POA: Diagnosis not present

## 2020-02-04 HISTORY — PX: POLYPECTOMY: SHX5525

## 2020-02-04 HISTORY — PX: COLONOSCOPY WITH PROPOFOL: SHX5780

## 2020-02-04 HISTORY — PX: BIOPSY: SHX5522

## 2020-02-04 LAB — SARS CORONAVIRUS 2 (TAT 6-24 HRS): SARS Coronavirus 2: NEGATIVE

## 2020-02-04 SURGERY — COLONOSCOPY WITH PROPOFOL
Anesthesia: Monitor Anesthesia Care

## 2020-02-04 MED ORDER — LIDOCAINE 2% (20 MG/ML) 5 ML SYRINGE
INTRAMUSCULAR | Status: AC
  Filled 2020-02-04: qty 5

## 2020-02-04 MED ORDER — SODIUM CHLORIDE (PF) 0.9 % IJ SOLN
PREFILLED_SYRINGE | INTRAMUSCULAR | Status: DC | PRN
  Administered 2020-02-04: 3 mL

## 2020-02-04 MED ORDER — CHLORHEXIDINE GLUCONATE CLOTH 2 % EX PADS: 6.0000 | MEDICATED_PAD | Freq: Once | CUTANEOUS | Status: DC

## 2020-02-04 MED ORDER — PROPOFOL 10 MG/ML IV BOLUS
INTRAVENOUS | Status: AC
  Filled 2020-02-04: qty 40

## 2020-02-04 MED ORDER — PROPOFOL 10 MG/ML IV BOLUS
INTRAVENOUS | Status: DC | PRN
  Administered 2020-02-04: 20 mg via INTRAVENOUS
  Administered 2020-02-04: 30 mg via INTRAVENOUS
  Administered 2020-02-04: 20 mg via INTRAVENOUS
  Administered 2020-02-04: 30 mg via INTRAVENOUS

## 2020-02-04 MED ORDER — LACTATED RINGERS IV SOLN
INTRAVENOUS | Status: DC
  Administered 2020-02-04: 08:00:00 1000 mL via INTRAVENOUS

## 2020-02-04 MED ORDER — PROPOFOL 500 MG/50ML IV EMUL
INTRAVENOUS | Status: DC | PRN
  Administered 2020-02-04: 200 ug/kg/min via INTRAVENOUS

## 2020-02-04 MED ORDER — LIDOCAINE HCL (CARDIAC) PF 100 MG/5ML IV SOSY
PREFILLED_SYRINGE | INTRAVENOUS | Status: DC | PRN
  Administered 2020-02-04: 40 mg via INTRATRACHEAL

## 2020-02-04 MED ORDER — EPINEPHRINE 1 MG/10ML IJ SOSY
PREFILLED_SYRINGE | INTRAMUSCULAR | Status: AC
  Filled 2020-02-04: qty 10

## 2020-02-04 NOTE — Discharge Instructions (Signed)
You have internal and external hemorrhoid. You have mild diverticulosis IN YOUR LEFT COLON. YOU HAD ONE LARGE POLYP REMOVED FROM THE RIGHT COLON. I PLACED FOUR METAL CLIPS TO PREVENT BLEEDING IN 7-10 DAYS.    IF YOU NEED AN MRI, LET THE RADIOLOGY TECH KNOW THAT YOU HAD FOUR CLIPS PLACED IN YOUR COLON. THEY SHOULD FALL OFF IN 30 DAYS.   DRINK WATER TO KEEP YOUR URINE LIGHT YELLOW.  FOLLOW A HIGH FIBER DIET. AVOID ITEMS THAT CAUSE BLOATING. See info below.  USE PREPARATION H FOUR TIMES  A DAY IF NEEDED TO RELIEVE RECTAL PAIN/PRESSURE/BLEEDING.   YOUR BIOPSY RESULTS WILL BE BACK IN 5 BUSINESS DAYS.  YOUR next colonoscopy WILL BE SCHEDULED BASED ON YOUR FINAL PATHOLOGY REPORT.   Colonoscopy Care After Read the instructions outlined below and refer to this sheet in the next week. These discharge instructions provide you with general information on caring for yourself after you leave the hospital. While your treatment has been planned according to the most current medical practices available, unavoidable complications occasionally occur. If you have any problems or questions after discharge, call DR. Genevive Printup, [phone].  ACTIVITY  You may resume your regular activity, but move at a slower pace for the next 24 hours.   Take frequent rest periods for the next 24 hours.   Walking will help get rid of the air and reduce the bloated feeling in your belly (abdomen).   No driving for 24 hours (because of the medicine (anesthesia) used during the test).   You may shower.   Do not sign any important legal documents or operate any machinery for 24 hours (because of the anesthesia used during the test).    NUTRITION  Drink plenty of fluids.   You may resume your normal diet as instructed by your doctor.   Begin with a light meal and progress to your normal diet. Heavy or fried foods are harder to digest and may make you feel sick to your stomach (nauseated).   Avoid alcoholic beverages  for 24 hours or as instructed.    MEDICATIONS  You may resume your normal medications.   WHAT YOU CAN EXPECT TODAY  Some feelings of bloating in the abdomen.   Passage of more gas than usual.   Spotting of blood in your stool or on the toilet paper  .  IF YOU HAD POLYPS REMOVED DURING THE COLONOSCOPY:  Eat a soft diet IF YOU HAVE NAUSEA, BLOATING, ABDOMINAL PAIN, OR VOMITING.    FINDING OUT THE RESULTS OF YOUR TEST Not all test results are available during your visit. DR. Oneida Alar WILL CALL YOU WITHIN 14 DAYS OF YOUR PROCEDUE WITH YOUR RESULTS. Do not assume everything is normal if you have not heard from DR. Blaine Guiffre, CALL HER OFFICE AT [phone].  SEEK IMMEDIATE MEDICAL ATTENTION AND CALL THE OFFICE: [phone] IF:  You have more than a spotting of blood in your stool.   Your belly is swollen (abdominal distention).   You are nauseated or vomiting.   You have a temperature over 101F.   You have abdominal pain or discomfort that is severe or gets worse throughout the day.  High-Fiber Diet A high-fiber diet changes your normal diet to include more whole grains, legumes, fruits, and vegetables. Changes in the diet involve replacing refined carbohydrates with unrefined foods. The calorie level of the diet is essentially unchanged. The Dietary Reference Intake (recommended amount) for adult males is 38 grams per day. For adult females, it is  25 grams per day. Pregnant and lactating women should consume 28 grams of fiber per day. Fiber is the intact part of a plant that is not broken down during digestion. Functional fiber is fiber that has been isolated from the plant to provide a beneficial effect in the body. PURPOSE  Increase stool bulk.   Ease and regulate bowel movements.   Lower cholesterol.   REDUCE RISK OF COLON CANCER  INDICATIONS THAT YOU NEED MORE FIBER  Constipation and hemorrhoids.   Uncomplicated diverticulosis (intestine condition) and irritable  bowel syndrome.   Weight management.   As a protective measure against hardening of the arteries (atherosclerosis), diabetes, and cancer.   GUIDELINES FOR INCREASING FIBER IN THE DIET  Start adding fiber to the diet slowly. A gradual increase of about 5 more grams (2 slices of whole-wheat bread, 2 servings of most fruits or vegetables, or 1 bowl of high-fiber cereal) per day is best. Too rapid an increase in fiber may result in constipation, flatulence, and bloating.   Drink enough water and fluids to keep your urine clear or pale yellow. Water, juice, or caffeine-free drinks are recommended. Not drinking enough fluid may cause constipation.   Eat a variety of high-fiber foods rather than one type of fiber.   Try to increase your intake of fiber through using high-fiber foods rather than fiber pills or supplements that contain small amounts of fiber.   The goal is to change the types of food eaten. Do not supplement your present diet with high-fiber foods, but replace foods in your present diet.   INCLUDE A VARIETY OF FIBER SOURCES  Replace refined and processed grains with whole grains, canned fruits with fresh fruits, and incorporate other fiber sources. White rice, white breads, and most bakery goods contain little or no fiber.   Brown whole-grain rice, buckwheat oats, and many fruits and vegetables are all good sources of fiber. These include: broccoli, Brussels sprouts, cabbage, cauliflower, beets, sweet potatoes, white potatoes (skin on), carrots, tomatoes, eggplant, squash, berries, fresh fruits, and dried fruits.   Cereals appear to be the richest source of fiber. Cereal fiber is found in whole grains and bran. Bran is the fiber-rich outer coat of cereal grain, which is largely removed in refining. In whole-grain cereals, the bran remains. In breakfast cereals, the largest amount of fiber is found in those with "bran" in their names. The fiber content is sometimes indicated on the  label.   You may need to include additional fruits and vegetables each day.   In baking, for 1 cup white flour, you may use the following substitutions:   1 cup whole-wheat flour minus 2 tablespoons.   1/2 cup white flour plus 1/2 cup whole-wheat flour.   Polyps, Colon  A polyp is extra tissue that grows inside your body. Colon polyps grow in the large intestine. The large intestine, also called the colon, is part of your digestive system. It is a long, hollow tube at the end of your digestive tract where your body makes and stores stool. Most polyps are not dangerous. They are benign. This means they are not cancerous. But over time, some types of polyps can turn into cancer. Polyps that are smaller than a pea are usually not harmful. But larger polyps could someday become or may already be cancerous. To be safe, doctors remove all polyps and test them.   PREVENTION There is not one sure way to prevent polyps. You might be able to lower your risk  of getting them if you:  Eat more fruits and vegetables and less fatty food.   Do not smoke.   Avoid alcohol.   Exercise every day.   Lose weight if you are overweight.   Eating more calcium and folate can also lower your risk of getting polyps. Some foods that are rich in calcium are milk, cheese, and broccoli. Some foods that are rich in folate are chickpeas, kidney beans, and spinach.    Diverticulosis Diverticulosis is a common condition that develops when small pouches (diverticula) form in the wall of the colon. The risk of diverticulosis increases with age. It happens more often in people who eat a low-fiber diet. Most individuals with diverticulosis have no symptoms. Those individuals with symptoms usually experience belly (abdominal) pain, constipation, or loose stools (diarrhea).  HOME CARE INSTRUCTIONS  Increase the amount of fiber in your diet as directed by your caregiver or dietician. This may reduce symptoms of  diverticulosis.   Drink at least 6 to 8 glasses of water each day to prevent constipation.   Try not to strain when you have a bowel movement.   Avoiding nuts and seeds to prevent complications is NOT NECESSARY.   FOODS HAVING HIGH FIBER CONTENT INCLUDE:  Fruits. Apple, peach, pear, tangerine, raisins, prunes.   Vegetables. Brussels sprouts, asparagus, broccoli, cabbage, carrot, cauliflower, romaine lettuce, spinach, summer squash, tomato, winter squash, zucchini.   Starchy Vegetables. Baked beans, kidney beans, lima beans, split peas, lentils, potatoes (with skin).   Grains. Whole wheat bread, brown rice, bran flake cereal, plain oatmeal, white rice, shredded wheat, bran muffins.   SEEK IMMEDIATE MEDICAL CARE IF:  You develop increasing pain or severe bloating.   You have an oral temperature above 101F.   You develop vomiting or bowel movements that are bloody or black.   Hemorrhoids Hemorrhoids are dilated (enlarged) veins around the rectum. Sometimes clots will form in the veins. This makes them swollen and painful. These are called thrombosed hemorrhoids. Causes of hemorrhoids include:  Constipation.   Straining to have a bowel movement.   HEAVY LIFTING   HOME CARE INSTRUCTIONS  Eat a well balanced diet and drink 6 to 8 glasses of water every day to avoid constipation. You may also use a bulk laxative.   Avoid straining to have bowel movements.   Keep anal area dry and clean.   Do not use a donut shaped pillow or sit on the toilet for long periods. This increases blood pooling and pain.   Move your bowels when your body has the urge; this will require less straining and will decrease pain and pressure.

## 2020-02-04 NOTE — Telephone Encounter (Signed)

## 2020-02-04 NOTE — Transfer of Care (Signed)
Immediate Anesthesia Transfer of Care Note  Patient: Beth [name]  Procedure(s) Performed: COLONOSCOPY WITH PROPOFOL (N/A ) POLYPECTOMY BIOPSY  Patient Location: PACU  Anesthesia Type:MAC  Level of Consciousness: awake, alert  and oriented  Airway & Oxygen Therapy: Patient Spontanous Breathing  Post-op Assessment: Report given to RN, Post -op Vital signs reviewed and stable and Patient moving all extremities X 4  Post vital signs: Reviewed and stable  Last Vitals:  Vitals Value Taken Time  BP 118/83 02/04/20 0945  Temp    Pulse 67 02/04/20 0946  Resp 19 02/04/20 0946  SpO2 90 % 02/04/20 0946  Vitals shown include unvalidated device data.  Last Pain:  Vitals:   02/04/20 0733  TempSrc: Oral  PainSc: 0-No pain         Complications: No apparent anesthesia complications

## 2020-02-04 NOTE — Anesthesia Preprocedure Evaluation (Signed)
Anesthesia Evaluation  Patient identified by MRN, date of birth, ID band Patient awake    Reviewed: Allergy & Precautions, H&P , NPO status , Patient's Chart, lab work & pertinent test results, reviewed documented beta blocker date and time   Airway        Dental   Pulmonary COPD, Current Smoker and Patient abstained from smoking.,           Cardiovascular negative cardio ROS       Neuro/Psych negative neurological ROS  negative psych ROS   GI/Hepatic negative GI ROS, Neg liver ROS,   Endo/Other  negative endocrine ROS  Renal/GU negative Renal ROS  negative genitourinary   Musculoskeletal   Abdominal   Peds  Hematology negative hematology ROS (+)   Anesthesia Other Findings   Reproductive/Obstetrics negative OB ROS                             Anesthesia Physical Anesthesia Plan  ASA: III  Anesthesia Plan: MAC   Post-op Pain Management:    Induction:   PONV Risk Score and Plan:   Airway Management Planned:   Additional Equipment:   Intra-op Plan:   Post-operative Plan:   Informed Consent: I have reviewed the patients History and Physical, chart, labs and discussed the procedure including the risks, benefits and alternatives for the proposed anesthesia with the patient or authorized representative who has indicated his/her understanding and acceptance.       Plan Discussed with: CRNA  Anesthesia Plan Comments:         Anesthesia Quick Evaluation

## 2020-02-04 NOTE — Op Note (Addendum)
[facility] Patient Name: Beth [name] Procedure Date: 02/04/2020 8:03 AM MRN: [id number] Date of Birth: 13-May-1961 Attending MD: Barney Drain MD, MD CSN: [id number] Age: 59 Admit Type: Outpatient Procedure:                Colonoscopy WITH EMR/COLD FORCEPS                            POLYPECTOMY/SUBMUCOSAL INJECTION/CLIPSx4 Indications:              Screening for colorectal malignant neoplasm Providers:                Barney Drain MD, MD, Janeece Riggers, RN Referring MD:             Ralene Bathe. Dondiego, MD Medicines:                Propofol per Anesthesia Complications:            No immediate complications. Estimated Blood Loss:     Estimated blood loss was minimal. Procedure:                Pre-Anesthesia Assessment:                           - Prior to the procedure, a History and Physical                            was performed, and patient medications and                            allergies were reviewed. The patient's tolerance of                            previous anesthesia was also reviewed. The risks                            and benefits of the procedure and the sedation                            options and risks were discussed with the patient.                            All questions were answered, and informed consent                            was obtained. Prior Anticoagulants: The patient has                            taken no previous anticoagulant or antiplatelet                            agents. ASA Grade Assessment: II - A patient with                            mild systemic disease. After reviewing the risks  and benefits, the patient was deemed in                            satisfactory condition to undergo the procedure.                            After obtaining informed consent, the colonoscope                            was passed under direct vision. Throughout the                            procedure, the patient's blood  pressure, pulse, and                            oxygen saturations were monitored continuously. The                            [id number]-[id number] ([id number]) scope was introduced through                            the anus and advanced to the the cecum, identified                            by appendiceal orifice and ileocecal valve. The                            colonoscopy was performed with difficulty due to                            significant looping and THE FLAT SERPIGINOUS POLYP.                            Successful completion of the procedure was aided by                            increasing the dose of sedation medication,                            changing the patient to a supine position,                            straightening and shortening the scope to obtain                            bowel loop reduction and COLOWRAP. The patient                            tolerated the procedure fairly well. The quality of                            the bowel preparation was excellent. The ileocecal  valve, appendiceal orifice, and rectum were                            photographed. Scope In: 8:33:36 AM Scope Out: 9:39:38 AM Scope Withdrawal Time: 0 hours 56 minutes 59 seconds  Total Procedure Duration: 1 hour 6 minutes 2 seconds  Findings:      A 6 to 20 mm polyp was found in the cecum. The polyp was carpet-like and       flat. Preparations were made for mucosal resection. 6 CC Orise gel was       injected to raise the lesion. Snare mucosal resection was performed BUT       UNSUCCESSFUL. POLYP COULD NOT BE RETAINED IN THE SNARE. Resection and       retrieval were complete VIA COLD FORCEPS. Area was successfully injected       with 3 mL of a 1:10,000 solution of epinephrine for drug       delivery/HEMOSTASIS. To prevent bleeding after the polypectomy, four       hemostatic clips were successfully placed (MR conditional). There was no       bleeding at the end of  the procedure. Polypectomy was attempted,       initially using a cold snare. Polyp resection was incomplete with this       device. This intervention then required a different device and       polypectomy technique. The polyp was removed with a cold biopsy forceps.       Resection and retrieval were complete. Coagulation for hemostasis of       bleeding AT [facility] AND CENTRAL PORTION OF POLYPECTOMY SITE caused by the       procedure using argon plasma at 0.5 liters/minute and 20 watts was       successful.      A few small-mouthed diverticula were found in the recto-sigmoid colon       and sigmoid colon.      External and internal hemorrhoids were found.      The recto-sigmoid colon, sigmoid colon, descending colon and splenic       flexure were grossly tortuous. Impression:               - One 6 to 20 mm polyp in the cecum, removed with                            mucosal resection(COLD SNARE) and removed with a                            cold biopsy forceps. Resected and retrieved.                            Injected. Clips (MR conditional) were placed.                            Treated with argon plasma coagulation (APC).                           - Diverticulosis in the recto-sigmoid colon and in  the sigmoid colon.                           - External and internal hemorrhoids.                           - Tortuous colon.                           - Mucosal resection was performed. Resection and                            retrieval were complete. Moderate Sedation:      Per Anesthesia Care Recommendation:           - Patient has a contact number available for                            emergencies. The signs and symptoms of potential                            delayed complications were discussed with the                            patient. Return to normal activities tomorrow.                            Written discharge instructions were provided to the                             patient.                           - NEEDS KUB TO ASSESS FOR RETAINED CLIPS PRIOR TO                            MRI.                           - High fiber diet.                           - Continue present medications.                           - Await pathology results.                           - Repeat colonoscopy date to be determined after                            pending pathology results are reviewed for                            surveillance. Procedure Code(s):        --- Professional ---  79390, Colonoscopy, flexible; with endoscopic                            mucosal resection Diagnosis Code(s):        --- Professional ---                           Z12.11, Encounter for screening for malignant                            neoplasm of colon                           K63.5, Polyp of colon                           K64.8, Other hemorrhoids                           K57.30, Diverticulosis of large intestine without                            perforation or abscess without bleeding                           Q43.8, Other specified congenital malformations of                            intestine CPT copyright 2019 [employer]. All rights reserved. The codes documented in this report are preliminary and upon coder review may  be revised to meet current compliance requirements. Barney Drain, MD Barney Drain MD, MD 02/04/2020 9:52:03 AM This report has been signed electronically. Number of Addenda: 0

## 2020-02-04 NOTE — Anesthesia Postprocedure Evaluation (Signed)
Anesthesia Post Note  Patient: Beth [name]  Procedure(s) Performed: COLONOSCOPY WITH PROPOFOL (N/A ) POLYPECTOMY BIOPSY  Patient location during evaluation: Endoscopy Anesthesia Type: MAC Level of consciousness: awake and alert Pain management: pain level controlled Vital Signs Assessment: post-procedure vital signs reviewed and stable Respiratory status: spontaneous breathing, nonlabored ventilation, respiratory function stable and patient connected to nasal cannula oxygen Cardiovascular status: blood pressure returned to baseline and stable Postop Assessment: no apparent nausea or vomiting Anesthetic complications: no     Last Vitals:  Vitals:   02/04/20 0733 02/04/20 0945  BP: 127/80   Pulse: 69   Resp: 18   Temp: 36.7 C (!) (P) 36.1 C  SpO2: 94%     Last Pain:  Vitals:   02/04/20 0733  TempSrc: Oral  PainSc: 0-No pain                 Talitha Givens

## 2020-02-04 NOTE — H&P (Signed)
Primary Care Physician:  Lucia Gaskins, MD Primary Gastroenterologist:  Dr. Oneida Alar  Pre-Procedure History & Physical: HPI:  Beth [name] is a 59 y.o. female here for [place].  Past Medical History:  Diagnosis Date  . Brain bleed ([place]) 2012  . Cervical cancer ([place])   . COPD (chronic obstructive pulmonary disease) ([place])   . Lung cancer ([place]) 2008   reports one small spot seen on her lungs in the past. Denies daignosis of cancer.   . Lung infection     Past Surgical History:  Procedure Laterality Date  . MULTIPLE TOOTH EXTRACTIONS    . rod in arm      Prior to Admission medications   Medication Sig Start Date End Date Taking? Authorizing Provider  albuterol (PROVENTIL HFA;VENTOLIN HFA) 108 (90 BASE) MCG/ACT inhaler Inhale 1-2 puffs into the lungs every 6 (six) hours as needed for wheezing or shortness of breath.   Yes [provider]  Cholecalciferol (VITAMIN D3) 125 MCG (5000 UT) TABS Take 5,000 Units by mouth daily.    Yes [provider]  Multiple Vitamin (MULTIVITAMIN WITH MINERALS) TABS tablet Take 1 tablet by mouth daily.   Yes [provider]  traMADol (ULTRAM) 50 MG tablet Take 50 mg by mouth in the morning, at noon, and at bedtime.  10/09/19  Yes [provider]    Allergies as of 10/30/2019 - Review Complete 10/30/2019  Allergen Reaction Noted  . Codeine Itching 08/14/2011    Family History  Problem Relation Age of Onset  . Throat cancer Mother   . Lung cancer Father   . Throat cancer Brother   . Lung cancer Brother   . Colon cancer Neg Hx     Social History   Socioeconomic History  . Marital status: Married    Spouse name: Not on file  . Number of children: Not on file  . Years of education: Not on file  . Highest education level: Not on file  Occupational History  . Not on file  Tobacco Use  . Smoking status: Current Every Day Smoker    Packs/day: 1.00    Years: 35.00    Pack years: 35.00   Types: Cigarettes  . Smokeless tobacco: Never Used  Substance and Sexual Activity  . Alcohol use: Not Currently    Comment: "not as much"  . Drug use: No  . Sexual activity: Yes    Birth control/protection: Post-menopausal  Other Topics Concern  . Not on file  Social History Narrative  . Not on file   Social Determinants of Health   Financial Resource Strain:   . Difficulty of Paying Living Expenses: Not on file  Food Insecurity:   . Worried About Charity fundraiser in the Last Year: Not on file  . Ran Out of Food in the Last Year: Not on file  Transportation Needs:   . Lack of Transportation (Medical): Not on file  . Lack of Transportation (Non-Medical): Not on file  Physical Activity:   . Days of Exercise per Week: Not on file  . Minutes of Exercise per Session: Not on file  Stress:   . Feeling of Stress : Not on file  Social Connections:   . Frequency of Communication with Friends and Family: Not on file  . Frequency of Social Gatherings with Friends and Family: Not on file  . Attends Religious Services: Not on file  . Active Member of Clubs or Organizations: Not on file  . Attends  Club or Organization Meetings: Not on file  . Marital Status: Not on file  Intimate Partner Violence:   . Fear of Current or Ex-Partner: Not on file  . Emotionally Abused: Not on file  . Physically Abused: Not on file  . Sexually Abused: Not on file    Review of Systems: See HPI, otherwise negative ROS   Physical Exam: BP 127/80   Pulse 69   Temp 98 F (36.7 C) (Oral)   Resp 18   Ht 5\' 2"  (1.575 m)   Wt 40.4 kg   LMP 10/08/2019 Comment: Still spotting since November 10  SpO2 94%   BMI 16.28 kg/m  General:   Alert,  pleasant and cooperative in NAD Head:  Normocephalic and atraumatic. Neck:  Supple; Lungs:  Clear throughout to auscultation.    Heart:  Regular rate and rhythm. Abdomen:  Soft, nontender and nondistended. Normal bowel sounds, without guarding, and without  rebound.   Neurologic:  Alert and  oriented x4;  grossly normal neurologically.  Impression/Plan:    SCREENING  Plan:  1. TCS TODAY DISCUSSED PROCEDURE, BENEFITS, & RISKS: < 1% chance of medication reaction, bleeding, perforation, ASPIRATION, or rupture of spleen/liver requiring surgery to fix it and missed polyps < 1 cm 10-20% of the time.

## 2020-02-05 ENCOUNTER — Telehealth: Payer: Self-pay | Admitting: Gastroenterology

## 2020-02-05 ENCOUNTER — Other Ambulatory Visit: Payer: Self-pay

## 2020-02-05 ENCOUNTER — Ambulatory Visit: Payer: Medicaid Other | Admitting: Obstetrics and Gynecology

## 2020-02-05 ENCOUNTER — Encounter: Payer: Self-pay | Admitting: Obstetrics and Gynecology

## 2020-02-05 VITALS — BP 141/83 | HR 61 | Ht 62.0 in | Wt 89.6 lb

## 2020-02-05 DIAGNOSIS — N939 Abnormal uterine and vaginal bleeding, unspecified: Secondary | ICD-10-CM | POA: Diagnosis not present

## 2020-02-05 DIAGNOSIS — Z7689 Persons encountering health services in other specified circumstances: Secondary | ICD-10-CM | POA: Diagnosis not present

## 2020-02-05 LAB — POCT HEMOGLOBIN: Hemoglobin: 13.3 g/dL (ref 11–14.6)

## 2020-02-05 LAB — SURGICAL PATHOLOGY

## 2020-02-05 NOTE — Telephone Encounter (Signed)
PLEASE CALL PT. SHE HAD ONE LARGE SIMPLE ADENOMA REMOVED. NEXT COLONOSCOPY IN 3 YEARS.  IF YOU NEED AN MRI, LET THE RADIOLOGY TECH KNOW THAT YOU HAD FOUR CLIPS PLACED IN YOUR COLON. THEY SHOULD FALL OFF IN 30 DAYS.

## 2020-02-05 NOTE — Progress Notes (Signed)
Patient ID: Beth [name], female   DOB: Sep 16, 1961, 59 y.o.   MRN: [id number]    [facility] Visit  @DATE @            Patient name: Beth [name] MRN [id number]  Date of birth: 08-09-1961  CC & HPI:  Beth [name] is a 59 y.o. female presenting today for follow-up of post-menopausal bleeding. She denies any vaginal bleeding/discharge at this time. Denies fever and chills.  Of note, the patient had a colonoscopy yesterday. Pathology from the procedure showed multiple fragments of tubular adenoma(s) without high-grade dysplasia or malignancy.  ROS:  ROS   - vaginal discharge/bleeding - fever - chills   Pertinent History Reviewed:   Reviewed: Significant for cervical cancer. Medical         Past Medical History:  Diagnosis Date  . Brain bleed ([place]) 2012  . Cervical cancer ([place])   . COPD (chronic obstructive pulmonary disease) ([place])   . Lung cancer ([place]) 2008   reports one small spot seen on her lungs in the past. Denies daignosis of cancer.   . Lung infection                               Surgical Hx:    Past Surgical History:  Procedure Laterality Date  . MULTIPLE TOOTH EXTRACTIONS    . rod in arm     Medications: Reviewed & Updated - see associated section                       Current Outpatient Medications:  .  albuterol (PROVENTIL HFA;VENTOLIN HFA) 108 (90 BASE) MCG/ACT inhaler, Inhale 1-2 puffs into the lungs every 6 (six) hours as needed for wheezing or shortness of breath., Disp: , Rfl:  .  Cholecalciferol (VITAMIN D3) 125 MCG (5000 UT) TABS, Take 5,000 Units by mouth daily. , Disp: , Rfl:  .  Multiple Vitamin (MULTIVITAMIN WITH MINERALS) TABS tablet, Take 1 tablet by mouth daily., Disp: , Rfl:  .  traMADol (ULTRAM) 50 MG tablet, Take 50 mg by mouth in the morning, at noon, and at bedtime. , Disp: , Rfl:    Social History: Reviewed -  reports that she has been smoking cigarettes. She has a 35.00 pack-year smoking history. She has never used smokeless  tobacco.  Objective Findings:  Vitals: Blood pressure (!) 141/83, pulse 61, height 5\' 2"  (1.575 m), weight 89 lb 9.6 oz (40.6 kg), last menstrual period 10/08/2019.  PHYSICAL EXAMINATION General appearance - alert, well appearing, and in no distress Mental status - alert, oriented to person, place, and time, normal mood, behavior, speech, dress, motor activity, and thought processes   Assessment & Plan:   A:  1. Pyometra  1. Post-menopausal vaginal bleeding/ pyometra  resolved. Pelvic ultrasound scheduled for two weeks from now.   By signing my name below, I, Clerance Lav, attest that this documentation has been prepared under the direction and in the presence of Jonnie Kind, MD. Electronically Signed: [place]. 02/05/20. 10:49 AM.  I personally performed the services described in this documentation, which was SCRIBED in my presence. The recorded information has been reviewed and considered accurate. It has been edited as necessary during review. Jonnie Kind, MD

## 2020-02-06 NOTE — Telephone Encounter (Signed)
Called pt and informed her of results.  Pt was informed that next colonoscopy would be needed in 3 years.  Pt was advised if she needs an MRI, to let Radiology tech know that she had 4 clips placed in her colon.  She was informed that they should fall off in 30 days.  Pt voiced understanding.

## 2020-02-11 ENCOUNTER — Other Ambulatory Visit: Payer: Self-pay | Admitting: Obstetrics and Gynecology

## 2020-02-11 ENCOUNTER — Telehealth: Payer: Self-pay | Admitting: Obstetrics & Gynecology

## 2020-02-11 DIAGNOSIS — N711 Chronic inflammatory disease of uterus: Secondary | ICD-10-CM

## 2020-02-11 NOTE — Telephone Encounter (Signed)

## 2020-02-12 ENCOUNTER — Other Ambulatory Visit: Payer: Self-pay

## 2020-02-12 ENCOUNTER — Ambulatory Visit (INDEPENDENT_AMBULATORY_CARE_PROVIDER_SITE_OTHER): Payer: Medicaid Other

## 2020-02-12 DIAGNOSIS — N719 Inflammatory disease of uterus, unspecified: Secondary | ICD-10-CM

## 2020-02-12 DIAGNOSIS — Z7689 Persons encountering health services in other specified circumstances: Secondary | ICD-10-CM | POA: Diagnosis not present

## 2020-02-12 DIAGNOSIS — N711 Chronic inflammatory disease of uterus: Secondary | ICD-10-CM

## 2020-02-12 NOTE — Progress Notes (Signed)
PELVIC US TA/TV:atropic homogeneous anteverted uterus,wnl,EEC 1.3 mm,trace of simple fluid with in the endometrium,normal ovaries,ovaries appear mobile,no free fluid,no pain during ultrasound

## 2020-02-20 ENCOUNTER — Telehealth: Payer: Self-pay | Admitting: *Deleted

## 2020-02-20 NOTE — Telephone Encounter (Signed)
Pt left message that she would like her ultrasound results.

## 2020-02-21 NOTE — Telephone Encounter (Signed)
The pyometra has resolved. I thought I had informed her of the normal results after the u/s was read on the 18th.. Please let me know if further questions exist.

## 2020-02-21 NOTE — Telephone Encounter (Signed)
Patient informed pyometra had resolved and u/s looked normal per Dr Glo Herring.  Pt verbalized understanding with no further questions.

## 2020-02-27 ENCOUNTER — Other Ambulatory Visit (HOSPITAL_COMMUNITY): Payer: Self-pay | Admitting: Family Medicine

## 2020-02-27 DIAGNOSIS — G8929 Other chronic pain: Secondary | ICD-10-CM | POA: Diagnosis not present

## 2020-02-27 DIAGNOSIS — R5382 Chronic fatigue, unspecified: Secondary | ICD-10-CM | POA: Diagnosis not present

## 2020-02-27 DIAGNOSIS — M25561 Pain in right knee: Secondary | ICD-10-CM

## 2020-02-27 DIAGNOSIS — Z7689 Persons encountering health services in other specified circumstances: Secondary | ICD-10-CM | POA: Diagnosis not present

## 2020-02-27 DIAGNOSIS — M2550 Pain in unspecified joint: Secondary | ICD-10-CM | POA: Diagnosis not present

## 2020-02-27 DIAGNOSIS — E559 Vitamin D deficiency, unspecified: Secondary | ICD-10-CM | POA: Diagnosis not present

## 2020-02-28 ENCOUNTER — Other Ambulatory Visit: Payer: Self-pay

## 2020-02-28 ENCOUNTER — Ambulatory Visit (HOSPITAL_COMMUNITY)
Admission: RE | Admit: 2020-02-28 | Discharge: 2020-02-28 | Disposition: A | Discharge status: Home / Self Care | Payer: Medicaid Other | Source: Ambulatory Visit | Attending: Family Medicine | Admitting: Family Medicine

## 2020-02-28 DIAGNOSIS — M25561 Pain in right knee: Secondary | ICD-10-CM

## 2020-02-28 DIAGNOSIS — S82101D Unspecified fracture of upper end of right tibia, subsequent encounter for closed fracture with routine healing: Secondary | ICD-10-CM | POA: Diagnosis not present

## 2020-03-26 ENCOUNTER — Telehealth: Payer: Self-pay | Admitting: Obstetrics and Gynecology

## 2020-03-26 DIAGNOSIS — Z7689 Persons encountering health services in other specified circumstances: Secondary | ICD-10-CM | POA: Diagnosis not present

## 2020-03-26 DIAGNOSIS — M25561 Pain in right knee: Secondary | ICD-10-CM | POA: Diagnosis not present

## 2020-03-26 DIAGNOSIS — R5382 Chronic fatigue, unspecified: Secondary | ICD-10-CM | POA: Diagnosis not present

## 2020-03-26 DIAGNOSIS — E559 Vitamin D deficiency, unspecified: Secondary | ICD-10-CM | POA: Diagnosis not present

## 2020-03-26 NOTE — Telephone Encounter (Signed)
Pt states that her discharge and she is wanting to see if Dr. Glo Herring will sign off on a form that should be sent to [place] for her to get free pads and depends with her medicaid. Pt states she is not ready to have a ablation yet and wants to go this route first.

## 2020-03-31 ENCOUNTER — Telehealth: Payer: Self-pay | Admitting: Obstetrics and Gynecology

## 2020-03-31 NOTE — Telephone Encounter (Signed)
Beth [name] reports that her discharge is back, though not severe. She is willing to make an appointment.  Will see next week.

## 2020-03-31 NOTE — Telephone Encounter (Signed)
I have called the patient and asked her to make an appointment for reevaluation, if this pyometra comes back , I would recommend a course of antibiotics and when health, proceed with hysterectomy with BSO

## 2020-04-02 ENCOUNTER — Telehealth: Payer: Self-pay | Admitting: Obstetrics and Gynecology

## 2020-04-02 NOTE — Progress Notes (Signed)
Patient ID: Beth [name], female   DOB: 1961-05-30, 59 y.o.   MRN: [id number]    [facility] Visit  @DATE @            Patient name: Beth [name] MRN [id number]  Date of birth: 09-22-1961  CC & HPI:  Beth [name] is a 59 y.o. female presenting today for re-evaluation of vaginal discharge that began last week. She was last seen in office on 02/05/2020, during which time she denied any vaginal bleeding or discharge. She underwent a transvaginal/pelvic ultrasound on 02/12/2020 that showed resolved pyometra.   See my note above Assessment & Plan:   A:  1.  Vaginal discharge  P:  1. If pyometra returns, I recommend a course of antibiotics followed by hysterectomy with BSO.  By signing my name below, I, Clerance Lav, attest that this documentation has been prepared under the direction and in the presence of Jonnie Kind, MD. Electronically Signed: [place]. 04/02/20. 9:49 PM.  I personally performed the services described in this documentation, which was SCRIBED in my presence. The recorded information has been reviewed and considered accurate. It has been edited as necessary during review. Jonnie Kind, MD

## 2020-04-02 NOTE — Telephone Encounter (Signed)

## 2020-04-02 NOTE — Progress Notes (Signed)
PATIENT ID: Beth [name], female     DOB: 03-Jul-1961, 59 y.o.     MRN: [id number]    [facility] Visit  04/02/20        Patient name: Beth [name] MRN [id number]  Date of birth: 20-Nov-1961  CC & HPI:  Beth [name] is a 59 y.o. female presenting today for post-menopausal bleeding and review of pelvic ultrasound.  She is noted more discharge than she would normally have anticipated but it is a clear secretion.  She specifically denies any bleeding or malodor or pelvic pain   ROS:  Review of Systems  Constitutional: Negative for diaphoresis, fever, malaise/fatigue and weight loss.  HENT: Negative for congestion and sore throat.   Eyes: Negative for blurred vision and double vision.  Respiratory: Negative for cough and shortness of breath.   Cardiovascular: Negative for chest pain, palpitations and leg swelling.  Gastrointestinal: Negative for constipation, diarrhea, nausea and vomiting.  Genitourinary: Negative for frequency and urgency.  Musculoskeletal: Negative for back pain, falls and myalgias.  Skin: Negative for rash.  Neurological: Negative for dizziness, weakness and headaches.  Psychiatric/Behavioral: Negative for depression. The patient is not nervous/anxious.    02/12/2020 PELVIC US TA/TV:atropic homogeneous anteverted uterus, wnl, EEC 1.3 mm, trace of simple fluid with in the endometrium, normal ovaries, ovaries appear mobile, no free fluid, no pain during ultrasound  Pertinent History Reviewed:   Reviewed: Significant for no pelvic pain or pelvic discomfort or pressure.  She did wear a pair panties recently and noticed that there was excess moisture in the perineal area felt due to vaginal discharge Medical         Past Medical History:  Diagnosis Date  . Brain bleed ([place]) 2012  . Cervical cancer ([place])   . COPD (chronic obstructive pulmonary disease) ([place])   . Lung cancer ([place]) 2008   reports one small spot seen on her lungs in the past. Denies daignosis of cancer.    . Lung infection                               Surgical Hx:    Past Surgical History:  Procedure Laterality Date  . BIOPSY  02/04/2020   Procedure: BIOPSY;  Surgeon: Danie Binder, MD;  [facility];  Service: Endoscopy;;  . COLONOSCOPY WITH PROPOFOL N/A 02/04/2020   Procedure: COLONOSCOPY WITH PROPOFOL;  Surgeon: Danie Binder, MD;  Location[facility];  Service: Endoscopy;  Laterality: N/A;  8:45am  . MULTIPLE TOOTH EXTRACTIONS    . POLYPECTOMY  02/04/2020   Procedure: POLYPECTOMY;  Surgeon: Danie Binder, MD;  [facility];  Service: Endoscopy;;  . rod in arm     Medications: Reviewed & Updated - see associated section                       Current Outpatient Medications:  .  albuterol (PROVENTIL HFA;VENTOLIN HFA) 108 (90 BASE) MCG/ACT inhaler, Inhale 1-2 puffs into the lungs every 6 (six) hours as needed for wheezing or shortness of breath., Disp: , Rfl:  .  Cholecalciferol (VITAMIN D3) 125 MCG (5000 UT) TABS, Take 5,000 Units by mouth daily. , Disp: , Rfl:  .  Multiple Vitamin (MULTIVITAMIN WITH MINERALS) TABS tablet, Take 1 tablet by mouth daily., Disp: , Rfl:  .  traMADol (ULTRAM) 50 MG tablet, Take 50 mg by mouth in the morning, at  noon, and at bedtime. , Disp: , Rfl:    Social History: Reviewed -  reports that she has been smoking cigarettes. She has a 35.00 pack-year smoking history. She has never used smokeless tobacco.  Objective Findings:  Vitals: Last menstrual period 10/08/2019.  PHYSICAL EXAMINATION General appearance - alert, well appearing, and in no distress, oriented to person, place, and time and normal appearing weight Mental status - alert, oriented to person, place, and time, normal mood, behavior, speech, dress, motor activity, and thought processes Chest - not examined Heart - normal rate and regular rhythm Abdomen - soft, nontender, nondistended, no masses or organomegaly Breasts -  Skin - normal coloration and turgor, no  rashes, no suspicious skin lesions noted  PELVIC External genitalia -postmenopausal small speculum used Vulva -normal atrophic postmenopausal Vagina -physiologic appearing discharge, slightly increased volume in speculum blade Cervix -tiny atrophic  Uterus -transabdominal ultrasound was done as a method of assessment.  There is a very thin fluid line less than 2 mm thick in the endometrial cavity  Adnexa -normal without excess fluid or tenderness [place] -  Rectal -not done    Assessment & Plan:   A:  1. No evidence of recurrence of the pyometra  P:  1. Follow-up repeat ultrasound in 6 months if the discharge remains a concern or earlier.  Bleeding or increasing discomfort    By signing my name below, I, [employer], attest that this documentation has been prepared under the direction and in the presence of Jonnie Kind, MD. Electronically Signed: [place]. 04/02/20. 11:14 PM.  I personally performed the services described in this documentation, which was SCRIBED in my presence. The recorded information has been reviewed and considered accurate. It has been edited as necessary during review. Jonnie Kind, MD

## 2020-04-03 ENCOUNTER — Encounter: Payer: Self-pay | Admitting: Obstetrics and Gynecology

## 2020-04-03 ENCOUNTER — Other Ambulatory Visit: Payer: Self-pay

## 2020-04-03 ENCOUNTER — Ambulatory Visit: Payer: Medicaid Other | Admitting: Obstetrics and Gynecology

## 2020-04-03 VITALS — BP 124/85 | HR 66 | Ht 62.0 in | Wt 93.0 lb

## 2020-04-03 DIAGNOSIS — N898 Other specified noninflammatory disorders of vagina: Secondary | ICD-10-CM

## 2020-04-03 DIAGNOSIS — Z7689 Persons encountering health services in other specified circumstances: Secondary | ICD-10-CM | POA: Diagnosis not present

## 2020-04-13 ENCOUNTER — Encounter: Payer: Self-pay | Admitting: Orthopedic Surgery

## 2020-04-20 ENCOUNTER — Telehealth: Payer: Self-pay | Admitting: *Deleted

## 2020-04-20 NOTE — Telephone Encounter (Signed)
Calling to follow up on incontinence supplies for this pt. Advised that you were working with a provider this week and may not get a return call immediately

## 2020-04-20 NOTE — Telephone Encounter (Signed)
Patient states she is in need of incontinent pads.  At times when she coughs, she feels as though she leaks urine.  Discussed with Dr Glo Herring who is willing to sign of underpads but does not feel she needs the gloves or chux.  Pt verbalized understanding and agreeable .

## 2020-04-22 DIAGNOSIS — R32 Unspecified urinary incontinence: Secondary | ICD-10-CM | POA: Diagnosis not present

## 2020-04-22 DIAGNOSIS — J449 Chronic obstructive pulmonary disease, unspecified: Secondary | ICD-10-CM | POA: Diagnosis not present

## 2020-04-23 DIAGNOSIS — R5382 Chronic fatigue, unspecified: Secondary | ICD-10-CM | POA: Diagnosis not present

## 2020-04-23 DIAGNOSIS — E789 Disorder of lipoprotein metabolism, unspecified: Secondary | ICD-10-CM | POA: Diagnosis not present

## 2020-04-23 DIAGNOSIS — M25561 Pain in right knee: Secondary | ICD-10-CM | POA: Diagnosis not present

## 2020-05-05 ENCOUNTER — Ambulatory Visit: Payer: Medicaid Other | Admitting: Orthopedic Surgery

## 2020-05-21 DIAGNOSIS — E7849 Other hyperlipidemia: Secondary | ICD-10-CM | POA: Diagnosis not present

## 2020-05-21 DIAGNOSIS — G8929 Other chronic pain: Secondary | ICD-10-CM | POA: Diagnosis not present

## 2020-05-21 DIAGNOSIS — E559 Vitamin D deficiency, unspecified: Secondary | ICD-10-CM | POA: Diagnosis not present

## 2020-05-22 ENCOUNTER — Telehealth: Payer: Self-pay | Admitting: Obstetrics and Gynecology

## 2020-05-22 ENCOUNTER — Ambulatory Visit: Payer: Medicaid Other | Admitting: Orthopedic Surgery

## 2020-05-22 NOTE — Telephone Encounter (Signed)
[employer] called about the patient in regards to her incontinence prescription.  They stated that something was stretched off two of the products that the patient wanted and that was what she really needed.  They will like for someone to call the patient and discuss about what was stretched off.  They also stated they are going to fax over some information about it.  The representative from [employer] stated in case someone wants to give them a call back a good contact for them is [phone].

## 2020-05-22 NOTE — Telephone Encounter (Signed)
Telephoned patient at home number and left message.  

## 2020-05-25 ENCOUNTER — Telehealth: Payer: Self-pay | Admitting: Obstetrics and Gynecology

## 2020-05-25 NOTE — Telephone Encounter (Signed)
Patient was returning your call back from 6/25.

## 2020-05-25 NOTE — Telephone Encounter (Signed)
Telephoned patient at home number and left message.  

## 2020-05-26 DIAGNOSIS — Z23 Encounter for immunization: Secondary | ICD-10-CM | POA: Diagnosis not present

## 2020-06-04 ENCOUNTER — Ambulatory Visit: Payer: Medicaid Other | Admitting: Orthopedic Surgery

## 2020-06-18 DIAGNOSIS — G8929 Other chronic pain: Secondary | ICD-10-CM | POA: Diagnosis not present

## 2020-06-18 DIAGNOSIS — E559 Vitamin D deficiency, unspecified: Secondary | ICD-10-CM | POA: Diagnosis not present

## 2020-06-18 DIAGNOSIS — M545 Low back pain: Secondary | ICD-10-CM | POA: Diagnosis not present

## 2020-06-23 DIAGNOSIS — I110 Hypertensive heart disease with heart failure: Secondary | ICD-10-CM | POA: Diagnosis not present

## 2020-06-23 DIAGNOSIS — D510 Vitamin B12 deficiency anemia due to intrinsic factor deficiency: Secondary | ICD-10-CM | POA: Diagnosis not present

## 2020-06-23 DIAGNOSIS — E7849 Other hyperlipidemia: Secondary | ICD-10-CM | POA: Diagnosis not present

## 2020-06-23 DIAGNOSIS — R5383 Other fatigue: Secondary | ICD-10-CM | POA: Diagnosis not present

## 2020-06-23 DIAGNOSIS — E559 Vitamin D deficiency, unspecified: Secondary | ICD-10-CM | POA: Diagnosis not present

## 2020-06-30 ENCOUNTER — Ambulatory Visit: Payer: Medicaid Other

## 2020-06-30 ENCOUNTER — Other Ambulatory Visit: Payer: Self-pay

## 2020-06-30 ENCOUNTER — Encounter: Payer: Self-pay | Admitting: Orthopedic Surgery

## 2020-06-30 ENCOUNTER — Ambulatory Visit: Payer: Medicaid Other | Admitting: Orthopedic Surgery

## 2020-06-30 VITALS — BP 135/95 | HR 73 | Ht 62.0 in | Wt 81.0 lb

## 2020-06-30 DIAGNOSIS — S82141S Displaced bicondylar fracture of right tibia, sequela: Secondary | ICD-10-CM | POA: Diagnosis not present

## 2020-06-30 DIAGNOSIS — G8929 Other chronic pain: Secondary | ICD-10-CM

## 2020-06-30 DIAGNOSIS — M25561 Pain in right knee: Secondary | ICD-10-CM

## 2020-06-30 DIAGNOSIS — M23321 Other meniscus derangements, posterior horn of medial meniscus, right knee: Secondary | ICD-10-CM

## 2020-06-30 NOTE — Patient Instructions (Signed)
Steps to Quit Smoking Smoking tobacco is the leading cause of preventable death. It can affect almost every organ in the body. Smoking puts you and people around you at risk for many serious, long-lasting (chronic) diseases. Quitting smoking can be hard, but it is one of the best things that you can do for your health. It is never too late to quit. How do I get ready to quit? When you decide to quit smoking, make a plan to help you succeed. Before you quit:  Pick a date to quit. Set a date within the next 2 weeks to give you time to prepare.  Write down the reasons why you are quitting. Keep this list in places where you will see it often.  Tell your family, friends, and co-workers that you are quitting. Their support is important.  Talk with your doctor about the choices that may help you quit.  Find out if your health insurance will pay for these treatments.  Know the people, places, things, and activities that make you want to smoke (triggers). Avoid them. What first steps can I take to quit smoking?  Throw away all cigarettes at home, at work, and in your car.  Throw away the things that you use when you smoke, such as ashtrays and lighters.  Clean your car. Make sure to empty the ashtray.  Clean your home, including curtains and carpets. What can I do to help me quit smoking? Talk with your doctor about taking medicines and seeing a counselor at the same time. You are more likely to succeed when you do both.  If you are pregnant or breastfeeding, talk with your doctor about counseling or other ways to quit smoking. Do not take medicine to help you quit smoking unless your doctor tells you to do so. To quit smoking: Quit right away  Quit smoking totally, instead of slowly cutting back on how much you smoke over a period of time.  Go to counseling. You are more likely to quit if you go to counseling sessions regularly. Take medicine You may take medicines to help you quit. Some  medicines need a prescription, and some you can buy over-the-counter. Some medicines may contain a drug called nicotine to replace the nicotine in cigarettes. Medicines may:  Help you to stop having the desire to smoke (cravings).  Help to stop the problems that come when you stop smoking (withdrawal symptoms). Your doctor may ask you to use:  Nicotine patches, gum, or lozenges.  Nicotine inhalers or sprays.  Non-nicotine medicine that is taken by mouth. Find resources Find resources and other ways to help you quit smoking and remain smoke-free after you quit. These resources are most helpful when you use them often. They include:  Online chats with a counselor.  Phone quitlines.  Printed self-help materials.  Support groups or group counseling.  Text messaging programs.  Mobile phone apps. Use apps on your mobile phone or tablet that can help you stick to your quit plan. There are many free apps for mobile phones and tablets as well as websites. Examples include Quit Guide from the CDC and smokefree.gov  What things can I do to make it easier to quit?   Talk to your family and friends. Ask them to support and encourage you.  Call a phone quitline (1-800-QUIT-NOW), reach out to support groups, or work with a counselor.  Ask people who smoke to not smoke around you.  Avoid places that make you want to smoke,   such as: ? Bars. ? Parties. ? Smoke-break areas at work.  Spend time with people who do not smoke.  Lower the stress in your life. Stress can make you want to smoke. Try these things to help your stress: ? Getting regular exercise. ? Doing deep-breathing exercises. ? Doing yoga. ? Meditating. ? Doing a body scan. To do this, close your eyes, focus on one area of your body at a time from head to toe. Notice which parts of your body are tense. Try to relax the muscles in those areas. How will I feel when I quit smoking? Day 1 to 3 weeks Within the first 24 hours,  you may start to have some problems that come from quitting tobacco. These problems are very bad 2-3 days after you quit, but they do not often last for more than 2-3 weeks. You may get these symptoms:  Mood swings.  Feeling restless, nervous, angry, or annoyed.  Trouble concentrating.  Dizziness.  Strong desire for high-sugar foods and nicotine.  Weight gain.  Trouble pooping (constipation).  Feeling like you may vomit (nausea).  Coughing or a sore throat.  Changes in how the medicines that you take for other issues work in your body.  Depression.  Trouble sleeping (insomnia). Week 3 and afterward After the first 2-3 weeks of quitting, you may start to notice more positive results, such as:  Better sense of smell and taste.  Less coughing and sore throat.  Slower heart rate.  Lower blood pressure.  Clearer skin.  Better breathing.  Fewer sick days. Quitting smoking can be hard. Do not give up if you fail the first time. Some people need to try a few times before they succeed. Do your best to stick to your quit plan, and talk with your doctor if you have any questions or concerns. Summary  Smoking tobacco is the leading cause of preventable death. Quitting smoking can be hard, but it is one of the best things that you can do for your health.  When you decide to quit smoking, make a plan to help you succeed.  Quit smoking right away, not slowly over a period of time.  When you start quitting, seek help from your doctor, family, or friends. This information is not intended to replace advice given to you by your health care provider. Make sure you discuss any questions you have with your health care provider. Document Revised: 08/09/2019 Document Reviewed: 02/02/2019 Elsevier Patient Education  2020 [employer]vier Inc.  

## 2020-06-30 NOTE — Addendum Note (Signed)
Addended by: Derek Mound A on: 06/30/2020 03:55 PM   Modules accepted: Orders
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
## 2020-07-01 DIAGNOSIS — H5213 Myopia, bilateral: Secondary | ICD-10-CM | POA: Diagnosis not present

## 2020-07-17 DIAGNOSIS — I110 Hypertensive heart disease with heart failure: Secondary | ICD-10-CM | POA: Diagnosis not present

## 2020-07-17 DIAGNOSIS — G8929 Other chronic pain: Secondary | ICD-10-CM | POA: Diagnosis not present

## 2020-07-17 DIAGNOSIS — M25561 Pain in right knee: Secondary | ICD-10-CM | POA: Diagnosis not present

## 2020-07-17 DIAGNOSIS — M542 Cervicalgia: Secondary | ICD-10-CM | POA: Diagnosis not present

## 2020-07-21 DIAGNOSIS — H524 Presbyopia: Secondary | ICD-10-CM | POA: Diagnosis not present

## 2020-07-21 DIAGNOSIS — H52223 Regular astigmatism, bilateral: Secondary | ICD-10-CM | POA: Diagnosis not present

## 2020-07-22 ENCOUNTER — Emergency Department (HOSPITAL_COMMUNITY): Payer: Medicaid Other

## 2020-07-22 ENCOUNTER — Other Ambulatory Visit: Payer: Self-pay

## 2020-07-22 ENCOUNTER — Encounter (HOSPITAL_COMMUNITY): Payer: Self-pay | Admitting: *Deleted

## 2020-07-22 ENCOUNTER — Emergency Department (HOSPITAL_COMMUNITY)
Admission: EM | Admit: 2020-07-22 | Discharge: 2020-07-23 | Disposition: A | Discharge status: Home / Self Care | Payer: Medicaid Other | Attending: Emergency Medicine | Admitting: Emergency Medicine

## 2020-07-22 DIAGNOSIS — R112 Nausea with vomiting, unspecified: Secondary | ICD-10-CM | POA: Diagnosis not present

## 2020-07-22 DIAGNOSIS — R0602 Shortness of breath: Secondary | ICD-10-CM | POA: Insufficient documentation

## 2020-07-22 DIAGNOSIS — Z20822 Contact with and (suspected) exposure to covid-19: Secondary | ICD-10-CM | POA: Diagnosis not present

## 2020-07-22 DIAGNOSIS — Z8541 Personal history of malignant neoplasm of cervix uteri: Secondary | ICD-10-CM | POA: Insufficient documentation

## 2020-07-22 DIAGNOSIS — F17210 Nicotine dependence, cigarettes, uncomplicated: Secondary | ICD-10-CM | POA: Insufficient documentation

## 2020-07-22 DIAGNOSIS — J449 Chronic obstructive pulmonary disease, unspecified: Secondary | ICD-10-CM | POA: Insufficient documentation

## 2020-07-22 DIAGNOSIS — R05 Cough: Secondary | ICD-10-CM | POA: Insufficient documentation

## 2020-07-22 DIAGNOSIS — R0789 Other chest pain: Secondary | ICD-10-CM | POA: Diagnosis not present

## 2020-07-22 DIAGNOSIS — E876 Hypokalemia: Secondary | ICD-10-CM | POA: Diagnosis not present

## 2020-07-22 DIAGNOSIS — M62838 Other muscle spasm: Secondary | ICD-10-CM | POA: Insufficient documentation

## 2020-07-22 DIAGNOSIS — Z79899 Other long term (current) drug therapy: Secondary | ICD-10-CM | POA: Diagnosis not present

## 2020-07-22 DIAGNOSIS — R059 Cough, unspecified: Secondary | ICD-10-CM

## 2020-07-22 LAB — CBC WITH DIFFERENTIAL/PLATELET
Abs Immature Granulocytes: 0.03 10*3/uL (ref 0.00–0.07)
Basophils Absolute: 0.1 10*3/uL (ref 0.0–0.1)
Basophils Relative: 1 %
Eosinophils Absolute: 0.2 10*3/uL (ref 0.0–0.5)
Eosinophils Relative: 2 %
HCT: 41.6 % (ref 36.0–46.0)
Hemoglobin: 13.4 g/dL (ref 12.0–15.0)
Immature Granulocytes: 0 %
Lymphocytes Relative: 35 %
Lymphs Abs: 2.9 10*3/uL (ref 0.7–4.0)
MCH: 31.1 pg (ref 26.0–34.0)
MCHC: 32.2 g/dL (ref 30.0–36.0)
MCV: 96.5 fL (ref 80.0–100.0)
Monocytes Absolute: 1 10*3/uL (ref 0.1–1.0)
Monocytes Relative: 11 %
Neutro Abs: 4.3 10*3/uL (ref 1.7–7.7)
Neutrophils Relative %: 51 %
Platelets: 415 10*3/uL — ABNORMAL HIGH (ref 150–400)
RBC: 4.31 MIL/uL (ref 3.87–5.11)
RDW: 13.2 % (ref 11.5–15.5)
WBC: 8.4 10*3/uL (ref 4.0–10.5)
nRBC: 0 % (ref 0.0–0.2)

## 2020-07-22 LAB — TROPONIN I (HIGH SENSITIVITY)
Troponin I (High Sensitivity): 2 ng/L (ref <18)
Troponin I (High Sensitivity): 2 ng/L (ref <18)

## 2020-07-22 LAB — BASIC METABOLIC PANEL
Anion gap: 13 (ref 5–15)
BUN: 5 mg/dL — ABNORMAL LOW (ref 6–20)
CO2: 24 mmol/L (ref 22–32)
Calcium: 9.2 mg/dL (ref 8.9–10.3)
Chloride: 100 mmol/L (ref 98–111)
Creatinine, Ser: 0.47 mg/dL (ref 0.44–1.00)
GFR calc Af Amer: >60 mL/min (ref >60)
GFR calc non Af Amer: >60 mL/min (ref >60)
Glucose, Bld: 96 mg/dL (ref 70–99)
Potassium: 3.2 mmol/L — ABNORMAL LOW (ref 3.5–5.1)
Sodium: 137 mmol/L (ref 135–145)

## 2020-07-22 NOTE — ED Triage Notes (Signed)
Pt c/o upper back pain with sob, cough that is productive with yellow sputum that started this am,

## 2020-07-23 LAB — SARS CORONAVIRUS 2 BY RT PCR (HOSPITAL ORDER, PERFORMED IN ~~LOC~~ HOSPITAL LAB): SARS Coronavirus 2: NEGATIVE

## 2020-07-23 MED ORDER — METHOCARBAMOL 500 MG PO TABS
500.0000 mg | ORAL_TABLET | Freq: Once | ORAL | Status: AC
  Administered 2020-07-23: 500 mg via ORAL
  Filled 2020-07-23: qty 1

## 2020-07-23 MED ORDER — METHOCARBAMOL 500 MG PO TABS: 500.0000 mg | ORAL_TABLET | Freq: Four times a day (QID) | ORAL | 0 refills | Status: DC | PRN

## 2020-07-23 MED ORDER — DOXYCYCLINE HYCLATE 100 MG PO CAPS: 100.0000 mg | ORAL_CAPSULE | Freq: Two times a day (BID) | ORAL | 0 refills | Status: DC

## 2020-07-23 MED ORDER — POTASSIUM CHLORIDE CRYS ER 20 MEQ PO TBCR: 20.0000 meq | EXTENDED_RELEASE_TABLET | Freq: Two times a day (BID) | ORAL | 0 refills | Status: DC

## 2020-07-23 MED ORDER — POTASSIUM CHLORIDE CRYS ER 20 MEQ PO TBCR
40.0000 meq | EXTENDED_RELEASE_TABLET | Freq: Once | ORAL | Status: AC
  Administered 2020-07-23: 40 meq via ORAL
  Filled 2020-07-23: qty 2

## 2020-07-23 NOTE — Discharge Instructions (Addendum)
Use ice and heat for comfort.  Take the medication as prescribed.  Use your inhaler as needed for shortness of breath or wheezing.  You do need to continue trying to quit smoking.  Recheck if you get a fever, struggle to breathe, or you feel worse.
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
## 2020-07-24 ENCOUNTER — Telehealth: Payer: Self-pay | Admitting: *Deleted

## 2020-07-24 NOTE — Telephone Encounter (Signed)
Attempted to contact patients pcp ,office closed until [facility] patient no answer .                                   Kanae Ignatowski                                    PEC                                   [phone]

## 2020-07-27 ENCOUNTER — Telehealth: Payer: Self-pay | Admitting: *Deleted

## 2020-07-27 ENCOUNTER — Ambulatory Visit (HOSPITAL_COMMUNITY)
Admission: RE | Admit: 2020-07-27 | Discharge: 2020-07-27 | Disposition: A | Discharge status: Home / Self Care | Payer: Medicaid Other | Source: Ambulatory Visit | Attending: Orthopedic Surgery | Admitting: Orthopedic Surgery

## 2020-07-27 ENCOUNTER — Other Ambulatory Visit: Payer: Self-pay

## 2020-07-27 DIAGNOSIS — M25561 Pain in right knee: Secondary | ICD-10-CM | POA: Diagnosis not present

## 2020-07-27 DIAGNOSIS — M23321 Other meniscus derangements, posterior horn of medial meniscus, right knee: Secondary | ICD-10-CM | POA: Insufficient documentation

## 2020-07-27 DIAGNOSIS — M1711 Unilateral primary osteoarthritis, right knee: Secondary | ICD-10-CM | POA: Diagnosis not present

## 2020-07-27 DIAGNOSIS — G8929 Other chronic pain: Secondary | ICD-10-CM | POA: Diagnosis not present

## 2020-07-27 DIAGNOSIS — M7121 Synovial cyst of popliteal space [Baker], right knee: Secondary | ICD-10-CM | POA: Diagnosis not present

## 2020-07-27 DIAGNOSIS — S83241A Other tear of medial meniscus, current injury, right knee, initial encounter: Secondary | ICD-10-CM | POA: Diagnosis not present

## 2020-07-27 DIAGNOSIS — M25461 Effusion, right knee: Secondary | ICD-10-CM | POA: Diagnosis not present

## 2020-07-27 NOTE — Telephone Encounter (Signed)
Contacted patient to complete transition of care assessment:  Transition Care Management Follow-up Telephone Call  . Medicaid Managed Care Transition Call Status:MM [facility] Call Made  . Date of discharge and from where: [facility], 07/23/20  . How have you been since you were released from the hospital? "ok"  . Any questions or concerns? No  Items Reviewed: Marland Kitchen Did the pt receive and understand the discharge instructions provided? Yes  . Medications obtained and verified? Yes  . Any new allergies since your discharge? No  . Dietary orders reviewed? n/a . Do you have support at home? Yes, family  Functional Questionnaire: (I = Independent and D = Dependent)  ADLs: Independent Bathing/Dressing:Independent Meal Prep: Independent Eating: Independent Maintaining continence: Independent Transferring/Ambulation: Independent Managing Meds: Independent   Follow up appointments reviewed:   PCP Hospital f/u appt confirmed? No  Patient to contact the office of Dr Cindie Laroche on 07/27/20 to schedule follow up appt  Specialist Hospital f/u appt confirmed? N/A  Are transportation arrangements needed? No   If their condition worsens, is the pt aware to call PCP or go to the EmergencyDept.? Yes   Was the patient provided with contact information for the PCP's office or ED? Yes   Was to pt encouraged to call back with questions or concerns? Yes  Lenor Coffin, RN, BSN, [place] Patient [place] [phone]

## 2020-07-30 ENCOUNTER — Ambulatory Visit: Payer: Medicaid Other | Admitting: Orthopedic Surgery

## 2020-08-06 ENCOUNTER — Other Ambulatory Visit: Payer: Self-pay

## 2020-08-06 ENCOUNTER — Ambulatory Visit (INDEPENDENT_AMBULATORY_CARE_PROVIDER_SITE_OTHER): Payer: Medicaid Other | Admitting: Orthopedic Surgery

## 2020-08-06 ENCOUNTER — Encounter: Payer: Self-pay | Admitting: Orthopedic Surgery

## 2020-08-06 VITALS — BP 147/104 | HR 80 | Ht 62.0 in | Wt 83.0 lb

## 2020-08-06 DIAGNOSIS — S82141S Displaced bicondylar fracture of right tibia, sequela: Secondary | ICD-10-CM | POA: Diagnosis not present

## 2020-08-06 DIAGNOSIS — G8929 Other chronic pain: Secondary | ICD-10-CM

## 2020-08-06 DIAGNOSIS — M25561 Pain in right knee: Secondary | ICD-10-CM | POA: Diagnosis not present

## 2020-08-06 DIAGNOSIS — M23321 Other meniscus derangements, posterior horn of medial meniscus, right knee: Secondary | ICD-10-CM

## 2020-08-06 NOTE — Addendum Note (Signed)
Addended byCandice Camp on: 08/06/2020 10:53 AM   Modules accepted: Orders, SmartSet
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
## 2020-08-06 NOTE — Patient Instructions (Signed)
Schedule postop appointment 1 week after surgery

## 2020-08-12 ENCOUNTER — Telehealth: Payer: Self-pay | Admitting: Radiology

## 2020-08-12 NOTE — Telephone Encounter (Signed)
Tracking ID [id number] Ref number [id number] Submitted for clinical review with [employer]

## 2020-08-14 DIAGNOSIS — I110 Hypertensive heart disease with heart failure: Secondary | ICD-10-CM | POA: Diagnosis not present

## 2020-08-14 DIAGNOSIS — R5382 Chronic fatigue, unspecified: Secondary | ICD-10-CM | POA: Diagnosis not present

## 2020-08-14 DIAGNOSIS — K219 Gastro-esophageal reflux disease without esophagitis: Secondary | ICD-10-CM | POA: Diagnosis not present

## 2020-08-14 DIAGNOSIS — E7849 Other hyperlipidemia: Secondary | ICD-10-CM | POA: Diagnosis not present

## 2020-08-17 NOTE — Patient Instructions (Signed)
Beth [name]  08/17/2020     @PREFPERIOPPHARMACY @   Your procedure is scheduled on  08/25/2020.  Report to [facility] at  [phone]  A.M.  Call this number if you have problems the morning of surgery:  [phone]   Remember:  Do not eat or drink after midnight.                         Take these medicines the morning of surgery with A SIP OF WATER  Oxycodone(if needed).    Do not wear jewelry, make-up or nail polish.  Do not wear lotions, powders, or perfumes. Please wear deodorant and brush your teeth.  Do not shave 48 hours prior to surgery.  Men may shave face and neck.  Do not bring valuables to the hospital.  [facility] is not responsible for any belongings or valuables.  Contacts, dentures or bridgework may not be worn into surgery.  Leave your suitcase in the car.  After surgery it may be brought to your room.  For patients admitted to the hospital, discharge time will be determined by your treatment team.  Patients discharged the day of surgery will not be allowed to drive home.   Name and phone number of your driver:   family Special instructions:  DO NOT smoke the morning of your procedure.  Please read over the following fact sheets that you were given. Anesthesia Post-op Instructions and Care and Recovery After Surgery       Arthroscopic Knee Ligament Repair, Care After This sheet gives you information about how to care for yourself after your procedure. Your health care provider may also give you more specific instructions. If you have problems or questions, contact your health care provider. What can I expect after the procedure? After the procedure, it is common to have:  Pain in your knee.  Bruising and swelling on your knee, calf, and ankle for 3-4 days.  Fatigue. Follow these instructions at home: If you have a brace or immobilizer:  Wear the brace or immobilizer as told by your health care provider. Remove it only as told by your health  care provider.  Loosen the splint or immobilizer if your toes tingle, become numb, or turn cold and blue.  Keep the brace or immobilizer clean. Bathing  Do not take baths, swim, or use a hot tub until your health care provider approves. Ask your health care provider if you can take showers.  Keep your bandage (dressing) dry until your health care provider says that it can be removed. Cover it and your brace or immobilizer with a watertight covering when you take a shower. Incision care   Follow instructions from your health care provider about how to take care of your incision. Make sure you: ? Wash your hands with soap and water before you change your bandage (dressing). If soap and water are not available, use hand sanitizer. ? Change your dressing as told by your health care provider. ? Leave stitches (sutures), skin glue, or adhesive strips in place. These skin closures may need to stay in place for 2 weeks or longer. If adhesive strip edges start to loosen and curl up, you may trim the loose edges. Do not remove adhesive strips completely unless your health care provider tells you to do that.  Check your incision area every day for signs of infection. Check for: ? More redness, swelling, or pain. ?  More fluid or blood. ? Warmth. ? Pus or a bad smell. Managing pain, stiffness, and swelling   If directed, put ice on the affected area. ? If you have a removable brace or immobilizer, remove it as told by your health care provider. ? Put ice in a plastic bag. ? Place a towel between your skin and the bag or between your brace or immobilizer and the bag. ? Leave the ice on for 20 minutes, 2-3 times a day.  Move your toes often to avoid stiffness and to lessen swelling.  Raise (elevate) the injured area above the level of your heart while you are sitting or lying down. Driving  Do not drive until your health care provider approves. If you have a brace or immobilizer on your leg,  ask your health care provider when it is safe for you to drive.  Do not drive or use heavy machinery while taking prescription pain medicine. Activity  Rest as directed. Ask your health care provider what activities are safe for you.  Do physical therapy exercises as told by your health care provider. Physical therapy will help you regain strength and motion in your knee.  Follow instructions from your health care provider about: ? When you may start motion exercises. ? When you may start riding a stationary bike and doing other low-impact activities. ? When you may start to jog and do other high-impact activities. Safety  Do not use the injured limb to support your body weight until your health care provider says that you can. Use crutches as told by your health care provider. General instructions  Do not use any products that contain nicotine or tobacco, such as cigarettes and e-cigarettes. These can delay bone healing. If you need help quitting, ask your health care provider.  To prevent or treat constipation while you are taking prescription pain medicine, your health care provider may recommend that you: ? Drink enough fluid to keep your urine clear or pale yellow. ? Take over-the-counter or prescription medicines. ? Eat foods that are high in fiber, such as fresh fruits and vegetables, whole grains, and beans. ? Limit foods that are high in fat and processed sugars, such as fried and sweet foods.  Take over-the-counter and prescription medicines only as told by your health care provider.  Keep all follow-up visits as told by your health care provider. This is important. Contact a health care provider if:  You have more redness, swelling, or pain around an incision.  You have more fluid or blood coming from an incision.  Your incision feels warm to the touch.  You have a fever.  You have pain or swelling in your knee, and it gets worse.  You have pain that does not get  better with medicine. Get help right away if:  You have trouble breathing.  You have pus or a bad smell coming from an incision.  You have numbness and tingling near the knee joint. Summary  After the procedure, it is common to have knee pain with bruising and swelling on your knee, calf, and ankle.  Icing your knee and raising your leg above the level of your heart will help control the pain and the swelling.  Do physical therapy exercises as told by your health care provider. Physical therapy will help you regain strength and motion in your knee. This information is not intended to replace advice given to you by your health care provider. Make sure you discuss any questions you  have with your health care provider. Document Revised: 10/27/2017 Document Reviewed: 11/08/2016 Elsevier Patient Education  [place].  How to Use Chlorhexidine for Bathing Chlorhexidine gluconate (CHG) is a germ-killing (antiseptic) solution that is used to clean the skin. It can get rid of the bacteria that normally live on the skin and can keep them away for about 24 hours. To clean your skin with CHG, you may be given:  A CHG solution to use in the shower or as part of a sponge bath.  A prepackaged cloth that contains CHG. Cleaning your skin with CHG may help lower the risk for infection:  While you are staying in the intensive care unit of the hospital.  If you have a vascular access, such as a central line, to provide short-term or long-term access to your veins.  If you have a catheter to drain urine from your bladder.  If you are on a ventilator. A ventilator is a machine that helps you breathe by moving air in and out of your lungs.  After surgery. What are the risks? Risks of using CHG include:  A skin reaction.  Hearing loss, if CHG gets in your ears.  Eye injury, if CHG gets in your eyes and is not rinsed out.  The CHG product catching fire. Make sure that you avoid smoking  and flames after applying CHG to your skin. Do not use CHG:  If you have a chlorhexidine allergy or have previously reacted to chlorhexidine.  On babies younger than 72 months of age. How to use CHG solution  Use CHG only as told by your health care provider, and follow the instructions on the label.  Use the full amount of CHG as directed. Usually, this is one bottle. During a shower Follow these steps when using CHG solution during a shower (unless your health care provider gives you different instructions): 1. Start the shower. 2. Use your normal soap and shampoo to wash your face and hair. 3. Turn off the shower or move out of the shower stream. 4. Pour the CHG onto a clean washcloth. Do not use any type of brush or rough-edged sponge. 5. Starting at your neck, lather your body down to your toes. Make sure you follow these instructions: ? If you will be having surgery, pay special attention to the part of your body where you will be having surgery. Scrub this area for at least 1 minute. ? Do not use CHG on your head or face. If the solution gets into your ears or eyes, rinse them well with water. ? Avoid your genital area. ? Avoid any areas of skin that have broken skin, cuts, or scrapes. ? Scrub your back and under your arms. Make sure to wash skin folds. 6. Let the lather sit on your skin for 1-2 minutes or as long as told by your health care provider. 7. Thoroughly rinse your entire body in the shower. Make sure that all body creases and crevices are rinsed well. 8. Dry off with a clean towel. Do not put any substances on your body afterward--such as powder, lotion, or perfume--unless you are told to do so by your health care provider. Only use lotions that are recommended by the manufacturer. 9. Put on clean clothes or pajamas. 10. If it is the night before your surgery, sleep in clean sheets.  During a sponge bath Follow these steps when using CHG solution during a sponge bath  (unless your health care provider gives  you different instructions): 1. Use your normal soap and shampoo to wash your face and hair. 2. Pour the CHG onto a clean washcloth. 3. Starting at your neck, lather your body down to your toes. Make sure you follow these instructions: ? If you will be having surgery, pay special attention to the part of your body where you will be having surgery. Scrub this area for at least 1 minute. ? Do not use CHG on your head or face. If the solution gets into your ears or eyes, rinse them well with water. ? Avoid your genital area. ? Avoid any areas of skin that have broken skin, cuts, or scrapes. ? Scrub your back and under your arms. Make sure to wash skin folds. 4. Let the lather sit on your skin for 1-2 minutes or as long as told by your health care provider. 5. Using a different clean, wet washcloth, thoroughly rinse your entire body. Make sure that all body creases and crevices are rinsed well. 6. Dry off with a clean towel. Do not put any substances on your body afterward--such as powder, lotion, or perfume--unless you are told to do so by your health care provider. Only use lotions that are recommended by the manufacturer. 7. Put on clean clothes or pajamas. 8. If it is the night before your surgery, sleep in clean sheets. How to use CHG prepackaged cloths  Only use CHG cloths as told by your health care provider, and follow the instructions on the label.  Use the CHG cloth on clean, dry skin.  Do not use the CHG cloth on your head or face unless your health care provider tells you to.  When washing with the CHG cloth: ? Avoid your genital area. ? Avoid any areas of skin that have broken skin, cuts, or scrapes. Before surgery Follow these steps when using a CHG cloth to clean before surgery (unless your health care provider gives you different instructions): 1. Using the CHG cloth, vigorously scrub the part of your body where you will be having  surgery. Scrub using a back-and-forth motion for 3 minutes. The area on your body should be completely wet with CHG when you are done scrubbing. 2. Do not rinse. Discard the cloth and let the area air-dry. Do not put any substances on the area afterward, such as powder, lotion, or perfume. 3. Put on clean clothes or pajamas. 4. If it is the night before your surgery, sleep in clean sheets.  For general bathing Follow these steps when using CHG cloths for general bathing (unless your health care provider gives you different instructions). 1. Use a separate CHG cloth for each area of your body. Make sure you wash between any folds of skin and between your fingers and toes. Wash your body in the following order, switching to a new cloth after each step: ? The front of your neck, shoulders, and chest. ? Both of your arms, under your arms, and your hands. ? Your stomach and groin area, avoiding the genitals. ? Your right leg and foot. ? Your left leg and foot. ? The back of your neck, your back, and your buttocks. 2. Do not rinse. Discard the cloth and let the area air-dry. Do not put any substances on your body afterward--such as powder, lotion, or perfume--unless you are told to do so by your health care provider. Only use lotions that are recommended by the manufacturer. 3. Put on clean clothes or pajamas. Contact a health care provider  if:  Your skin gets irritated after scrubbing.  You have questions about using your solution or cloth. Get help right away if:  Your eyes become very red or swollen.  Your eyes itch badly.  Your skin itches badly and is red or swollen.  Your hearing changes.  You have trouble seeing.  You have swelling or tingling in your mouth or throat.  You have trouble breathing.  You swallow any chlorhexidine. Summary  Chlorhexidine gluconate (CHG) is a germ-killing (antiseptic) solution that is used to clean the skin. Cleaning your skin with CHG may help to  lower your risk for infection.  You may be given CHG to use for bathing. It may be in a bottle or in a prepackaged cloth to use on your skin. Carefully follow your health care provider's instructions and the instructions on the product label.  Do not use CHG if you have a chlorhexidine allergy.  Contact your health care provider if your skin gets irritated after scrubbing. This information is not intended to replace advice given to you by your health care provider. Make sure you discuss any questions you have with your health care provider. Document Revised: 01/31/2019 Document Reviewed: 10/12/2017 Elsevier Patient Education  2020 [place] Anesthesia, Adult, Care After This sheet gives you information about how to care for yourself after your procedure. Your health care provider may also give you more specific instructions. If you have problems or questions, contact your health care provider. What can I expect after the procedure? After the procedure, the following side effects are common:  Pain or discomfort at the IV site.  Nausea.  Vomiting.  Sore throat.  Trouble concentrating.  Feeling cold or chills.  Weak or tired.  Sleepiness and fatigue.  Soreness and body aches. These side effects can affect parts of the body that were not involved in surgery. Follow these instructions at home:  For at least 24 hours after the procedure:  Have a responsible adult stay with you. It is important to have someone help care for you until you are awake and alert.  Rest as needed.  Do not: ? Participate in activities in which you could fall or become injured. ? Drive. ? Use heavy machinery. ? Drink alcohol. ? Take sleeping pills or medicines that cause drowsiness. ? Make important decisions or sign legal documents. ? Take care of children on your own. Eating and drinking  Follow any instructions from your health care provider about eating or drinking  restrictions.  When you feel hungry, start by eating small amounts of foods that are soft and easy to digest (bland), such as toast. Gradually return to your regular diet.  Drink enough fluid to keep your urine pale yellow.  If you vomit, rehydrate by drinking water, juice, or clear broth. General instructions  If you have sleep apnea, surgery and certain medicines can increase your risk for breathing problems. Follow instructions from your health care provider about wearing your sleep device: ? Anytime you are sleeping, including during daytime naps. ? While taking prescription pain medicines, sleeping medicines, or medicines that make you drowsy.  Return to your normal activities as told by your health care provider. Ask your health care provider what activities are safe for you.  Take over-the-counter and prescription medicines only as told by your health care provider.  If you smoke, do not smoke without supervision.  Keep all follow-up visits as told by your health care provider. This is important. Contact a health  care provider if:  You have nausea or vomiting that does not get better with medicine.  You cannot eat or drink without vomiting.  You have pain that does not get better with medicine.  You are unable to pass urine.  You develop a skin rash.  You have a fever.  You have redness around your IV site that gets worse. Get help right away if:  You have difficulty breathing.  You have chest pain.  You have blood in your urine or stool, or you vomit blood. Summary  After the procedure, it is common to have a sore throat or nausea. It is also common to feel tired.  Have a responsible adult stay with you for the first 24 hours after general anesthesia. It is important to have someone help care for you until you are awake and alert.  When you feel hungry, start by eating small amounts of foods that are soft and easy to digest (bland), such as toast. Gradually  return to your regular diet.  Drink enough fluid to keep your urine pale yellow.  Return to your normal activities as told by your health care provider. Ask your health care provider what activities are safe for you. This information is not intended to replace advice given to you by your health care provider. Make sure you discuss any questions you have with your health care provider. Document Revised: 11/17/2017 Document Reviewed: 06/30/2017 Elsevier Patient Education  [place].

## 2020-08-18 ENCOUNTER — Other Ambulatory Visit: Payer: Self-pay | Admitting: Orthopedic Surgery

## 2020-08-18 DIAGNOSIS — M25561 Pain in right knee: Secondary | ICD-10-CM

## 2020-08-18 DIAGNOSIS — M23321 Other meniscus derangements, posterior horn of medial meniscus, right knee: Secondary | ICD-10-CM

## 2020-08-18 NOTE — Addendum Note (Signed)
Addended byCandice Camp on: 08/18/2020 03:57 PM   Modules accepted: Orders

## 2020-08-20 DIAGNOSIS — K219 Gastro-esophageal reflux disease without esophagitis: Secondary | ICD-10-CM | POA: Diagnosis not present

## 2020-08-20 DIAGNOSIS — E7849 Other hyperlipidemia: Secondary | ICD-10-CM | POA: Diagnosis not present

## 2020-08-21 ENCOUNTER — Other Ambulatory Visit: Payer: Self-pay

## 2020-08-21 ENCOUNTER — Encounter (HOSPITAL_COMMUNITY): Payer: Self-pay

## 2020-08-21 ENCOUNTER — Other Ambulatory Visit (HOSPITAL_COMMUNITY)
Admission: RE | Admit: 2020-08-21 | Discharge: 2020-08-21 | Disposition: A | Discharge status: Home / Self Care | Payer: Medicaid Other | Source: Ambulatory Visit | Attending: Orthopedic Surgery | Admitting: Orthopedic Surgery

## 2020-08-21 ENCOUNTER — Encounter (HOSPITAL_COMMUNITY)
Admission: RE | Admit: 2020-08-21 | Discharge: 2020-08-21 | Disposition: A | Discharge status: Home / Self Care | Payer: Medicaid Other | Source: Ambulatory Visit | Attending: Orthopedic Surgery | Admitting: Orthopedic Surgery

## 2020-08-21 DIAGNOSIS — Z20822 Contact with and (suspected) exposure to covid-19: Secondary | ICD-10-CM | POA: Diagnosis not present

## 2020-08-21 DIAGNOSIS — Z01812 Encounter for preprocedural laboratory examination: Secondary | ICD-10-CM | POA: Diagnosis not present

## 2020-08-21 LAB — CBC WITH DIFFERENTIAL/PLATELET
Abs Immature Granulocytes: 0.02 10*3/uL (ref 0.00–0.07)
Basophils Absolute: 0.1 10*3/uL (ref 0.0–0.1)
Basophils Relative: 1 %
Eosinophils Absolute: 0.3 10*3/uL (ref 0.0–0.5)
Eosinophils Relative: 3 %
HCT: 44.2 % (ref 36.0–46.0)
Hemoglobin: 14.2 g/dL (ref 12.0–15.0)
Immature Granulocytes: 0 %
Lymphocytes Relative: 41 %
Lymphs Abs: 3.4 10*3/uL (ref 0.7–4.0)
MCH: 31.4 pg (ref 26.0–34.0)
MCHC: 32.1 g/dL (ref 30.0–36.0)
MCV: 97.8 fL (ref 80.0–100.0)
Monocytes Absolute: 0.8 10*3/uL (ref 0.1–1.0)
Monocytes Relative: 10 %
Neutro Abs: 3.8 10*3/uL (ref 1.7–7.7)
Neutrophils Relative %: 45 %
Platelets: 413 10*3/uL — ABNORMAL HIGH (ref 150–400)
RBC: 4.52 MIL/uL (ref 3.87–5.11)
RDW: 13.5 % (ref 11.5–15.5)
WBC: 8.4 10*3/uL (ref 4.0–10.5)
nRBC: 0 % (ref 0.0–0.2)

## 2020-08-21 LAB — BASIC METABOLIC PANEL
Anion gap: 9 (ref 5–15)
BUN: 7 mg/dL (ref 6–20)
CO2: 26 mmol/L (ref 22–32)
Calcium: 9.8 mg/dL (ref 8.9–10.3)
Chloride: 104 mmol/L (ref 98–111)
Creatinine, Ser: 0.49 mg/dL (ref 0.44–1.00)
GFR calc Af Amer: >60 mL/min (ref >60)
GFR calc non Af Amer: >60 mL/min (ref >60)
Glucose, Bld: 81 mg/dL (ref 70–99)
Potassium: 3.8 mmol/L (ref 3.5–5.1)
Sodium: 139 mmol/L (ref 135–145)

## 2020-08-21 LAB — SARS CORONAVIRUS 2 (TAT 6-24 HRS): SARS Coronavirus 2: NEGATIVE

## 2020-08-24 NOTE — H&P (Signed)
Chief Complaint  Patient presents with  . Knee Pain      right  . Results      review MRI       59 year old female had a tibial plateau fracture November 2021 seen by Dr. Case on several occasions.  The CT scan showed a nondisplaced lateral tibial plateau fracture and she was treated with a brace and the fracture healed however she had residual pain and presented to [place] for second opinion regarding that.   Symptoms seem to suggest she may have meniscal pathology and she was sent for MRI which show she has a torn medial meniscus tear at the red red to red-white junction and it is amenable to repair possibly   She is still having pain and would like to proceed with surgery.ROS       Past Medical History:  Diagnosis Date  . Brain bleed ([place]) 2012  . Cervical cancer ([place])    . COPD (chronic obstructive pulmonary disease) ([place])    . Lung cancer ([place]) 2008    reports one small spot seen on her lungs in the past. Denies daignosis of cancer.   . Lung infection             Past Surgical History:  Procedure Laterality Date  . BIOPSY   02/04/2020    Procedure: BIOPSY;  Surgeon: Danie Binder, MD;  Location: [facility];  Service: Endoscopy;;  . COLONOSCOPY WITH PROPOFOL N/A 02/04/2020    Procedure: COLONOSCOPY WITH PROPOFOL;  Surgeon: Danie Binder, MD;  Location: [facility];  Service: Endoscopy;  Laterality: N/A;  8:45am  . MULTIPLE TOOTH EXTRACTIONS      . POLYPECTOMY   02/04/2020    Procedure: POLYPECTOMY;  Surgeon: Danie Binder, MD;  Location: [facility];  Service: Endoscopy;;  . rod in arm               Family History  Problem Relation Age of Onset  . Throat cancer Mother    . Lung cancer Father    . Throat cancer Brother    . Lung cancer Brother    . Colon cancer Neg Hx        Social History         Tobacco Use  . Smoking status: Current Every Day Smoker      Packs/day: 1.00      Years: 35.00      Pack years: 35.00      Types: Cigarettes  . Smokeless  tobacco: Never Used  Vaping Use  . Vaping Use: Never used  Substance Use Topics  . Alcohol use: Not Currently      Comment: "not as much"  . Drug use: No      BP (!) 147/104   Pulse 80   Ht 5\' 2"  (1.575 m)   Wt 83 lb (37.6 kg)   LMP 10/08/2019 Comment: Still spotting since November 10  BMI 15.18 kg/m    Positive exam findings reveal tenderness on the medial joint line of the right knee with normal range of motion no crepitance strength and muscle tone are normal she is able to walk with a normal gait there is no effusion   Outside images MRI does show a peripheral meniscal tear the medial meniscus a healed lateral plateau fracture with minimal angulation   The procedure has been fully reviewed with the patient; The risks and benefits of surgery have been discussed and explained and understood. Alternative  treatment has also been reviewed, questions were encouraged and answered. The postoperative plan is also been reviewed.   Patient aware that the repair can fail and would require subsequent surgery   She was in the emergency room on August 26 concerned about the pain in her chest which was actually posterior upper back pain and it worked up as no problems with her heart     Encounter Diagnoses  Name Primary?  . Chronic pain of right knee    . Derangement of posterior horn of medial meniscus of right knee Yes  . Closed fracture of right tibial plateau, sequela          Arthroscopy right knee with meniscal repair medial meniscus

## 2020-08-25 ENCOUNTER — Encounter: Payer: Self-pay | Admitting: Orthopedic Surgery

## 2020-08-25 ENCOUNTER — Encounter (HOSPITAL_COMMUNITY): Payer: Self-pay | Admitting: Orthopedic Surgery

## 2020-08-25 ENCOUNTER — Encounter (HOSPITAL_COMMUNITY)
Admission: RE | Disposition: A | Discharge status: Home / Self Care | Payer: Self-pay | Source: Home / Self Care | Attending: Orthopedic Surgery

## 2020-08-25 ENCOUNTER — Ambulatory Visit (HOSPITAL_COMMUNITY)
Admission: RE | Admit: 2020-08-25 | Discharge: 2020-08-25 | Disposition: A | Discharge status: Home / Self Care | Payer: Medicaid Other | Attending: Orthopedic Surgery | Admitting: Orthopedic Surgery

## 2020-08-25 ENCOUNTER — Ambulatory Visit (HOSPITAL_COMMUNITY): Payer: Medicaid Other | Admitting: Anesthesiology

## 2020-08-25 DIAGNOSIS — F17210 Nicotine dependence, cigarettes, uncomplicated: Secondary | ICD-10-CM | POA: Insufficient documentation

## 2020-08-25 DIAGNOSIS — I10 Essential (primary) hypertension: Secondary | ICD-10-CM | POA: Diagnosis not present

## 2020-08-25 DIAGNOSIS — Z8673 Personal history of transient ischemic attack (TIA), and cerebral infarction without residual deficits: Secondary | ICD-10-CM | POA: Insufficient documentation

## 2020-08-25 DIAGNOSIS — S83242A Other tear of medial meniscus, current injury, left knee, initial encounter: Secondary | ICD-10-CM

## 2020-08-25 DIAGNOSIS — G8929 Other chronic pain: Secondary | ICD-10-CM | POA: Diagnosis not present

## 2020-08-25 DIAGNOSIS — S83242D Other tear of medial meniscus, current injury, left knee, subsequent encounter: Secondary | ICD-10-CM | POA: Diagnosis not present

## 2020-08-25 DIAGNOSIS — S83241A Other tear of medial meniscus, current injury, right knee, initial encounter: Secondary | ICD-10-CM | POA: Diagnosis not present

## 2020-08-25 DIAGNOSIS — J449 Chronic obstructive pulmonary disease, unspecified: Secondary | ICD-10-CM | POA: Insufficient documentation

## 2020-08-25 DIAGNOSIS — Z8541 Personal history of malignant neoplasm of cervix uteri: Secondary | ICD-10-CM | POA: Diagnosis not present

## 2020-08-25 DIAGNOSIS — M23221 Derangement of posterior horn of medial meniscus due to old tear or injury, right knee: Secondary | ICD-10-CM | POA: Diagnosis present

## 2020-08-25 HISTORY — PX: KNEE ARTHROSCOPY WITH MENISCAL REPAIR: SHX5653

## 2020-08-25 HISTORY — DX: Essential (primary) hypertension: I10

## 2020-08-25 SURGERY — ARTHROSCOPY, KNEE, WITH MENISCUS REPAIR
Anesthesia: General | Site: Knee | Laterality: Right

## 2020-08-25 MED ORDER — FENTANYL CITRATE (PF) 100 MCG/2ML IJ SOLN
INTRAMUSCULAR | Status: DC | PRN
Start: 2020-08-25 — End: 2020-08-25
  Administered 2020-08-25: 100 ug via INTRAVENOUS
  Administered 2020-08-25 (×3): 50 ug via INTRAVENOUS

## 2020-08-25 MED ORDER — EPHEDRINE 5 MG/ML INJ
INTRAVENOUS | Status: AC
  Filled 2020-08-25: qty 10

## 2020-08-25 MED ORDER — BUPIVACAINE-EPINEPHRINE (PF) 0.5% -1:200000 IJ SOLN
INTRAMUSCULAR | Status: DC | PRN
  Administered 2020-08-25: 30 mL via PERINEURAL

## 2020-08-25 MED ORDER — PHENYLEPHRINE 40 MCG/ML (10ML) SYRINGE FOR IV PUSH (FOR BLOOD PRESSURE SUPPORT)
PREFILLED_SYRINGE | INTRAVENOUS | Status: AC
  Filled 2020-08-25: qty 10

## 2020-08-25 MED ORDER — PROMETHAZINE HCL 25 MG/ML IJ SOLN: 6.2500 mg | INTRAMUSCULAR | Status: DC | PRN

## 2020-08-25 MED ORDER — ONDANSETRON HCL 4 MG/2ML IJ SOLN
INTRAMUSCULAR | Status: DC | PRN
  Administered 2020-08-25: 4 mg via INTRAVENOUS

## 2020-08-25 MED ORDER — HYDROCODONE-ACETAMINOPHEN 7.5-325 MG PO TABS
1.0000 | ORAL_TABLET | Freq: Once | ORAL | Status: AC
  Administered 2020-08-25: 1 via ORAL
  Filled 2020-08-25: qty 1

## 2020-08-25 MED ORDER — DEXAMETHASONE SODIUM PHOSPHATE 10 MG/ML IJ SOLN
INTRAMUSCULAR | Status: DC | PRN
  Administered 2020-08-25: 4 mg via INTRAVENOUS

## 2020-08-25 MED ORDER — DEXAMETHASONE SODIUM PHOSPHATE 10 MG/ML IJ SOLN
INTRAMUSCULAR | Status: AC
  Filled 2020-08-25: qty 1

## 2020-08-25 MED ORDER — PROPOFOL 10 MG/ML IV BOLUS
INTRAVENOUS | Status: AC
  Filled 2020-08-25: qty 20

## 2020-08-25 MED ORDER — SODIUM CHLORIDE 0.9 % IR SOLN
Status: DC | PRN
  Administered 2020-08-25: 3000 mL

## 2020-08-25 MED ORDER — PROPOFOL 10 MG/ML IV BOLUS
INTRAVENOUS | Status: DC | PRN
  Administered 2020-08-25: 120 mg via INTRAVENOUS

## 2020-08-25 MED ORDER — HYDROMORPHONE HCL 1 MG/ML IJ SOLN: 0.2500 mg | INTRAMUSCULAR | Status: DC | PRN

## 2020-08-25 MED ORDER — VANCOMYCIN HCL IN DEXTROSE 1-5 GM/200ML-% IV SOLN
1000.0000 mg | INTRAVENOUS | Status: AC
  Administered 2020-08-25: 1000 mg via INTRAVENOUS
  Filled 2020-08-25: qty 200

## 2020-08-25 MED ORDER — MIDAZOLAM HCL 2 MG/2ML IJ SOLN
INTRAMUSCULAR | Status: AC
  Filled 2020-08-25: qty 2

## 2020-08-25 MED ORDER — BUPIVACAINE-EPINEPHRINE (PF) 0.5% -1:200000 IJ SOLN
INTRAMUSCULAR | Status: AC
  Filled 2020-08-25: qty 30

## 2020-08-25 MED ORDER — PHENYLEPHRINE HCL (PRESSORS) 10 MG/ML IV SOLN
INTRAVENOUS | Status: DC | PRN
  Administered 2020-08-25: 160 ug via INTRAVENOUS
  Administered 2020-08-25 (×2): 120 ug via INTRAVENOUS
  Administered 2020-08-25: 160 ug via INTRAVENOUS
  Administered 2020-08-25 (×2): 120 ug via INTRAVENOUS

## 2020-08-25 MED ORDER — FENTANYL CITRATE (PF) 250 MCG/5ML IJ SOLN
INTRAMUSCULAR | Status: AC
  Filled 2020-08-25: qty 5

## 2020-08-25 MED ORDER — MEPERIDINE HCL 50 MG/ML IJ SOLN: 6.2500 mg | INTRAMUSCULAR | Status: DC | PRN

## 2020-08-25 MED ORDER — CHLORHEXIDINE GLUCONATE 0.12 % MT SOLN
15.0000 mL | Freq: Once | OROMUCOSAL | Status: AC
  Administered 2020-08-25: 15 mL via OROMUCOSAL

## 2020-08-25 MED ORDER — LIDOCAINE 2% (20 MG/ML) 5 ML SYRINGE
INTRAMUSCULAR | Status: AC
  Filled 2020-08-25: qty 5

## 2020-08-25 MED ORDER — ONDANSETRON HCL 4 MG/2ML IJ SOLN
4.0000 mg | Freq: Once | INTRAMUSCULAR | Status: AC
  Administered 2020-08-25: 4 mg via INTRAVENOUS
  Filled 2020-08-25: qty 2

## 2020-08-25 MED ORDER — LACTATED RINGERS IV SOLN: Freq: Once | INTRAVENOUS | Status: AC

## 2020-08-25 MED ORDER — LIDOCAINE HCL (CARDIAC) PF 100 MG/5ML IV SOSY
PREFILLED_SYRINGE | INTRAVENOUS | Status: DC | PRN
  Administered 2020-08-25: 50 mg via INTRAVENOUS

## 2020-08-25 MED ORDER — SODIUM CHLORIDE 0.9 % IR SOLN
Status: DC | PRN
  Administered 2020-08-25: 1000 mL

## 2020-08-25 MED ORDER — ONDANSETRON HCL 4 MG/2ML IJ SOLN
INTRAMUSCULAR | Status: AC
  Filled 2020-08-25: qty 2

## 2020-08-25 MED ORDER — LACTATED RINGERS IV SOLN: INTRAVENOUS | Status: DC | PRN

## 2020-08-25 MED ORDER — ORAL CARE MOUTH RINSE: 15.0000 mL | Freq: Once | OROMUCOSAL | Status: AC

## 2020-08-25 MED ORDER — IBUPROFEN 400 MG PO TABS
400.0000 mg | ORAL_TABLET | Freq: Once | ORAL | Status: AC
  Administered 2020-08-25: 400 mg via ORAL
  Filled 2020-08-25: qty 1

## 2020-08-25 MED ORDER — EPINEPHRINE PF 1 MG/ML IJ SOLN
INTRAMUSCULAR | Status: AC
  Filled 2020-08-25: qty 6

## 2020-08-25 MED ORDER — MIDAZOLAM HCL 5 MG/5ML IJ SOLN
INTRAMUSCULAR | Status: DC | PRN
  Administered 2020-08-25: 2 mg via INTRAVENOUS

## 2020-08-25 SURGICAL SUPPLY — 50 items
ABLATOR ASPIRATE 50D MULTI-PRT (SURGICAL WAND) ×3 IMPLANT
ANCH SUT STRG 2-0 FBRSTCH (Anchor) ×2 IMPLANT
ANCHOR SUT MENISCAL STR 2-0 (Anchor) ×6 IMPLANT
APL PRP STRL LF DISP 70% ISPRP (MISCELLANEOUS) ×2
BLADE SURG SZ11 CARB STEEL (BLADE) ×3 IMPLANT
BNDG CMPR STD VLCR NS LF 5.8X6 (GAUZE/BANDAGES/DRESSINGS) ×2
BNDG ELASTIC 6X5.8 VLCR NS LF (GAUZE/BANDAGES/DRESSINGS) ×6 IMPLANT
CHLORAPREP W/TINT 26 (MISCELLANEOUS) ×6 IMPLANT
CLOSURE WOUND 1/2 X4 (GAUZE/BANDAGES/DRESSINGS) ×1
CLOTH BEACON ORANGE TIMEOUT ST (SAFETY) ×3 IMPLANT
COOLER ICEMAN CLASSIC (MISCELLANEOUS) ×3 IMPLANT
COVER WAND RF STERILE (DRAPES) ×3 IMPLANT
CUFF TOURN SGL QUICK 24 (TOURNIQUET CUFF) ×3
CUFF TOURN SGL QUICK 34 (TOURNIQUET CUFF)
CUFF TRNQT CYL 24X4X16.5-23 (TOURNIQUET CUFF) ×1 IMPLANT
CUFF TRNQT CYL 34X4.125X (TOURNIQUET CUFF) IMPLANT
CUTTER TENSIONER SUT 2-0 0 FBW (INSTRUMENTS) ×6 IMPLANT
DECANTER SPIKE VIAL GLASS SM (MISCELLANEOUS) ×6 IMPLANT
DRSG PAD ABDOMINAL 8X10 ST (GAUZE/BANDAGES/DRESSINGS) ×3 IMPLANT
GAUZE 4X4 16PLY RFD (DISPOSABLE) ×3 IMPLANT
GAUZE XEROFORM 5X9 LF (GAUZE/BANDAGES/DRESSINGS) ×6 IMPLANT
GLOVE BIOGEL PI IND STRL 7.0 (GLOVE) ×1 IMPLANT
GLOVE BIOGEL PI INDICATOR 7.0 (GLOVE) ×2
GLOVE SKINSENSE NS SZ8.0 LF (GLOVE) ×2
GLOVE SKINSENSE STRL SZ8.0 LF (GLOVE) ×1 IMPLANT
GLOVE SS N UNI LF 8.5 STRL (GLOVE) ×3 IMPLANT
GOWN STRL REUS W/TWL LRG LVL3 (GOWN DISPOSABLE) ×3 IMPLANT
GOWN STRL REUS W/TWL XL LVL3 (GOWN DISPOSABLE) ×3 IMPLANT
IV NS IRRIG 3000ML ARTHROMATIC (IV SOLUTION) ×6 IMPLANT
KIT BLADEGUARD II DBL (SET/KITS/TRAYS/PACK) ×3 IMPLANT
KIT TURNOVER CYSTO (KITS) ×3 IMPLANT
MANIFOLD NEPTUNE II (INSTRUMENTS) ×3 IMPLANT
MARKER SKIN DUAL TIP RULER LAB (MISCELLANEOUS) ×3 IMPLANT
NEEDLE HYPO 18GX1.5 BLUNT FILL (NEEDLE) ×3 IMPLANT
NEEDLE HYPO 21X1.5 SAFETY (NEEDLE) ×3 IMPLANT
NEEDLE SPNL 18GX3.5 QUINCKE PK (NEEDLE) ×3 IMPLANT
NS IRRIG 1000ML POUR BTL (IV SOLUTION) ×3 IMPLANT
PACK ARTHRO LIMB DRAPE STRL (MISCELLANEOUS) ×3 IMPLANT
PAD ABD 5X9 TENDERSORB (GAUZE/BANDAGES/DRESSINGS) ×3 IMPLANT
PAD ARMBOARD 7.5X6 YLW CONV (MISCELLANEOUS) ×3 IMPLANT
PAD COLD SHLDR WRAP-ON (PAD) ×3 IMPLANT
PADDING CAST COTTON 6X4 STRL (CAST SUPPLIES) ×3 IMPLANT
PORT APPOLLO RF 90DEGREE MULTI (SURGICAL WAND) ×3 IMPLANT
SET ARTHROSCOPY INST (INSTRUMENTS) ×3 IMPLANT
SET BASIN LINEN APH (SET/KITS/TRAYS/PACK) ×3 IMPLANT
STRIP CLOSURE SKIN 1/2X4 (GAUZE/BANDAGES/DRESSINGS) ×2 IMPLANT
SUT ETHILON 3 0 FSL (SUTURE) IMPLANT
SYR 10ML LL (SYRINGE) ×3 IMPLANT
SYR 30ML LL (SYRINGE) ×3 IMPLANT
YANKAUER SUCT BULB TIP 10FT TU (MISCELLANEOUS) ×9 IMPLANT

## 2020-08-25 NOTE — Anesthesia Postprocedure Evaluation (Signed)
Anesthesia Post Note  Patient: ARADHYA SHELLENBARGER  Procedure(s) Performed: KNEE ARTHROSCOPY WITH  MEDIAL MENISCAL REPAIR (Right Knee)  Patient location during evaluation: PACU Anesthesia Type: General Level of consciousness: awake, oriented, awake and alert and patient cooperative Pain management: satisfactory to patient Vital Signs Assessment: post-procedure vital signs reviewed and stable Respiratory status: spontaneous breathing, respiratory function stable, nonlabored ventilation and patient connected to face mask oxygen Cardiovascular status: stable Postop Assessment: no apparent nausea or vomiting Anesthetic complications: no   No complications documented.   Last Vitals:  Vitals:   08/25/20 0647  BP: 126/75  Pulse: 75  Resp: 18  Temp: 36.4 C  SpO2: 99%    Last Pain:  Vitals:   08/25/20 0647  TempSrc: Oral  PainSc: 0-No pain                 Azarius Lambson

## 2020-08-25 NOTE — Brief Op Note (Signed)
08/25/2020  8:45 AM  PATIENT:  Beth [name]  59 y.o. female  PRE-OPERATIVE DIAGNOSIS:  right knee medial meniscus tear  POST-OPERATIVE DIAGNOSIS:  right knee medial meniscus tear  PROCEDURE:  Procedure(s): KNEE ARTHROSCOPY WITH  MEDIAL MENISCAL REPAIR (Right)   Implants: ARTHREX MENISCUS CINCH(2)  X 2   Findings at surgery undersurface meniscus tear 1.5 cm posterior horn medial meniscus red-white junction  Articular surfaces normal ACL PCL normal  SURGEON:  Surgeon(s) and Role:    Carole Civil, MD - Primary  The surgery was done as follows The patient was seen in the preop area properly identified and chart review completed MRI reviewed.  Surgical site marked right knee.  Patient taken to surgery.  General anesthesia.  Brief exam under anesthesia was normal.  Right leg was prepped and draped sterilely timeout was completed  Standard lateral arthroscopy portal was established the scope was placed into the joint and a diagnostic arthroscopy was completed.  We found an undersurface tear of the medial meniscus approximately 1.5 cm in length it was not full-thickness it was in the red-white junction  The remaining portions of the joint were normal  We established a medial portal with a spinal needle and then evaluated the meniscus tear.  To preserve the joint I decided to repair the meniscus tear.  We used a meniscus cinch to with 2 vertical mattress sutures.  We prepared the meniscus with a shaver and multiple spinal needle punctures in the meniscus capsular junction to stimulate bleeding.  We also performed a release of the inner portion of the MCL to open up the joint.  We then placed 1 suture posteriorly and then a second suture more anteriorly  A probe was used to evaluate the tear and it was repaired and it was stable  The joint was irrigated and the portals were closed with 3-0 nylon suture the joint was injected with 30 cc of Marcaine   PHYSICIAN ASSISTANT:    ASSISTANTS: Fulton Mole  ANESTHESIA:   general  EBL:  10 mL   BLOOD ADMINISTERED:none  DRAINS: none   LOCAL MEDICATIONS USED:  MARCAINE     SPECIMEN:  No Specimen  DISPOSITION OF SPECIMEN:  N/A  COUNTS:  YES  TOURNIQUET:  * Missing tourniquet times found for documented tourniquets in log: [id number] *  DICTATION: .Dragon Dictation  PLAN OF CARE: Discharge to home after PACU  PATIENT DISPOSITION:  PACU - hemodynamically stable.   Delay start of Pharmacological VTE agent (>24hrs) due to surgical blood loss or risk of bleeding: NO

## 2020-08-25 NOTE — Interval H&P Note (Signed)
History and Physical Interval Note:  08/25/2020 7:23 AM  Beth [name]  has presented today for surgery, with the diagnosis of right knee medial meniscus tear.  The various methods of treatment have been discussed with the patient and family. After consideration of risks, benefits and other options for treatment, the patient has consented to  Procedure(s): KNEE ARTHROSCOPY WITH MENISCAL REPAIR (Right) as a surgical intervention.  The patient's history has been reviewed, patient examined, no change in status, stable for surgery.  I have reviewed the patient's chart and labs.  Questions were answered to the patient's satisfaction.     Arther Abbott

## 2020-08-25 NOTE — Anesthesia Preprocedure Evaluation (Signed)
Anesthesia Evaluation  Patient identified by MRN, date of birth, ID band Patient awake    Reviewed: Allergy & Precautions, NPO status , Patient's Chart, lab work & pertinent test results  History of Anesthesia Complications Negative for: history of anesthetic complications  Airway Mallampati: II  TM Distance: >3 FB Neck ROM: Full    Dental  (+) Edentulous Upper, Edentulous Lower   Pulmonary COPD,  COPD inhaler, Current Smoker and Patient abstained from smoking.,  Lung cancer   Pulmonary exam normal breath sounds clear to auscultation       Cardiovascular Exercise Tolerance: Good hypertension, Pt. on medications Normal cardiovascular exam Rhythm:Regular Rate:Normal     Neuro/Psych PSYCHIATRIC DISORDERS CVA    GI/Hepatic negative GI ROS, (+)     substance abuse (alccohol abuse, polysubstance use, last use of marijuana 08/24/20)  alcohol use and marijuana use,   Endo/Other  negative endocrine ROS  Renal/GU negative Renal ROS     Musculoskeletal Cervical vertebra fracture   Abdominal   Peds  Hematology negative hematology ROS (+)   Anesthesia Other Findings   Reproductive/Obstetrics negative OB ROS                            Anesthesia Physical Anesthesia Plan  ASA: III  Anesthesia Plan: General   Post-op Pain Management:    Induction: Intravenous  PONV Risk Score and Plan: 3  Airway Management Planned: LMA  Additional Equipment:   Intra-op Plan:   Post-operative Plan: Extubation in OR  Informed Consent: I have reviewed the patients History and Physical, chart, labs and discussed the procedure including the risks, benefits and alternatives for the proposed anesthesia with the patient or authorized representative who has indicated his/her understanding and acceptance.       Plan Discussed with: CRNA and Surgeon  Anesthesia Plan Comments:        Anesthesia Quick  Evaluation

## 2020-08-25 NOTE — Addendum Note (Signed)
Addendum  created 08/25/20 1144 by Jonna Munro, CRNA   Charge Capture section accepted

## 2020-08-25 NOTE — Discharge Instructions (Signed)
General Anesthesia, Adult, Care After This sheet gives you information about how to care for yourself after your procedure. Your health care provider may also give you more specific instructions. If you have problems or questions, contact your health care provider. What can I expect after the procedure? After the procedure, the following side effects are common:  Pain or discomfort at the IV site.  Nausea.  Vomiting.  Sore throat.  Trouble concentrating.  Feeling cold or chills.  Weak or tired.  Sleepiness and fatigue.  Soreness and body aches. These side effects can affect parts of the body that were not involved in surgery. Follow these instructions at home:  For at least 24 hours after the procedure:  Have a responsible adult stay with you. It is important to have someone help care for you until you are awake and alert.  Rest as needed.  Do not: ? Participate in activities in which you could fall or become injured. ? Drive. ? Use heavy machinery. ? Drink alcohol. ? Take sleeping pills or medicines that cause drowsiness. ? Make important decisions or sign legal documents. ? Take care of children on your own. Eating and drinking  Follow any instructions from your health care provider about eating or drinking restrictions.  When you feel hungry, start by eating small amounts of foods that are soft and easy to digest (bland), such as toast. Gradually return to your regular diet.  Drink enough fluid to keep your urine pale yellow.  If you vomit, rehydrate by drinking water, juice, or clear broth. General instructions  If you have sleep apnea, surgery and certain medicines can increase your risk for breathing problems. Follow instructions from your health care provider about wearing your sleep device: ? Anytime you are sleeping, including during daytime naps. ? While taking prescription pain medicines, sleeping medicines, or medicines that make you drowsy.  Return to  your normal activities as told by your health care provider. Ask your health care provider what activities are safe for you.  Take over-the-counter and prescription medicines only as told by your health care provider.  If you smoke, do not smoke without supervision.  Keep all follow-up visits as told by your health care provider. This is important. Contact a health care provider if:  You have nausea or vomiting that does not get better with medicine.  You cannot eat or drink without vomiting.  You have pain that does not get better with medicine.  You are unable to pass urine.  You develop a skin rash.  You have a fever.  You have redness around your IV site that gets worse. Get help right away if:  You have difficulty breathing.  You have chest pain.  You have blood in your urine or stool, or you vomit blood. Summary  After the procedure, it is common to have a sore throat or nausea. It is also common to feel tired.  Have a responsible adult stay with you for the first 24 hours after general anesthesia. It is important to have someone help care for you until you are awake and alert.  When you feel hungry, start by eating small amounts of foods that are soft and easy to digest (bland), such as toast. Gradually return to your regular diet.  Drink enough fluid to keep your urine pale yellow.  Return to your normal activities as told by your health care provider. Ask your health care provider what activities are safe for you. This information is not  intended to replace advice given to you by your health care provider. Make sure you discuss any questions you have with your health care provider. Document Revised: 11/17/2017 Document Reviewed: 06/30/2017 Elsevier Patient Education  [place]. Knee Arthroscopy, Care After This sheet gives you information about how to care for yourself after your procedure. Your health care provider may also give you more specific  instructions. If you have problems or questions, contact your health care provider. What can I expect after the procedure? After the procedure, it is common to have:  Soreness.  Swelling.  Pain that can be relieved by taking pain medicine. Follow these instructions at home: Incision care   Follow instructions from your health care provider about how to take care of your incisions. Make sure you: ? Wash your hands with soap and water before you change your bandage (dressing). If soap and water are not available, use hand sanitizer. ? Change your dressing as told by your health care provider. ? Leave stitches (sutures), staples, skin glue, or adhesive strips in place. These skin closures may need to stay in place for 2 weeks or longer. If adhesive strip edges start to loosen and curl up, you may trim the loose edges. Do not remove adhesive strips completely unless your health care provider tells you to do that.  Check your incision areas every day for signs of infection. Check for: ? Redness. ? More swelling or pain. ? Fluid or blood. ? Warmth. ? Pus or a bad smell. Bathing  Do not take baths, swim, or use a hot tub until your health care provider approves. Ask your health care provider if you may take showers. You may only be allowed to take sponge baths. Activity  Do not use your knee to support your body weight until your health care provider says that you can. Follow weight-bearing restrictions as told. Use crutches or other devices to help you move around (assistive devices) as directed.  Ask your health care provider what activities are safe for you during recovery, and what activities you need to avoid.  If physical therapy was prescribed, do exercises as directed. Doing exercises may help improve knee movement and flexibility (range of motion).  Do not lift anything that is heavier than 10 lb (4.5 kg), or the limit that you are told, until your health care provider says that  it is safe. Driving  Do not drive until your health care provider approves. You may be able to drive after 1-3 weeks.  Do not drive or use heavy machinery while taking prescription pain medicine. Managing pain, stiffness, and swelling   If directed, put ice on the injured area: ? Put ice in a plastic bag or use the icing device (cold therapy unit) that you were given. Follow instructions from your health care provider about how to use the icing device. ? Place a towel between your skin and the bag or between your skin and the icing device. ? Leave the ice on for 20 minutes, 2-3 times a day.  Move your toes often to avoid stiffness and to lessen swelling.  Raise (elevate) the injured area above the level of your heart while you are sitting or lying down. If you are taking blood thinners:  Before you take any medicines that contain aspirin or NSAIDs, talk with your health care provider. These medicines increase your risk for dangerous bleeding.  Take your medicine exactly as told, at the same time every day.  Avoid activities  that could cause injury or bruising, and follow instructions about how to prevent falls.  Wear a medical alert bracelet or carry a card that lists what medicines you take. General instructions  Take over-the-counter and prescription medicines only as told by your health care provider.  If you are taking prescription pain medicine, take actions to prevent or treat constipation. Your health care provider may recommend that you: ? Drink enough fluid to keep your urine pale yellow. ? Eat foods that are high in fiber, such as fresh fruits and vegetables, whole grains, and beans. ? Limit foods that are high in fat and processed sugars, such as fried or sweet foods. ? Take an over-the-counter or prescription medicines for constipation.  Do not use any products that contain nicotine or tobacco, such as cigarettes and e-cigarettes. These can delay incision or bone  healing. If you need help quitting, ask your health care provider.  Wear compression stockings as told by your health care provider. These stockings help to prevent blood clots and reduce swelling in your legs.  Keep all follow-up visits as told by your health care provider. This is important. Contact a health care provider if you:  Have a fever.  Have severe pain.  Have redness around an incision.  Have more swelling.  Have fluid or blood coming from an incision.  Notice that an incision feels warm to the touch.  Notice pus or a bad smell coming from an incision.  Notice that an incision opens up.  Develop a rash. Get help right away if you:  Have difficulty breathing.  Have shortness of breath.  Have chest pain.  Develop pain in your lower leg or at the back of your knee.  Have numbness or tingling in your lower leg or your foot. Summary  Raise (elevate) the injured area above the level of your heart while you are sitting or lying down.  To help relieve pain and swelling, put ice on your leg for 20 minutes at a time, 2-3 times a day.  If you were prescribed a blood thinner, avoid activities that could cause injury or bruising, and follow instructions about how to prevent falls.  If physical therapy was prescribed, do exercises as directed. Doing exercises may help improve range of motion. This information is not intended to replace advice given to you by your health care provider. Make sure you discuss any questions you have with your health care provider. Document Revised: 10/27/2017 Document Reviewed: 09/27/2017 Elsevier Patient Education  2020 [employer].

## 2020-08-25 NOTE — Brief Op Note (Signed)
08/25/2020  8:41 AM  PATIENT:  Beth [name]  59 y.o. female  PRE-OPERATIVE DIAGNOSIS:  right knee medial meniscus tear  POST-OPERATIVE DIAGNOSIS:  right knee medial meniscus tear  PROCEDURE:  Procedure(s): KNEE ARTHROSCOPY WITH  MEDIAL MENISCAL REPAIR (Right)   Implants: ARTHREX MENISCUS CINCH(2)  X 2   Findings at surgery undersurface meniscus tear 1.5 cm posterior horn medial meniscus red-white junction  Articular surfaces normal ACL PCL normal  SURGEON:  Surgeon(s) and Role:    Carole Civil, MD - Primary  The surgery was done as follows The patient was seen in the preop area properly identified and chart review completed MRI reviewed.  Surgical site marked right knee.  Patient taken to surgery.  General anesthesia.  Brief exam under anesthesia was normal.  Right leg was prepped and draped sterilely timeout was completed  Standard lateral arthroscopy portal was established the scope was placed into the joint and a diagnostic arthroscopy was completed.  We found an undersurface tear of the medial meniscus approximately 1.5 cm in length it was not full-thickness it was in the red-white junction  The remaining portions of the joint were normal  We established a medial portal with a spinal needle and then evaluated the meniscus tear.  To preserve the joint I decided to repair the meniscus tear.  We used a meniscus cinch to with 2 vertical mattress sutures.  We prepared the meniscus with a shaver and multiple spinal needle punctures in the meniscus capsular junction to stimulate bleeding.  We also performed a release of the inner portion of the MCL to open up the joint.  We then placed 1 suture posteriorly and then a second suture more anteriorly  A probe was used to evaluate the tear and it was repaired and it was stable  The joint was irrigated and the portals were closed with 3-0 nylon suture the joint was injected with 30 cc of Marcaine   PHYSICIAN ASSISTANT:    ASSISTANTS: Fulton Mole  ANESTHESIA:   general  EBL:  10 mL   BLOOD ADMINISTERED:none  DRAINS: none   LOCAL MEDICATIONS USED:  MARCAINE     SPECIMEN:  No Specimen  DISPOSITION OF SPECIMEN:  N/A  COUNTS:  YES  TOURNIQUET:  * Missing tourniquet times found for documented tourniquets in log: [id number] *  DICTATION: .Dragon Dictation  PLAN OF CARE: Discharge to home after PACU  PATIENT DISPOSITION:  PACU - hemodynamically stable.   Delay start of Pharmacological VTE agent (>24hrs) due to surgical blood loss or risk of bleeding: NO

## 2020-08-25 NOTE — Anesthesia Procedure Notes (Signed)
Procedure Name: LMA Insertion Date/Time: 08/25/2020 7:37 AM Performed by: Jonna Munro, CRNA Pre-anesthesia Checklist: Patient identified, Emergency Drugs available, Suction available, Patient being monitored and Timeout performed Patient Re-evaluated:Patient Re-evaluated prior to induction Oxygen Delivery Method: Circle system utilized Preoxygenation: Pre-oxygenation with 100% oxygen Induction Type: IV induction LMA: LMA inserted LMA Size: 4.0 Number of attempts: 1 Placement Confirmation: positive ETCO2 and breath sounds checked- equal and bilateral Tube secured with: Tape Dental Injury: Teeth and Oropharynx as per pre-operative assessment

## 2020-08-25 NOTE — Op Note (Addendum)
08/25/2020  8:45 AM  PATIENT:  Beth [name]  59 y.o. female  PRE-OPERATIVE DIAGNOSIS:  right knee medial meniscus tear  POST-OPERATIVE DIAGNOSIS:  right knee medial meniscus tear  PROCEDURE:  Procedure(s): KNEE ARTHROSCOPY WITH  MEDIAL MENISCAL REPAIR (Right)   Implants: ARTHREX MENISCUS fiber stitch : 2 sutures  Findings at surgery undersurface meniscus tear 1.5 cm posterior horn medial meniscus red-white junction  Articular surfaces normal ACL PCL normal  SURGEON:  Surgeon(s) and Role:    Carole Civil, MD - Primary  The surgery was done as follows The patient was seen in the preop area properly identified and chart review completed MRI reviewed.  Surgical site marked right knee.  Patient taken to surgery.  General anesthesia.  Brief exam under anesthesia was normal.  Right leg was prepped and draped sterilely timeout was completed  Standard lateral arthroscopy portal was established the scope was placed into the joint and a diagnostic arthroscopy was completed.  We found an undersurface tear of the medial meniscus approximately 1.5 cm in length it was not full-thickness it was in the red-white junction  The remaining portions of the joint were normal  We established a medial portal with a spinal needle and then evaluated the meniscus tear.  To preserve the joint I decided to repair the meniscus tear.  We used a meniscus cinch to with 2 vertical mattress sutures.  We prepared the meniscus with a shaver and multiple spinal needle punctures in the meniscus capsular junction to stimulate bleeding.  We also performed a release of the inner portion of the MCL to open up the joint.  We then placed 1 suture posteriorly and then a second suture more anteriorly  A probe was used to evaluate the tear and it was repaired and it was stable  The joint was irrigated and the portals were closed with 3-0 nylon suture the joint was injected with 30 cc of Marcaine   PHYSICIAN ASSISTANT:    ASSISTANTS: Fulton Mole  ANESTHESIA:   general  EBL:  10 mL   BLOOD ADMINISTERED:none  DRAINS: none   LOCAL MEDICATIONS USED:  MARCAINE     SPECIMEN:  No Specimen  DISPOSITION OF SPECIMEN:  N/A  COUNTS:  YES  TOURNIQUET:  * Missing tourniquet times found for documented tourniquets in log: [id number] *  DICTATION: .Dragon Dictation  PLAN OF CARE: Discharge to home after PACU  PATIENT DISPOSITION:  PACU - hemodynamically stable.   Delay start of Pharmacological VTE agent (>24hrs) due to surgical blood loss or risk of bleeding: NO  [id number]

## 2020-08-25 NOTE — Transfer of Care (Signed)
Immediate Anesthesia Transfer of Care Note  Patient: Beth [name]  Procedure(s) Performed: KNEE ARTHROSCOPY WITH  MEDIAL MENISCAL REPAIR (Right Knee)  Patient Location: PACU  Anesthesia Type:General  Level of Consciousness: awake, alert , oriented and patient cooperative  Airway & Oxygen Therapy: Patient Spontanous Breathing  Post-op Assessment: Report given to RN, Post -op Vital signs reviewed and stable and Patient moving all extremities X 4  Post vital signs: Reviewed and stable  Last Vitals:  Vitals Value Taken Time  BP    Temp    Pulse    Resp    SpO2      Last Pain:  Vitals:   08/25/20 0647  TempSrc: Oral  PainSc: 0-No pain      Patients Stated Pain Goal: 5 (44/51/46 0479)  Complications: No complications documented.

## 2020-08-26 ENCOUNTER — Telehealth: Payer: Self-pay | Admitting: Orthopedic Surgery

## 2020-08-26 NOTE — Telephone Encounter (Signed)
She said she has hooked up the hose again but has not tried it yet, I told her to try it and see if it leaks,if it does call the phone number on the machine to see what they say. She voiced understanding.

## 2020-08-26 NOTE — Telephone Encounter (Signed)
Patient called, status/post knee arthroscopy 08/25/20,  to relay that "the hose on the right side of the cryo cuff machine has blown out"; said that water went everywhere. Please advise.

## 2020-08-27 ENCOUNTER — Telehealth: Payer: Self-pay | Admitting: Orthopedic Surgery

## 2020-08-27 ENCOUNTER — Encounter (HOSPITAL_COMMUNITY): Payer: Self-pay | Admitting: Orthopedic Surgery

## 2020-08-27 NOTE — Telephone Encounter (Signed)
I called her to advise / 
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
## 2020-08-27 NOTE — Telephone Encounter (Signed)
And the answer is .... Wait for it   NO! Has to wait till that Rx wears out   Take tylenol 500 MG q 6 hrs

## 2020-08-31 ENCOUNTER — Encounter: Payer: Self-pay | Admitting: Radiology

## 2020-09-01 DIAGNOSIS — Z9889 Other specified postprocedural states: Secondary | ICD-10-CM | POA: Insufficient documentation

## 2020-09-02 ENCOUNTER — Ambulatory Visit (INDEPENDENT_AMBULATORY_CARE_PROVIDER_SITE_OTHER): Payer: Medicaid Other | Admitting: Orthopedic Surgery

## 2020-09-02 ENCOUNTER — Other Ambulatory Visit: Payer: Self-pay

## 2020-09-02 ENCOUNTER — Encounter: Payer: Self-pay | Admitting: Orthopedic Surgery

## 2020-09-02 DIAGNOSIS — Z9889 Other specified postprocedural states: Secondary | ICD-10-CM

## 2020-09-02 MED ORDER — OXYCODONE HCL 5 MG PO TABS: 5.0000 mg | ORAL_TABLET | Freq: Three times a day (TID) | ORAL | 0 refills | Status: AC
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
## 2020-09-02 NOTE — Patient Instructions (Signed)
Take it easy for nest 4 weeks   Use the walker   Start the new exercises

## 2020-09-14 DIAGNOSIS — G8929 Other chronic pain: Secondary | ICD-10-CM | POA: Diagnosis not present

## 2020-09-14 DIAGNOSIS — R32 Unspecified urinary incontinence: Secondary | ICD-10-CM | POA: Diagnosis not present

## 2020-09-14 DIAGNOSIS — K219 Gastro-esophageal reflux disease without esophagitis: Secondary | ICD-10-CM | POA: Diagnosis not present

## 2020-09-14 DIAGNOSIS — M5459 Other low back pain: Secondary | ICD-10-CM | POA: Diagnosis not present
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
## 2020-09-30 ENCOUNTER — Other Ambulatory Visit: Payer: Self-pay

## 2020-09-30 ENCOUNTER — Encounter: Payer: Self-pay | Admitting: Orthopedic Surgery

## 2020-09-30 ENCOUNTER — Ambulatory Visit (INDEPENDENT_AMBULATORY_CARE_PROVIDER_SITE_OTHER): Payer: Medicaid Other | Admitting: Orthopedic Surgery

## 2020-09-30 ENCOUNTER — Ambulatory Visit: Payer: Medicaid Other

## 2020-09-30 DIAGNOSIS — G8929 Other chronic pain: Secondary | ICD-10-CM

## 2020-09-30 DIAGNOSIS — Z9889 Other specified postprocedural states: Secondary | ICD-10-CM

## 2020-09-30 DIAGNOSIS — M25561 Pain in right knee: Secondary | ICD-10-CM

## 2020-09-30 NOTE — Progress Notes (Signed)
Chief Complaint  Patient presents with  . Routine Post Op    08/25/20 right knee     S/p meniscal repair  6 weeks from a meniscal repair medial meniscus she is doing well she has a full extension no effusion knee flexion is 95 degrees actively passively I can get her up to 120 degrees  She will continue quadriceps exercises increase her knee flexion she can stop the protected weightbearing follow-up in 6 weeks Encounter Diagnoses  Name Primary?  . S/P right knee arthroscopy 08/25/20 meniscus repair  Yes  . Chronic pain of right knee

## 2020-09-30 NOTE — Patient Instructions (Signed)
Continue exercises   Ok to stop using the walker

## 2020-10-12 DIAGNOSIS — G8929 Other chronic pain: Secondary | ICD-10-CM | POA: Diagnosis not present

## 2020-10-12 DIAGNOSIS — M5459 Other low back pain: Secondary | ICD-10-CM | POA: Diagnosis not present

## 2020-10-12 DIAGNOSIS — I110 Hypertensive heart disease with heart failure: Secondary | ICD-10-CM | POA: Diagnosis not present

## 2020-10-15 DIAGNOSIS — R32 Unspecified urinary incontinence: Secondary | ICD-10-CM | POA: Diagnosis not present

## 2020-10-29 ENCOUNTER — Emergency Department (HOSPITAL_COMMUNITY): Payer: Medicaid Other

## 2020-10-29 ENCOUNTER — Encounter (HOSPITAL_COMMUNITY): Payer: Self-pay

## 2020-10-29 ENCOUNTER — Other Ambulatory Visit: Payer: Self-pay

## 2020-10-29 ENCOUNTER — Other Ambulatory Visit: Payer: Self-pay | Admitting: Obstetrics & Gynecology

## 2020-10-29 DIAGNOSIS — S8392XA Sprain of unspecified site of left knee, initial encounter: Secondary | ICD-10-CM | POA: Diagnosis not present

## 2020-10-29 DIAGNOSIS — S8992XA Unspecified injury of left lower leg, initial encounter: Secondary | ICD-10-CM | POA: Diagnosis present

## 2020-10-29 DIAGNOSIS — Z85118 Personal history of other malignant neoplasm of bronchus and lung: Secondary | ICD-10-CM | POA: Insufficient documentation

## 2020-10-29 DIAGNOSIS — Z79899 Other long term (current) drug therapy: Secondary | ICD-10-CM | POA: Insufficient documentation

## 2020-10-29 DIAGNOSIS — W52XXXA Crushed, pushed or stepped on by crowd or human stampede, initial encounter: Secondary | ICD-10-CM | POA: Diagnosis not present

## 2020-10-29 DIAGNOSIS — F17210 Nicotine dependence, cigarettes, uncomplicated: Secondary | ICD-10-CM | POA: Diagnosis not present

## 2020-10-29 DIAGNOSIS — Z96651 Presence of right artificial knee joint: Secondary | ICD-10-CM | POA: Insufficient documentation

## 2020-10-29 DIAGNOSIS — I10 Essential (primary) hypertension: Secondary | ICD-10-CM | POA: Diagnosis not present

## 2020-10-29 DIAGNOSIS — N719 Inflammatory disease of uterus, unspecified: Secondary | ICD-10-CM

## 2020-10-29 DIAGNOSIS — M1712 Unilateral primary osteoarthritis, left knee: Secondary | ICD-10-CM | POA: Diagnosis not present

## 2020-10-29 DIAGNOSIS — J449 Chronic obstructive pulmonary disease, unspecified: Secondary | ICD-10-CM | POA: Insufficient documentation
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
## 2020-10-30 ENCOUNTER — Emergency Department (HOSPITAL_COMMUNITY)
Admission: EM | Admit: 2020-10-30 | Discharge: 2020-10-30 | Disposition: A | Discharge status: Home / Self Care | Payer: Medicaid Other | Attending: Emergency Medicine | Admitting: Emergency Medicine

## 2020-10-30 DIAGNOSIS — S8392XA Sprain of unspecified site of left knee, initial encounter: Secondary | ICD-10-CM

## 2020-10-30 MED ORDER — IBUPROFEN 400 MG PO TABS
400.0000 mg | ORAL_TABLET | Freq: Once | ORAL | Status: AC
  Administered 2020-10-30: 400 mg via ORAL
  Filled 2020-10-30: qty 1
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
## 2020-11-02 ENCOUNTER — Other Ambulatory Visit: Payer: Self-pay

## 2020-11-02 ENCOUNTER — Telehealth: Payer: Self-pay

## 2020-11-02 ENCOUNTER — Ambulatory Visit (INDEPENDENT_AMBULATORY_CARE_PROVIDER_SITE_OTHER): Payer: Medicaid Other

## 2020-11-02 DIAGNOSIS — N719 Inflammatory disease of uterus, unspecified: Secondary | ICD-10-CM | POA: Diagnosis not present

## 2020-11-02 NOTE — Progress Notes (Signed)
PELVIC US TA/TV: atropic homogenous anteflexed uterus,WNL,thin endometrium with a trace of simple fluid within the endometrium,EEC 1 mm,normal ovaries,ovaries appear mobile,no free fluid,no pain during ultrasound  Chaperone Daisy

## 2020-11-02 NOTE — Telephone Encounter (Signed)
Transition Care Management Unsuccessful Follow-up Telephone Call  Date of discharge and from where:  10/30/2020 [facility] ED  Attempts:  1st Attempt  Reason for unsuccessful TCM follow-up call:  No answer/busy

## 2020-11-03 NOTE — Telephone Encounter (Signed)
Transition Care Management Follow-up Telephone Call  Date of discharge and from where: 10/30/2020 [facility] ED  How have you been since you were released from the hospital? Pt states that she is feeling better.   Any questions or concerns? No  Items Reviewed:  Did the pt receive and understand the discharge instructions provided? Yes   Medications obtained and verified? Yes   Other? No   Any new allergies since your discharge? No   Dietary orders reviewed? N/A  Do you have support at home? Yes   Functional Questionnaire: (I = Independent and D = Dependent) ADLs: I  Bathing/Dressing- I  Meal Prep- I  Eating- I  Maintaining continence- I  Transferring/Ambulation- I  Managing Meds- I   Follow up appointments reviewed:   PCP Hospital f/u appt confirmed? Yes  Scheduled to see Dr. Cindie Laroche on 11/09/2020.  Are transportation arrangements needed? No   If their condition worsens, is the pt aware to call PCP or go to the Emergency Dept.? Yes  Was the patient provided with contact information for the PCP's office or ED? Yes  Was to pt encouraged to call back with questions or concerns? Yes

## 2020-11-06 DIAGNOSIS — R32 Unspecified urinary incontinence: Secondary | ICD-10-CM | POA: Diagnosis not present

## 2020-11-11 ENCOUNTER — Other Ambulatory Visit: Payer: Self-pay

## 2020-11-11 ENCOUNTER — Ambulatory Visit (INDEPENDENT_AMBULATORY_CARE_PROVIDER_SITE_OTHER): Payer: Medicaid Other | Admitting: Orthopedic Surgery

## 2020-11-11 DIAGNOSIS — Z9889 Other specified postprocedural states: Secondary | ICD-10-CM

## 2020-11-11 NOTE — Patient Instructions (Signed)
Just work on your exercises   See [place] as needed

## 2020-11-11 NOTE — Progress Notes (Addendum)
Chief Complaint  Patient presents with  . Knee Pain    R/ DOS 08/25/20/doing good the cold is making it hurt. Still feels like it wants to give out.    Encounter Diagnosis  Name Primary?  . S/P right knee arthroscopy 08/25/20 meniscus repair  Yes   59 year old female status post meniscal repair doing well has some weakness in her quads some pain with cold weather but has recovered nicely  Today we find her she is walking without support no limp full range of motion of the knee no tenderness no swelling she has some quadriceps weakness  We are releasing her today she should continue to work on her quadriceps exercises follow-up as needed

## 2020-11-16 ENCOUNTER — Other Ambulatory Visit (HOSPITAL_COMMUNITY): Payer: Self-pay | Admitting: Family Medicine

## 2020-11-16 DIAGNOSIS — Z1231 Encounter for screening mammogram for malignant neoplasm of breast: Secondary | ICD-10-CM

## 2020-11-19 ENCOUNTER — Ambulatory Visit (HOSPITAL_COMMUNITY)
Admission: RE | Admit: 2020-11-19 | Discharge: 2020-11-19 | Disposition: A | Discharge status: Home / Self Care | Payer: Medicaid Other | Source: Ambulatory Visit | Attending: Family Medicine | Admitting: Family Medicine

## 2020-11-19 ENCOUNTER — Other Ambulatory Visit: Payer: Self-pay

## 2020-11-19 DIAGNOSIS — Z1231 Encounter for screening mammogram for malignant neoplasm of breast: Secondary | ICD-10-CM | POA: Diagnosis not present

## 2020-12-07 DIAGNOSIS — R32 Unspecified urinary incontinence: Secondary | ICD-10-CM | POA: Diagnosis not present

## 2021-01-06 DIAGNOSIS — R32 Unspecified urinary incontinence: Secondary | ICD-10-CM | POA: Diagnosis not present

## 2021-01-08 ENCOUNTER — Other Ambulatory Visit (HOSPITAL_COMMUNITY): Payer: Self-pay | Admitting: Family Medicine

## 2021-01-08 DIAGNOSIS — R059 Cough, unspecified: Secondary | ICD-10-CM

## 2021-01-11 ENCOUNTER — Ambulatory Visit (HOSPITAL_COMMUNITY)
Admission: RE | Admit: 2021-01-11 | Discharge: 2021-01-11 | Disposition: A | Discharge status: Home / Self Care | Payer: Medicaid Other | Source: Ambulatory Visit | Attending: Family Medicine | Admitting: Family Medicine

## 2021-01-11 ENCOUNTER — Other Ambulatory Visit: Payer: Self-pay

## 2021-01-11 DIAGNOSIS — R059 Cough, unspecified: Secondary | ICD-10-CM | POA: Diagnosis not present

## 2021-01-11 DIAGNOSIS — J90 Pleural effusion, not elsewhere classified: Secondary | ICD-10-CM | POA: Diagnosis not present

## 2021-02-12 ENCOUNTER — Other Ambulatory Visit: Payer: Self-pay

## 2021-02-12 ENCOUNTER — Inpatient Hospital Stay (HOSPITAL_COMMUNITY)
Admission: EM | Admit: 2021-02-12 | Discharge: 2021-02-16 | DRG: 481 | Disposition: A | Discharge status: Home Health | Payer: Medicaid Other | Attending: Internal Medicine | Admitting: Internal Medicine

## 2021-02-12 ENCOUNTER — Encounter (HOSPITAL_COMMUNITY): Payer: Self-pay | Admitting: Emergency Medicine

## 2021-02-12 ENCOUNTER — Emergency Department (HOSPITAL_COMMUNITY): Payer: Medicaid Other

## 2021-02-12 DIAGNOSIS — D72829 Elevated white blood cell count, unspecified: Secondary | ICD-10-CM | POA: Diagnosis present

## 2021-02-12 DIAGNOSIS — I10 Essential (primary) hypertension: Secondary | ICD-10-CM | POA: Diagnosis present

## 2021-02-12 DIAGNOSIS — Z885 Allergy status to narcotic agent status: Secondary | ICD-10-CM | POA: Diagnosis not present

## 2021-02-12 DIAGNOSIS — Y93K1 Activity, walking an animal: Secondary | ICD-10-CM

## 2021-02-12 DIAGNOSIS — Z808 Family history of malignant neoplasm of other organs or systems: Secondary | ICD-10-CM | POA: Diagnosis not present

## 2021-02-12 DIAGNOSIS — R636 Underweight: Secondary | ICD-10-CM | POA: Diagnosis present

## 2021-02-12 DIAGNOSIS — Z8673 Personal history of transient ischemic attack (TIA), and cerebral infarction without residual deficits: Secondary | ICD-10-CM

## 2021-02-12 DIAGNOSIS — J449 Chronic obstructive pulmonary disease, unspecified: Secondary | ICD-10-CM | POA: Diagnosis present

## 2021-02-12 DIAGNOSIS — I4510 Unspecified right bundle-branch block: Secondary | ICD-10-CM | POA: Diagnosis present

## 2021-02-12 DIAGNOSIS — Z880 Allergy status to penicillin: Secondary | ICD-10-CM | POA: Diagnosis not present

## 2021-02-12 DIAGNOSIS — K5900 Constipation, unspecified: Secondary | ICD-10-CM | POA: Diagnosis present

## 2021-02-12 DIAGNOSIS — Z720 Tobacco use: Secondary | ICD-10-CM | POA: Diagnosis present

## 2021-02-12 DIAGNOSIS — Z419 Encounter for procedure for purposes other than remedying health state, unspecified: Secondary | ICD-10-CM

## 2021-02-12 DIAGNOSIS — Z681 Body mass index (BMI) 19 or less, adult: Secondary | ICD-10-CM

## 2021-02-12 DIAGNOSIS — Z8541 Personal history of malignant neoplasm of cervix uteri: Secondary | ICD-10-CM

## 2021-02-12 DIAGNOSIS — F10120 Alcohol abuse with intoxication, uncomplicated: Secondary | ICD-10-CM | POA: Diagnosis present

## 2021-02-12 DIAGNOSIS — R64 Cachexia: Secondary | ICD-10-CM | POA: Diagnosis present

## 2021-02-12 DIAGNOSIS — Z85118 Personal history of other malignant neoplasm of bronchus and lung: Secondary | ICD-10-CM | POA: Diagnosis not present

## 2021-02-12 DIAGNOSIS — Z79899 Other long term (current) drug therapy: Secondary | ICD-10-CM

## 2021-02-12 DIAGNOSIS — F10920 Alcohol use, unspecified with intoxication, uncomplicated: Secondary | ICD-10-CM

## 2021-02-12 DIAGNOSIS — S8261XA Displaced fracture of lateral malleolus of right fibula, initial encounter for closed fracture: Secondary | ICD-10-CM | POA: Diagnosis present

## 2021-02-12 DIAGNOSIS — Z801 Family history of malignant neoplasm of trachea, bronchus and lung: Secondary | ICD-10-CM | POA: Diagnosis not present

## 2021-02-12 DIAGNOSIS — F17210 Nicotine dependence, cigarettes, uncomplicated: Secondary | ICD-10-CM | POA: Diagnosis present

## 2021-02-12 DIAGNOSIS — Z791 Long term (current) use of non-steroidal anti-inflammatories (NSAID): Secondary | ICD-10-CM

## 2021-02-12 DIAGNOSIS — F1010 Alcohol abuse, uncomplicated: Secondary | ICD-10-CM | POA: Diagnosis present

## 2021-02-12 DIAGNOSIS — S72331A Displaced oblique fracture of shaft of right femur, initial encounter for closed fracture: Secondary | ICD-10-CM | POA: Diagnosis not present

## 2021-02-12 DIAGNOSIS — W010XXA Fall on same level from slipping, tripping and stumbling without subsequent striking against object, initial encounter: Secondary | ICD-10-CM | POA: Diagnosis present

## 2021-02-12 DIAGNOSIS — Z01810 Encounter for preprocedural cardiovascular examination: Secondary | ICD-10-CM

## 2021-02-12 DIAGNOSIS — S72461A Displaced supracondylar fracture with intracondylar extension of lower end of right femur, initial encounter for closed fracture: Principal | ICD-10-CM | POA: Diagnosis present

## 2021-02-12 DIAGNOSIS — S72401A Unspecified fracture of lower end of right femur, initial encounter for closed fracture: Secondary | ICD-10-CM

## 2021-02-12 DIAGNOSIS — Z20822 Contact with and (suspected) exposure to covid-19: Secondary | ICD-10-CM | POA: Diagnosis present

## 2021-02-12 HISTORY — DX: Tobacco use: Z72.0

## 2021-02-12 LAB — CBC WITH DIFFERENTIAL/PLATELET
Abs Immature Granulocytes: 0.05 10*3/uL (ref 0.00–0.07)
Basophils Absolute: 0.1 10*3/uL (ref 0.0–0.1)
Basophils Relative: 1 %
Eosinophils Absolute: 0.2 10*3/uL (ref 0.0–0.5)
Eosinophils Relative: 2 %
HCT: 38.4 % (ref 36.0–46.0)
Hemoglobin: 12.4 g/dL (ref 12.0–15.0)
Immature Granulocytes: 0 %
Lymphocytes Relative: 31 %
Lymphs Abs: 3.9 10*3/uL (ref 0.7–4.0)
MCH: 31.4 pg (ref 26.0–34.0)
MCHC: 32.3 g/dL (ref 30.0–36.0)
MCV: 97.2 fL (ref 80.0–100.0)
Monocytes Absolute: 1.2 10*3/uL — ABNORMAL HIGH (ref 0.1–1.0)
Monocytes Relative: 9 %
Neutro Abs: 7.4 10*3/uL (ref 1.7–7.7)
Neutrophils Relative %: 57 %
Platelets: 361 10*3/uL (ref 150–400)
RBC: 3.95 MIL/uL (ref 3.87–5.11)
RDW: 13.1 % (ref 11.5–15.5)
WBC: 12.8 10*3/uL — ABNORMAL HIGH (ref 4.0–10.5)
nRBC: 0 % (ref 0.0–0.2)

## 2021-02-12 NOTE — ED Notes (Signed)
Pt to Xray.

## 2021-02-12 NOTE — ED Notes (Signed)
Pt to CT

## 2021-02-12 NOTE — Progress Notes (Signed)
Reviewed case with [facility] emergency room provider, Herb Grays, PA-C.  Patient with a right distal fibula fracture and right distal femur fracture.  Multiple medical comorbidities including alcoholism, tobacco abuse, lung cancer, history of intracranial bleed, among others.  Plan for transfer to [facility], admission to the medical service, preoperative medical optimization, orthopedic consultation to follow in the morning, likely surgical intervention for the femur on Sunday  Dan Caffrey and Su Grand will be on-call tomorrow from my group, and Dr. Edmonia Lynch is likely to operate on Sunday.  Posterior splint for the right ankle, knee immobilizer for the right knee, CAT scan of the right knee to evaluate the intra-articular component.  Johnny Bridge, MD

## 2021-02-12 NOTE — ED Triage Notes (Signed)
Pt states she was walking her two dogs when she tripped and fell. Pt c.o right ankle pain. Pt states she took 2 oxycodone 5mg  for the pain PTA
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
## 2021-02-12 NOTE — ED Provider Notes (Signed)
This patient is a 60 year old female presenting to the hospital with injury to her right leg after she had a fall while walking her 3 poodles.  She reports that she was unable to get up off the ground but her husband helped her, she was unable to bear weight on the right leg because of pain in both the knee and the ankle.  On my exam the patient does not fact have a joint effusion of the right knee with tenderness with any manipulation of the knee or the ankle.  There is tenderness laterally over the right ankle.  No other traumatic injury seen on the body.  She does not have tachycardia, she has good pulses, good sensation distally on the right leg, mental status is clear, she does endorse having alcohol this evening.  I have viewed her imaging, she has a distal femur fracture as well as her right lateral ankle fracture, these do appear stable, discussed with orthopedics by physician assistant, Dr. Mardelle Matte will acceptthe patient for surgery, hospitalist to admit  Medical screening examination/treatment/procedure(s) were conducted as a shared visit with non-physician practitioner(s) and myself.  I personally evaluated the patient during the encounter.  Clinical Impression:   Final diagnoses:  Closed fracture of distal end of right femur, unspecified fracture morphology, initial encounter ([place])  Closed displaced fracture of lateral malleolus of right fibula, initial encounter  Alcoholic intoxication without complication ([facility])         Noemi Chapel, MD 02/14/21 1003

## 2021-02-13 ENCOUNTER — Inpatient Hospital Stay (HOSPITAL_COMMUNITY): Payer: Medicaid Other

## 2021-02-13 ENCOUNTER — Encounter (HOSPITAL_COMMUNITY): Payer: Self-pay | Admitting: Internal Medicine

## 2021-02-13 DIAGNOSIS — S72331A Displaced oblique fracture of shaft of right femur, initial encounter for closed fracture: Secondary | ICD-10-CM

## 2021-02-13 DIAGNOSIS — D72829 Elevated white blood cell count, unspecified: Secondary | ICD-10-CM | POA: Diagnosis present

## 2021-02-13 DIAGNOSIS — Z720 Tobacco use: Secondary | ICD-10-CM | POA: Diagnosis present

## 2021-02-13 DIAGNOSIS — I10 Essential (primary) hypertension: Secondary | ICD-10-CM | POA: Diagnosis present

## 2021-02-13 DIAGNOSIS — S8261XA Displaced fracture of lateral malleolus of right fibula, initial encounter for closed fracture: Secondary | ICD-10-CM | POA: Diagnosis present

## 2021-02-13 DIAGNOSIS — J449 Chronic obstructive pulmonary disease, unspecified: Secondary | ICD-10-CM | POA: Diagnosis present

## 2021-02-13 HISTORY — DX: Displaced fracture of lateral malleolus of right fibula, initial encounter for closed fracture: S82.61XA

## 2021-02-13 LAB — URINALYSIS, ROUTINE W REFLEX MICROSCOPIC
Bilirubin Urine: NEGATIVE
Glucose, UA: NEGATIVE mg/dL
Hgb urine dipstick: NEGATIVE
Ketones, ur: NEGATIVE mg/dL
Leukocytes,Ua: NEGATIVE
Nitrite: NEGATIVE
Protein, ur: NEGATIVE mg/dL
Specific Gravity, Urine: 1.005 (ref 1.005–1.030)
pH: 7 (ref 5.0–8.0)

## 2021-02-13 LAB — HIV ANTIBODY (ROUTINE TESTING W REFLEX): HIV Screen 4th Generation wRfx: NONREACTIVE

## 2021-02-13 LAB — BASIC METABOLIC PANEL
Anion gap: 11 (ref 5–15)
BUN: 6 mg/dL (ref 6–20)
CO2: 20 mmol/L — ABNORMAL LOW (ref 22–32)
Calcium: 9.1 mg/dL (ref 8.9–10.3)
Chloride: 104 mmol/L (ref 98–111)
Creatinine, Ser: 0.5 mg/dL (ref 0.44–1.00)
GFR, Estimated: >60 mL/min (ref >60)
Glucose, Bld: 90 mg/dL (ref 70–99)
Potassium: 3.6 mmol/L (ref 3.5–5.1)
Sodium: 135 mmol/L (ref 135–145)

## 2021-02-13 LAB — MAGNESIUM: Magnesium: 1.5 mg/dL — ABNORMAL LOW (ref 1.7–2.4)

## 2021-02-13 LAB — PHOSPHORUS: Phosphorus: 4.3 mg/dL (ref 2.5–4.6)

## 2021-02-13 LAB — HEMOGLOBIN AND HEMATOCRIT, BLOOD
HCT: 35.6 % — ABNORMAL LOW (ref 36.0–46.0)
Hemoglobin: 12.1 g/dL (ref 12.0–15.0)

## 2021-02-13 LAB — RESP PANEL BY RT-PCR (FLU A&B, COVID) ARPGX2
Influenza A by PCR: NEGATIVE
Influenza B by PCR: NEGATIVE
SARS Coronavirus 2 by RT PCR: NEGATIVE

## 2021-02-13 MED ORDER — NICOTINE 14 MG/24HR TD PT24
14.0000 mg | MEDICATED_PATCH | Freq: Every day | TRANSDERMAL | Status: DC
  Administered 2021-02-13 – 2021-02-16 (×3): 14 mg via TRANSDERMAL
  Filled 2021-02-13 (×4): qty 1

## 2021-02-13 MED ORDER — MAGNESIUM OXIDE 400 (241.3 MG) MG PO TABS
400.0000 mg | ORAL_TABLET | Freq: Two times a day (BID) | ORAL | Status: DC
  Administered 2021-02-13: 400 mg via ORAL
  Filled 2021-02-13: qty 1

## 2021-02-13 MED ORDER — LORAZEPAM 2 MG/ML IJ SOLN: 1.0000 mg | INTRAMUSCULAR | Status: AC | PRN

## 2021-02-13 MED ORDER — THIAMINE HCL 100 MG PO TABS
100.0000 mg | ORAL_TABLET | Freq: Every day | ORAL | Status: DC
  Administered 2021-02-13 – 2021-02-16 (×3): 100 mg via ORAL
  Filled 2021-02-13 (×3): qty 1

## 2021-02-13 MED ORDER — DOCUSATE SODIUM 100 MG PO CAPS
100.0000 mg | ORAL_CAPSULE | Freq: Two times a day (BID) | ORAL | Status: DC
  Administered 2021-02-13 (×2): 100 mg via ORAL
  Filled 2021-02-13 (×2): qty 1

## 2021-02-13 MED ORDER — MORPHINE SULFATE (PF) 2 MG/ML IV SOLN
2.0000 mg | INTRAVENOUS | Status: DC | PRN
Start: 2021-02-13 — End: 2021-02-16
  Administered 2021-02-14: 2 mg via INTRAVENOUS
  Filled 2021-02-13: qty 1

## 2021-02-13 MED ORDER — ENOXAPARIN SODIUM 30 MG/0.3ML ~~LOC~~ SOLN
30.0000 mg | SUBCUTANEOUS | Status: DC
  Administered 2021-02-13: 30 mg via SUBCUTANEOUS
  Filled 2021-02-13: qty 0.3

## 2021-02-13 MED ORDER — ADULT MULTIVITAMIN W/MINERALS CH
1.0000 | ORAL_TABLET | Freq: Every day | ORAL | Status: DC
  Administered 2021-02-13 – 2021-02-16 (×3): 1 via ORAL
  Filled 2021-02-13 (×3): qty 1

## 2021-02-13 MED ORDER — KETOROLAC TROMETHAMINE 15 MG/ML IJ SOLN
7.5000 mg | Freq: Three times a day (TID) | INTRAMUSCULAR | Status: AC | PRN
  Administered 2021-02-13: 7.5 mg via INTRAVENOUS
  Filled 2021-02-13: qty 1

## 2021-02-13 MED ORDER — LORAZEPAM 2 MG/ML IJ SOLN: 0.0000 mg | Freq: Four times a day (QID) | INTRAMUSCULAR | Status: AC

## 2021-02-13 MED ORDER — OXYCODONE HCL 5 MG PO TABS
5.0000 mg | ORAL_TABLET | ORAL | Status: DC | PRN
  Administered 2021-02-13 – 2021-02-15 (×4): 5 mg via ORAL
  Filled 2021-02-13 (×4): qty 1

## 2021-02-13 MED ORDER — FOLIC ACID 1 MG PO TABS
1.0000 mg | ORAL_TABLET | Freq: Every day | ORAL | Status: DC
  Administered 2021-02-13 – 2021-02-16 (×3): 1 mg via ORAL
  Filled 2021-02-13 (×4): qty 1

## 2021-02-13 MED ORDER — LORAZEPAM 2 MG/ML IJ SOLN: 0.0000 mg | Freq: Two times a day (BID) | INTRAMUSCULAR | Status: DC

## 2021-02-13 MED ORDER — LORAZEPAM 1 MG PO TABS
1.0000 mg | ORAL_TABLET | ORAL | Status: AC | PRN
  Administered 2021-02-13: 1 mg via ORAL
  Filled 2021-02-13: qty 1

## 2021-02-13 MED ORDER — MAGNESIUM SULFATE 2 GM/50ML IV SOLN
2.0000 g | Freq: Once | INTRAVENOUS | Status: AC
  Administered 2021-02-13: 2 g via INTRAVENOUS
  Filled 2021-02-13: qty 50

## 2021-02-13 MED ORDER — THIAMINE HCL 100 MG/ML IJ SOLN
100.0000 mg | Freq: Every day | INTRAMUSCULAR | Status: DC
  Filled 2021-02-13: qty 2

## 2021-02-13 MED ORDER — KETOROLAC TROMETHAMINE 15 MG/ML IJ SOLN
15.0000 mg | Freq: Once | INTRAMUSCULAR | Status: AC
  Administered 2021-02-13: 15 mg via INTRAVENOUS
  Filled 2021-02-13: qty 1

## 2021-02-13 NOTE — Progress Notes (Signed)
Patient is seen and examined, vital signs are stable  she is admitted early this morning, detail please see HPI. She is to have surgery tomorrow, n.p.o. after midnight

## 2021-02-13 NOTE — ED Notes (Signed)
Report given to [facility] 

## 2021-02-13 NOTE — H&P (View-Only) (Signed)
Reason for Consult:right femur and right fibula fracture Referring Physician: Dr Beth [name] is an 60 y.o. female.  HPI: Beth [name] is a 60 y.o. female with medical history significant of alcohol abuse, intracranial hemorrhage, cervical cancer, COPD, hypertension, unspecified lung infection, history of lung nodule, tobacco abuse who is coming to the emergency department after having a fall while walking her 2 dogs landing on her right side, injuring her right knee and right ankle.  She states this was an accident as she lost her balance while the dogs were pushing from the leashes.  She denies chest pain, palpitations, dizziness, diaphoresis, nausea or emesis.  Denies fever, chills, rhinorrhea, sore throat, abdominal pain, diarrhea, constipation, melena or hematochezia.  No dysuria, frequency or materia.  No polyuria, polydipsia, polyphagia or blurred vision.  Past Medical History:  Diagnosis Date  . Alcohol abuse 10/26/2011  . Brain bleed ([place]) 2012  . Cervical cancer ([place])   . COPD (chronic obstructive pulmonary disease) ([place])   . Displaced fracture of lateral malleolus of right fibula, initial encounter for closed fracture 02/13/2021  . Hypertension   . Lung infection   . Tobacco abuse     Past Surgical History:  Procedure Laterality Date  . BIOPSY  02/04/2020   Procedure: BIOPSY;  Surgeon: Danie Binder, MD;  Location: [facility];  Service: Endoscopy;;  . BRONCHOSCOPY    . COLONOSCOPY WITH PROPOFOL N/A 02/04/2020   Procedure: COLONOSCOPY WITH PROPOFOL;  Surgeon: Danie Binder, MD;  Location[facility];  Service: Endoscopy;  Laterality: N/A;  8:45am  . KNEE ARTHROSCOPY WITH MENISCAL REPAIR Right 08/25/2020   Procedure: KNEE ARTHROSCOPY WITH  MEDIAL MENISCAL REPAIR;  Surgeon: Carole Civil, MD;  Location: [facility];  Service: Orthopedics;  Laterality: Right;  . MULTIPLE TOOTH EXTRACTIONS    . POLYPECTOMY  02/04/2020   Procedure: POLYPECTOMY;  Surgeon: Danie Binder,  MD;  Location: [facility];  Service: Endoscopy;;  . rod in arm Left     Family History  Problem Relation Age of Onset  . Throat cancer Mother   . Lung cancer Father   . Throat cancer Brother   . Lung cancer Brother   . Colon cancer Neg Hx     Social History:  reports that she has been smoking cigarettes. She has a 8.75 pack-year smoking history. She has never used smokeless tobacco. She reports previous alcohol use. She reports that she does not use drugs.  Allergies:  Allergies  Allergen Reactions  . Amoxicillin Nausea Only  . Codeine Itching    Medications: I have reviewed the patient's current medications.  Results for orders placed or performed during the hospital encounter of 02/12/21 (from the past 48 hour(s))  Resp Panel by RT-PCR (Flu A&B, Covid) Nasopharyngeal Swab     Status: None   Collection Time: 02/12/21 11:10 PM   Specimen: Nasopharyngeal Swab; Nasopharyngeal(NP) swabs in vial transport medium  Result Value Ref Range   SARS Coronavirus 2 by RT PCR NEGATIVE NEGATIVE    Comment: (NOTE) SARS-CoV-2 target nucleic acids are NOT DETECTED.  The SARS-CoV-2 RNA is generally detectable in upper respiratory specimens during the acute phase of infection. The lowest concentration of SARS-CoV-2 viral copies this assay can detect is 138 copies/mL. A negative result does not preclude SARS-Cov-2 infection and should not be used as the sole basis for treatment or other patient management decisions. A negative result may occur with  improper specimen collection/handling, submission of  specimen other than nasopharyngeal swab, presence of viral mutation(s) within the areas targeted by this assay, and inadequate number of viral copies(<138 copies/mL). A negative result must be combined with clinical observations, patient history, and epidemiological information. The expected result is Negative.  Fact Sheet for Patients:  EntrepreneurPulse.com.au  Fact  Sheet for Healthcare Providers:  IncredibleEmployment.be  This test is no t yet approved or cleared by the [place] FDA and  has been authorized for detection and/or diagnosis of SARS-CoV-2 by FDA under an Emergency Use Authorization (EUA). This EUA will remain  in effect (meaning this test can be used) for the duration of the COVID-19 declaration under Section 564(b)(1) of the Act, 21 U.S.C.section 360bbb-3(b)(1), unless the authorization is terminated  or revoked sooner.       Influenza A by PCR NEGATIVE NEGATIVE   Influenza B by PCR NEGATIVE NEGATIVE    Comment: (NOTE) The Xpert Xpress SARS-CoV-2/FLU/RSV plus assay is intended as an aid in the diagnosis of influenza from Nasopharyngeal swab specimens and should not be used as a sole basis for treatment. Nasal washings and aspirates are unacceptable for Xpert Xpress SARS-CoV-2/FLU/RSV testing.  Fact Sheet for Patients: EntrepreneurPulse.com.au  Fact Sheet for Healthcare Providers: IncredibleEmployment.be  This test is not yet approved or cleared by the [place] FDA and has been authorized for detection and/or diagnosis of SARS-CoV-2 by FDA under an Emergency Use Authorization (EUA). This EUA will remain in effect (meaning this test can be used) for the duration of the COVID-19 declaration under Section 564(b)(1) of the Act, 21 U.S.C. section 360bbb-3(b)(1), unless the authorization is terminated or revoked.  Performed at [facility], [address], [place], [place] [address]   CBC with Differential     Status: Abnormal   Collection Time: 02/12/21 11:27 PM  Result Value Ref Range   WBC 12.8 (H) 4.0 - 10.5 K/uL   RBC 3.95 3.87 - 5.11 MIL/uL   Hemoglobin 12.4 12.0 - 15.0 g/dL   HCT 38.4 36.0 - 46.0 %   MCV 97.2 80.0 - 100.0 fL   MCH 31.4 26.0 - 34.0 pg   MCHC 32.3 30.0 - 36.0 g/dL   RDW 13.1 11.5 - 15.5 %   Platelets 361 150 - 400 K/uL   nRBC 0.0 0.0 -  0.2 %   Neutrophils Relative % 57 %   Neutro Abs 7.4 1.7 - 7.7 K/uL   Lymphocytes Relative 31 %   Lymphs Abs 3.9 0.7 - 4.0 K/uL   Monocytes Relative 9 %   Monocytes Absolute 1.2 (H) 0.1 - 1.0 K/uL   Eosinophils Relative 2 %   Eosinophils Absolute 0.2 0.0 - 0.5 K/uL   Basophils Relative 1 %   Basophils Absolute 0.1 0.0 - 0.1 K/uL   Immature Granulocytes 0 %   Abs Immature Granulocytes 0.05 0.00 - 0.07 K/uL    Comment: Performed at [facility], [address], [place], [place] [address]  Basic metabolic panel     Status: Abnormal   Collection Time: 02/12/21 11:27 PM  Result Value Ref Range   Sodium 135 135 - 145 mmol/L   Potassium 3.6 3.5 - 5.1 mmol/L   Chloride 104 98 - 111 mmol/L   CO2 20 (L) 22 - 32 mmol/L   Glucose, Bld 90 70 - 99 mg/dL    Comment: Glucose reference range applies only to samples taken after fasting for at least 8 hours.   BUN 6 6 - 20 mg/dL   Creatinine, Ser 0.50 0.44 -  1.00 mg/dL   Calcium 9.1 8.9 - 10.3 mg/dL   GFR, Estimated >60 >60 mL/min    Comment: (NOTE) Calculated using the CKD-EPI Creatinine Equation (2021)    Anion gap 11 5 - 15    Comment: Performed at [facility], [address] [place], [place] [address]  Magnesium     Status: Abnormal   Collection Time: 02/12/21 11:27 PM  Result Value Ref Range   Magnesium 1.5 (L) 1.7 - 2.4 mg/dL    Comment: Performed at [facility], [address], [place], [place] [address]  Phosphorus     Status: None   Collection Time: 02/12/21 11:27 PM  Result Value Ref Range   Phosphorus 4.3 2.5 - 4.6 mg/dL    Comment: Performed at [facility], [address] [place], [place] [address]  Hemoglobin and hematocrit, blood     Status: Abnormal   Collection Time: 02/13/21  7:08 AM  Result Value Ref Range   Hemoglobin 12.1 12.0 - 15.0 g/dL   HCT 35.6 (L) 36.0 - 46.0 %    Comment: Performed at [facility] [address] [place], [place] [address]    DG Ankle Complete Right  Result Date: 02/12/2021 CLINICAL  DATA:  Fall EXAM: RIGHT ANKLE - COMPLETE 3+ VIEW COMPARISON:  None. FINDINGS: Nondisplaced distal fibular fracture noted at the level of the ankle mortise. No tibial abnormality. Joint space maintained. Lateral soft tissue swelling. IMPRESSION: Nondisplaced distal fibular fracture. Electronically Signed   By: Rolm Baptise M.D.   On: 02/12/2021 22:28   CT Head Wo Contrast  Result Date: 02/12/2021 CLINICAL DATA:  Fall EXAM: CT HEAD WITHOUT CONTRAST CT CERVICAL SPINE WITHOUT CONTRAST TECHNIQUE: Multidetector CT imaging of the head and cervical spine was performed following the standard protocol without intravenous contrast. Multiplanar CT image reconstructions of the cervical spine were also generated. COMPARISON:  CT head and cervical spine 10/09/2019 (report only, images unavailable) CT head and cervical spine 07/06/2019 FINDINGS: CT HEAD FINDINGS Brain: No evidence of acute infarction, hemorrhage, hydrocephalus, extra-axial collection, visible mass lesion or mass effect. Vascular: Atherosclerotic calcification of the carotid siphons. No hyperdense vessel. Skull: Chronic right supraorbital scarring. No acute soft tissue swelling, gas or laceration. No large scalp hematoma. No calvarial fracture or acute osseous injury within the included levels of imaging. Sinuses/Orbits: Chronic rightward anterior nasal septal deviation and in contacting left-sided nasal septal spur. Paranasal sinuses and mastoid air cells are predominantly clear. Middle ear cavities are clear. Included orbital structures are unremarkable. Other: None CT CERVICAL SPINE FINDINGS Alignment: Slight exaggeration of the upper cervical lordosis. Unchanged likely degenerative stepwise anterolisthesis C4-C7. Grossly unchanged alignment of a nonunited type 2 dens fracture and the asymmetric positioning of the lateral masses C1-C2. No acute traumatic listhesis is seen. Skull base and vertebrae: Redemonstration of the chronic, well corticated type 2 dens  fracture with posterior translation and angulation. No acute cervical spine fracture is seen. No visible skull base fractures or other acute osseous abnormalities. No suspicious osseous lesions. Cervical spondylitic changes as below. Additional arthrosis about the atlantodental interval and basion dens intervals, unchanged from prior. Soft tissues and spinal canal: No pre or paravertebral fluid or swelling. No visible canal hematoma. Disc levels: Multilevel intervertebral disc height loss with spondylitic endplate changes. At most mild resulting canal stenosis C4-5, C5-6. Multilevel uncinate spurring and facet hypertrophic changes are present as well maximal C4-C7 where there is mild-to-moderate foraminal narrowing bilaterally. Upper chest: No acute abnormality in the upper chest or  imaged lung apices. Stable emphysematous changes. Other: No concerning thyroid nodule or mass. IMPRESSION: 1. No acute intracranial abnormality. 2. Grossly unchanged alignment of the chronic, well corticated type 2 dens fracture with posterior translation and angulation. 3. No acute cervical spine fracture or traumatic listhesis. 4. Multilevel degenerative changes of the cervical spine as described above. Electronically Signed   By: Lovena Le M.D.   On: 02/12/2021 23:20   CT Cervical Spine Wo Contrast  Result Date: 02/12/2021 CLINICAL DATA:  Fall EXAM: CT HEAD WITHOUT CONTRAST CT CERVICAL SPINE WITHOUT CONTRAST TECHNIQUE: Multidetector CT imaging of the head and cervical spine was performed following the standard protocol without intravenous contrast. Multiplanar CT image reconstructions of the cervical spine were also generated. COMPARISON:  CT head and cervical spine 10/09/2019 (report only, images unavailable) CT head and cervical spine 07/06/2019 FINDINGS: CT HEAD FINDINGS Brain: No evidence of acute infarction, hemorrhage, hydrocephalus, extra-axial collection, visible mass lesion or mass effect. Vascular: Atherosclerotic  calcification of the carotid siphons. No hyperdense vessel. Skull: Chronic right supraorbital scarring. No acute soft tissue swelling, gas or laceration. No large scalp hematoma. No calvarial fracture or acute osseous injury within the included levels of imaging. Sinuses/Orbits: Chronic rightward anterior nasal septal deviation and in contacting left-sided nasal septal spur. Paranasal sinuses and mastoid air cells are predominantly clear. Middle ear cavities are clear. Included orbital structures are unremarkable. Other: None CT CERVICAL SPINE FINDINGS Alignment: Slight exaggeration of the upper cervical lordosis. Unchanged likely degenerative stepwise anterolisthesis C4-C7. Grossly unchanged alignment of a nonunited type 2 dens fracture and the asymmetric positioning of the lateral masses C1-C2. No acute traumatic listhesis is seen. Skull base and vertebrae: Redemonstration of the chronic, well corticated type 2 dens fracture with posterior translation and angulation. No acute cervical spine fracture is seen. No visible skull base fractures or other acute osseous abnormalities. No suspicious osseous lesions. Cervical spondylitic changes as below. Additional arthrosis about the atlantodental interval and basion dens intervals, unchanged from prior. Soft tissues and spinal canal: No pre or paravertebral fluid or swelling. No visible canal hematoma. Disc levels: Multilevel intervertebral disc height loss with spondylitic endplate changes. At most mild resulting canal stenosis C4-5, C5-6. Multilevel uncinate spurring and facet hypertrophic changes are present as well maximal C4-C7 where there is mild-to-moderate foraminal narrowing bilaterally. Upper chest: No acute abnormality in the upper chest or imaged lung apices. Stable emphysematous changes. Other: No concerning thyroid nodule or mass. IMPRESSION: 1. No acute intracranial abnormality. 2. Grossly unchanged alignment of the chronic, well corticated type 2 dens  fracture with posterior translation and angulation. 3. No acute cervical spine fracture or traumatic listhesis. 4. Multilevel degenerative changes of the cervical spine as described above. Electronically Signed   By: Lovena Le M.D.   On: 02/12/2021 23:20   CT KNEE RIGHT WO CONTRAST  Result Date: 02/12/2021 CLINICAL DATA:  Knee fracture, trip and fall while walking dogs. EXAM: CT OF THE RIGHT KNEE WITHOUT CONTRAST TECHNIQUE: Multidetector CT imaging of the RIGHT knee was performed according to the standard protocol. Multiplanar CT image reconstructions were also generated. COMPARISON:  Radiograph earlier today FINDINGS: Bones/Joint/Cartilage Mildly displaced and comminuted distal femur fracture. Spiral component involves the distal femoral metadiaphysis posteriorly, and extends to the lateral femoral cortex. There is mild impaction involving the posterolateral femoral condyle. There is intra-articular extension. Articular offset at the patellofemoral joint is 2-3 mm. Extension through the intercondylar notch posteriorly is nondisplaced. No acute fracture of the patella, tibia, or fibula. There is  a large lipohemarthrosis, which extends into a Baker cyst. Ligaments Suboptimally assessed by CT. Posterior cruciate ligament fibers remain intact. There is some indistinctness of the anterior cruciate ligament. Muscles and Tendons There is moderate intramuscular edema involving the posterior thigh muscle compartment. Mild intramuscular edema involving the posterior calf muscle compartment. Soft tissues Generalized soft tissue edema. IMPRESSION: 1. Mildly displaced and comminuted distal femur fracture with intra-articular extension. Fracture extends through the intercondylar notch. Articular offset at the patellofemoral joint is 2-3 mm. 2. Large lipohemarthrosis, which extends into a Baker cyst. Electronically Signed   By: Keith Rake M.D.   On: 02/12/2021 23:50   Chest Portable 1 View  Result Date:  02/13/2021 CLINICAL DATA:  60 year old female with preop chest radiograph. EXAM: PORTABLE CHEST 1 VIEW COMPARISON:  Chest radiograph dated 01/11/2021. FINDINGS: [age]. No focal consolidation, pleural effusion, pneumothorax. The cardiac silhouette is within limits. Osteopenia with degenerative changes of the spine. Old bilateral rib fractures. No acute osseous pathology. IMPRESSION: No active cardiopulmonary disease. Electronically Signed   By: Anner Crete M.D.   On: 02/13/2021 01:35   DG Knee Complete 4 Views Right  Result Date: 02/12/2021 CLINICAL DATA:  Fall, pain EXAM: RIGHT KNEE - COMPLETE 4+ VIEW COMPARISON:  None FINDINGS: There is an oblique minimally displaced fracture through the distal fibular metaphysis. This extends to the region of the lateral condyle. Large joint effusion. No subluxation or dislocation. IMPRESSION: Oblique distal femoral fracture.  Large joint effusion. Electronically Signed   By: Rolm Baptise M.D.   On: 02/12/2021 22:30    Review of Systems  Constitutional: Negative.   HENT: Negative.   Eyes: Negative.   Respiratory: Negative.   Cardiovascular: Negative.   Endocrine: Negative.   Genitourinary: Negative.   Musculoskeletal:       Right thigh and ankle pain  Skin: Negative.   Allergic/Immunologic: Negative.   Neurological: Negative.   Hematological: Negative.   Psychiatric/Behavioral: Negative.    Blood pressure 115/74, pulse 78, temperature 98.4 F (36.9 C), temperature source Oral, resp. rate 16, height 5\' 2"  (1.575 m), weight 40.4 kg, last menstrual period 10/08/2019, SpO2 98 %. Physical Exam HENT:     Head: Normocephalic.     Right Ear: External ear normal.     Left Ear: External ear normal.     Nose: Nose normal.     Mouth/Throat:     Mouth: Mucous membranes are moist.  Eyes:     Conjunctiva/sclera: Conjunctivae normal.  Cardiovascular:     Rate and Rhythm: Normal rate.     Pulses: Normal pulses.  Pulmonary:     Effort: Pulmonary  effort is normal.  Abdominal:     Palpations: Abdomen is soft.  Genitourinary:    Comments: Not pertinent to current symptomatology therefore not examined. Musculoskeletal:     Cervical back: Neck supple.     Comments: Patient resting comfortably on her right side in bed.  Ankle in a splint.  2+ dorsalis pedis pulses     Neurological:     Mental Status: She is alert.     Assessment Principal Problem:   Closed displaced oblique fracture of shaft of right femur ([place]) Active Problems:   Constipation   Alcohol abuse   Hypertension   COPD (chronic obstructive pulmonary disease) ([facility])   Hypomagnesemia   Leukocytosis   Tobacco abuse   Displaced fracture of lateral malleolus of right fibula, initial encounter for closed fracture   Plan: Plan is for Sunday am open reduction and internal  fixation of her right femur and right ankle.  The risks, benefits, and possible complications of the procedure were discussed in detail with the patient.  The patient is without question.   Kirstin J Shepperson 02/13/2021, 9:04 AM

## 2021-02-13 NOTE — Consult Note (Signed)
Reason for Consult:right femur and right fibula fracture Referring Physician: Dr Hughie Closs is an 60 y.o. female.  HPI: Beth [name] is a 60 y.o. female with medical history significant of alcohol abuse, intracranial hemorrhage, cervical cancer, COPD, hypertension, unspecified lung infection, history of lung nodule, tobacco abuse who is coming to the emergency department after having a fall while walking her 2 dogs landing on her right side, injuring her right knee and right ankle.  She states this was an accident as she lost her balance while the dogs were pushing from the leashes.  She denies chest pain, palpitations, dizziness, diaphoresis, nausea or emesis.  Denies fever, chills, rhinorrhea, sore throat, abdominal pain, diarrhea, constipation, melena or hematochezia.  No dysuria, frequency or materia.  No polyuria, polydipsia, polyphagia or blurred vision.  Past Medical History:  Diagnosis Date  . Alcohol abuse 10/26/2011  . Brain bleed ([place]) 2012  . Cervical cancer ([place])   . COPD (chronic obstructive pulmonary disease) ([place])   . Displaced fracture of lateral malleolus of right fibula, initial encounter for closed fracture 02/13/2021  . Hypertension   . Lung infection   . Tobacco abuse     Past Surgical History:  Procedure Laterality Date  . BIOPSY  02/04/2020   Procedure: BIOPSY;  Surgeon: Danie Binder, MD;  Location: [facility];  Service: Endoscopy;;  . BRONCHOSCOPY    . COLONOSCOPY WITH PROPOFOL N/A 02/04/2020   Procedure: COLONOSCOPY WITH PROPOFOL;  Surgeon: Danie Binder, MD;  Location: [facility];  Service: Endoscopy;  Laterality: N/A;  8:45am  . KNEE ARTHROSCOPY WITH MENISCAL REPAIR Right 08/25/2020   Procedure: KNEE ARTHROSCOPY WITH  MEDIAL MENISCAL REPAIR;  Surgeon: Carole Civil, MD;  Location: [facility];  Service: Orthopedics;  Laterality: Right;  . MULTIPLE TOOTH EXTRACTIONS    . POLYPECTOMY  02/04/2020   Procedure: POLYPECTOMY;  Surgeon: Danie Binder,  MD;  Location: [facility];  Service: Endoscopy;;  . rod in arm Left     Family History  Problem Relation Age of Onset  . Throat cancer Mother   . Lung cancer Father   . Throat cancer Brother   . Lung cancer Brother   . Colon cancer Neg Hx     Social History:  reports that she has been smoking cigarettes. She has a 8.75 pack-year smoking history. She has never used smokeless tobacco. She reports previous alcohol use. She reports that she does not use drugs.  Allergies:  Allergies  Allergen Reactions  . Amoxicillin Nausea Only  . Codeine Itching    Medications: I have reviewed the patient's current medications.  Results for orders placed or performed during the hospital encounter of 02/12/21 (from the past 48 hour(s))  Resp Panel by RT-PCR (Flu A&B, Covid) Nasopharyngeal Swab     Status: None   Collection Time: 02/12/21 11:10 PM   Specimen: Nasopharyngeal Swab; Nasopharyngeal(NP) swabs in vial transport medium  Result Value Ref Range   SARS Coronavirus 2 by RT PCR NEGATIVE NEGATIVE    Comment: (NOTE) SARS-CoV-2 target nucleic acids are NOT DETECTED.  The SARS-CoV-2 RNA is generally detectable in upper respiratory specimens during the acute phase of infection. The lowest concentration of SARS-CoV-2 viral copies this assay can detect is 138 copies/mL. A negative result does not preclude SARS-Cov-2 infection and should not be used as the sole basis for treatment or other patient management decisions. A negative result may occur with  improper specimen collection/handling, submission of  specimen other than nasopharyngeal swab, presence of viral mutation(s) within the areas targeted by this assay, and inadequate number of viral copies(<138 copies/mL). A negative result must be combined with clinical observations, patient history, and epidemiological information. The expected result is Negative.  Fact Sheet for Patients:  EntrepreneurPulse.com.au  Fact  Sheet for Healthcare Providers:  IncredibleEmployment.be  This test is no t yet approved or cleared by the [place] FDA and  has been authorized for detection and/or diagnosis of SARS-CoV-2 by FDA under an Emergency Use Authorization (EUA). This EUA will remain  in effect (meaning this test can be used) for the duration of the COVID-19 declaration under Section 564(b)(1) of the Act, 21 U.S.C.section 360bbb-3(b)(1), unless the authorization is terminated  or revoked sooner.       Influenza A by PCR NEGATIVE NEGATIVE   Influenza B by PCR NEGATIVE NEGATIVE    Comment: (NOTE) The Xpert Xpress SARS-CoV-2/FLU/RSV plus assay is intended as an aid in the diagnosis of influenza from Nasopharyngeal swab specimens and should not be used as a sole basis for treatment. Nasal washings and aspirates are unacceptable for Xpert Xpress SARS-CoV-2/FLU/RSV testing.  Fact Sheet for Patients: EntrepreneurPulse.com.au  Fact Sheet for Healthcare Providers: IncredibleEmployment.be  This test is not yet approved or cleared by the [place] FDA and has been authorized for detection and/or diagnosis of SARS-CoV-2 by FDA under an Emergency Use Authorization (EUA). This EUA will remain in effect (meaning this test can be used) for the duration of the COVID-19 declaration under Section 564(b)(1) of the Act, 21 U.S.C. section 360bbb-3(b)(1), unless the authorization is terminated or revoked.  Performed at [facility] [place] [facility], [address] [place], [place] [address]   CBC with Differential     Status: Abnormal   Collection Time: 02/12/21 11:27 PM  Result Value Ref Range   WBC 12.8 (H) 4.0 - 10.5 K/uL   RBC 3.95 3.87 - 5.11 MIL/uL   Hemoglobin 12.4 12.0 - 15.0 g/dL   HCT 38.4 36.0 - 46.0 %   MCV 97.2 80.0 - 100.0 fL   MCH 31.4 26.0 - 34.0 pg   MCHC 32.3 30.0 - 36.0 g/dL   RDW 13.1 11.5 - 15.5 %   Platelets 361 150 - 400 K/uL   nRBC 0.0 0.0 -  0.2 %   Neutrophils Relative % 57 %   Neutro Abs 7.4 1.7 - 7.7 K/uL   Lymphocytes Relative 31 %   Lymphs Abs 3.9 0.7 - 4.0 K/uL   Monocytes Relative 9 %   Monocytes Absolute 1.2 (H) 0.1 - 1.0 K/uL   Eosinophils Relative 2 %   Eosinophils Absolute 0.2 0.0 - 0.5 K/uL   Basophils Relative 1 %   Basophils Absolute 0.1 0.0 - 0.1 K/uL   Immature Granulocytes 0 %   Abs Immature Granulocytes 0.05 0.00 - 0.07 K/uL    Comment: Performed at [facility], [address] [place], [place] [address]  Basic metabolic panel     Status: Abnormal   Collection Time: 02/12/21 11:27 PM  Result Value Ref Range   Sodium 135 135 - 145 mmol/L   Potassium 3.6 3.5 - 5.1 mmol/L   Chloride 104 98 - 111 mmol/L   CO2 20 (L) 22 - 32 mmol/L   Glucose, Bld 90 70 - 99 mg/dL    Comment: Glucose reference range applies only to samples taken after fasting for at least 8 hours.   BUN 6 6 - 20 mg/dL   Creatinine, Ser 0.50 0.44 -  1.00 mg/dL   Calcium 9.1 8.9 - 10.3 mg/dL   GFR, Estimated >60 >60 mL/min    Comment: (NOTE) Calculated using the CKD-EPI Creatinine Equation (2021)    Anion gap 11 5 - 15    Comment: Performed at [facility], [address], [place], [place] [address]  Magnesium     Status: Abnormal   Collection Time: 02/12/21 11:27 PM  Result Value Ref Range   Magnesium 1.5 (L) 1.7 - 2.4 mg/dL    Comment: Performed at [facility], [address], [place], [place] [address]  Phosphorus     Status: None   Collection Time: 02/12/21 11:27 PM  Result Value Ref Range   Phosphorus 4.3 2.5 - 4.6 mg/dL    Comment: Performed at [facility], [address] [place], [place] [address]  Hemoglobin and hematocrit, blood     Status: Abnormal   Collection Time: 02/13/21  7:08 AM  Result Value Ref Range   Hemoglobin 12.1 12.0 - 15.0 g/dL   HCT 35.6 (L) 36.0 - 46.0 %    Comment: Performed at [facility] [address] [place], [place] [address]    DG Ankle Complete Right  Result Date: 02/12/2021 CLINICAL  DATA:  Fall EXAM: RIGHT ANKLE - COMPLETE 3+ VIEW COMPARISON:  None. FINDINGS: Nondisplaced distal fibular fracture noted at the level of the ankle mortise. No tibial abnormality. Joint space maintained. Lateral soft tissue swelling. IMPRESSION: Nondisplaced distal fibular fracture. Electronically Signed   By: Rolm Baptise M.D.   On: 02/12/2021 22:28   CT Head Wo Contrast  Result Date: 02/12/2021 CLINICAL DATA:  Fall EXAM: CT HEAD WITHOUT CONTRAST CT CERVICAL SPINE WITHOUT CONTRAST TECHNIQUE: Multidetector CT imaging of the head and cervical spine was performed following the standard protocol without intravenous contrast. Multiplanar CT image reconstructions of the cervical spine were also generated. COMPARISON:  CT head and cervical spine 10/09/2019 (report only, images unavailable) CT head and cervical spine 07/06/2019 FINDINGS: CT HEAD FINDINGS Brain: No evidence of acute infarction, hemorrhage, hydrocephalus, extra-axial collection, visible mass lesion or mass effect. Vascular: Atherosclerotic calcification of the carotid siphons. No hyperdense vessel. Skull: Chronic right supraorbital scarring. No acute soft tissue swelling, gas or laceration. No large scalp hematoma. No calvarial fracture or acute osseous injury within the included levels of imaging. Sinuses/Orbits: Chronic rightward anterior nasal septal deviation and in contacting left-sided nasal septal spur. Paranasal sinuses and mastoid air cells are predominantly clear. Middle ear cavities are clear. Included orbital structures are unremarkable. Other: None CT CERVICAL SPINE FINDINGS Alignment: Slight exaggeration of the upper cervical lordosis. Unchanged likely degenerative stepwise anterolisthesis C4-C7. Grossly unchanged alignment of a nonunited type 2 dens fracture and the asymmetric positioning of the lateral masses C1-C2. No acute traumatic listhesis is seen. Skull base and vertebrae: Redemonstration of the chronic, well corticated type 2 dens  fracture with posterior translation and angulation. No acute cervical spine fracture is seen. No visible skull base fractures or other acute osseous abnormalities. No suspicious osseous lesions. Cervical spondylitic changes as below. Additional arthrosis about the atlantodental interval and basion dens intervals, unchanged from prior. Soft tissues and spinal canal: No pre or paravertebral fluid or swelling. No visible canal hematoma. Disc levels: Multilevel intervertebral disc height loss with spondylitic endplate changes. At most mild resulting canal stenosis C4-5, C5-6. Multilevel uncinate spurring and facet hypertrophic changes are present as well maximal C4-C7 where there is mild-to-moderate foraminal narrowing bilaterally. Upper chest: No acute abnormality in the upper chest or  imaged lung apices. Stable emphysematous changes. Other: No concerning thyroid nodule or mass. IMPRESSION: 1. No acute intracranial abnormality. 2. Grossly unchanged alignment of the chronic, well corticated type 2 dens fracture with posterior translation and angulation. 3. No acute cervical spine fracture or traumatic listhesis. 4. Multilevel degenerative changes of the cervical spine as described above. Electronically Signed   By: Lovena Le M.D.   On: 02/12/2021 23:20   CT Cervical Spine Wo Contrast  Result Date: 02/12/2021 CLINICAL DATA:  Fall EXAM: CT HEAD WITHOUT CONTRAST CT CERVICAL SPINE WITHOUT CONTRAST TECHNIQUE: Multidetector CT imaging of the head and cervical spine was performed following the standard protocol without intravenous contrast. Multiplanar CT image reconstructions of the cervical spine were also generated. COMPARISON:  CT head and cervical spine 10/09/2019 (report only, images unavailable) CT head and cervical spine 07/06/2019 FINDINGS: CT HEAD FINDINGS Brain: No evidence of acute infarction, hemorrhage, hydrocephalus, extra-axial collection, visible mass lesion or mass effect. Vascular: Atherosclerotic  calcification of the carotid siphons. No hyperdense vessel. Skull: Chronic right supraorbital scarring. No acute soft tissue swelling, gas or laceration. No large scalp hematoma. No calvarial fracture or acute osseous injury within the included levels of imaging. Sinuses/Orbits: Chronic rightward anterior nasal septal deviation and in contacting left-sided nasal septal spur. Paranasal sinuses and mastoid air cells are predominantly clear. Middle ear cavities are clear. Included orbital structures are unremarkable. Other: None CT CERVICAL SPINE FINDINGS Alignment: Slight exaggeration of the upper cervical lordosis. Unchanged likely degenerative stepwise anterolisthesis C4-C7. Grossly unchanged alignment of a nonunited type 2 dens fracture and the asymmetric positioning of the lateral masses C1-C2. No acute traumatic listhesis is seen. Skull base and vertebrae: Redemonstration of the chronic, well corticated type 2 dens fracture with posterior translation and angulation. No acute cervical spine fracture is seen. No visible skull base fractures or other acute osseous abnormalities. No suspicious osseous lesions. Cervical spondylitic changes as below. Additional arthrosis about the atlantodental interval and basion dens intervals, unchanged from prior. Soft tissues and spinal canal: No pre or paravertebral fluid or swelling. No visible canal hematoma. Disc levels: Multilevel intervertebral disc height loss with spondylitic endplate changes. At most mild resulting canal stenosis C4-5, C5-6. Multilevel uncinate spurring and facet hypertrophic changes are present as well maximal C4-C7 where there is mild-to-moderate foraminal narrowing bilaterally. Upper chest: No acute abnormality in the upper chest or imaged lung apices. Stable emphysematous changes. Other: No concerning thyroid nodule or mass. IMPRESSION: 1. No acute intracranial abnormality. 2. Grossly unchanged alignment of the chronic, well corticated type 2 dens  fracture with posterior translation and angulation. 3. No acute cervical spine fracture or traumatic listhesis. 4. Multilevel degenerative changes of the cervical spine as described above. Electronically Signed   By: Lovena Le M.D.   On: 02/12/2021 23:20   CT KNEE RIGHT WO CONTRAST  Result Date: 02/12/2021 CLINICAL DATA:  Knee fracture, trip and fall while walking dogs. EXAM: CT OF THE RIGHT KNEE WITHOUT CONTRAST TECHNIQUE: Multidetector CT imaging of the RIGHT knee was performed according to the standard protocol. Multiplanar CT image reconstructions were also generated. COMPARISON:  Radiograph earlier today FINDINGS: Bones/Joint/Cartilage Mildly displaced and comminuted distal femur fracture. Spiral component involves the distal femoral metadiaphysis posteriorly, and extends to the lateral femoral cortex. There is mild impaction involving the posterolateral femoral condyle. There is intra-articular extension. Articular offset at the patellofemoral joint is 2-3 mm. Extension through the intercondylar notch posteriorly is nondisplaced. No acute fracture of the patella, tibia, or fibula. There is  a large lipohemarthrosis, which extends into a Baker cyst. Ligaments Suboptimally assessed by CT. Posterior cruciate ligament fibers remain intact. There is some indistinctness of the anterior cruciate ligament. Muscles and Tendons There is moderate intramuscular edema involving the posterior thigh muscle compartment. Mild intramuscular edema involving the posterior calf muscle compartment. Soft tissues Generalized soft tissue edema. IMPRESSION: 1. Mildly displaced and comminuted distal femur fracture with intra-articular extension. Fracture extends through the intercondylar notch. Articular offset at the patellofemoral joint is 2-3 mm. 2. Large lipohemarthrosis, which extends into a Baker cyst. Electronically Signed   By: Keith Rake M.D.   On: 02/12/2021 23:50   Chest Portable 1 View  Result Date:  02/13/2021 CLINICAL DATA:  60 year old female with preop chest radiograph. EXAM: PORTABLE CHEST 1 VIEW COMPARISON:  Chest radiograph dated 01/11/2021. FINDINGS: [age]. No focal consolidation, pleural effusion, pneumothorax. The cardiac silhouette is within limits. Osteopenia with degenerative changes of the spine. Old bilateral rib fractures. No acute osseous pathology. IMPRESSION: No active cardiopulmonary disease. Electronically Signed   By: Anner Crete M.D.   On: 02/13/2021 01:35   DG Knee Complete 4 Views Right  Result Date: 02/12/2021 CLINICAL DATA:  Fall, pain EXAM: RIGHT KNEE - COMPLETE 4+ VIEW COMPARISON:  None FINDINGS: There is an oblique minimally displaced fracture through the distal fibular metaphysis. This extends to the region of the lateral condyle. Large joint effusion. No subluxation or dislocation. IMPRESSION: Oblique distal femoral fracture.  Large joint effusion. Electronically Signed   By: Rolm Baptise M.D.   On: 02/12/2021 22:30    Review of Systems  Constitutional: Negative.   HENT: Negative.   Eyes: Negative.   Respiratory: Negative.   Cardiovascular: Negative.   Endocrine: Negative.   Genitourinary: Negative.   Musculoskeletal:       Right thigh and ankle pain  Skin: Negative.   Allergic/Immunologic: Negative.   Neurological: Negative.   Hematological: Negative.   Psychiatric/Behavioral: Negative.    Blood pressure 115/74, pulse 78, temperature 98.4 F (36.9 C), temperature source Oral, resp. rate 16, height 5\' 2"  (1.575 m), weight 40.4 kg, last menstrual period 10/08/2019, SpO2 98 %. Physical Exam HENT:     Head: Normocephalic.     Right Ear: External ear normal.     Left Ear: External ear normal.     Nose: Nose normal.     Mouth/Throat:     Mouth: Mucous membranes are moist.  Eyes:     Conjunctiva/sclera: Conjunctivae normal.  Cardiovascular:     Rate and Rhythm: Normal rate.     Pulses: Normal pulses.  Pulmonary:     Effort: Pulmonary  effort is normal.  Abdominal:     Palpations: Abdomen is soft.  Genitourinary:    Comments: Not pertinent to current symptomatology therefore not examined. Musculoskeletal:     Cervical back: Neck supple.     Comments: Patient resting comfortably on her right side in bed.  Ankle in a splint.  2+ dorsalis pedis pulses     Neurological:     Mental Status: She is alert.     Assessment Principal Problem:   Closed displaced oblique fracture of shaft of right femur ([place]) Active Problems:   Constipation   Alcohol abuse   Hypertension   COPD (chronic obstructive pulmonary disease) (H[facility])   Hypomagnesemia   Leukocytosis   Tobacco abuse   Displaced fracture of lateral malleolus of right fibula, initial encounter for closed fracture   Plan: Plan is for Sunday am open reduction and internal  fixation of her right femur and right ankle.  The risks, benefits, and possible complications of the procedure were discussed in detail with the patient.  The patient is without question.    J  02/13/2021, 9:04 AM

## 2021-02-13 NOTE — H&P (Signed)
History and Physical    Beth [name]:[id number] DOB: 01-24-1961 DOA: 02/12/2021  PCP: Lucia Gaskins, MD   Patient coming from: Home.   I have personally briefly reviewed patient's old medical records in [place]  Chief Complaint: Fall.  HPI: Beth [name] is a 60 y.o. female with medical history significant of alcohol abuse, intracranial hemorrhage, cervical cancer, COPD, hypertension, unspecified lung infection, history of lung nodule, tobacco abuse who is coming to the emergency department after having a fall while walking her 2 dogs landing on her right side, injuring her right knee and right ankle.  She states this was an accident as she lost her balance while the dogs were pushing from the leashes.  She denies chest pain, palpitations, dizziness, diaphoresis, nausea or emesis.  Denies fever, chills, rhinorrhea, sore throat, abdominal pain, diarrhea, constipation, melena or hematochezia.  No dysuria, frequency or materia.  No polyuria, polydipsia, polyphagia or blurred vision.  ED Course: Initial vital signs were 98.4 F, pulse 86, respiration 18, BP 114/76 O2 sat 99% on room air.  The patient had temporary immobilization of the affected areas in the ED.  Labwork: CBC shows a white count 12.8, hemoglobin 12.4 g/dL platelets 361.  BMP shows CO2 of 20 mmol/L, but was otherwise normal.  Phosphorus 4.3 and magnesium 1.5 mg/dL.  Imaging: Imaging shows oblique distal femoral fracture and distal fibular fracture. Old rib fractures seen on chest radiograph. Please see images in full radiology report for further detail.    Review of Systems: As per HPI otherwise all other systems reviewed and are negative.  Past Medical History:  Diagnosis Date  . Alcohol abuse 10/26/2011  . Brain bleed ([place]) 2012  . Cervical cancer ([place])   . COPD (chronic obstructive pulmonary disease) ([place])   . Hypertension   . Lung infection   . Tobacco abuse     Past Surgical History:  Procedure  Laterality Date  . BIOPSY  02/04/2020   Procedure: BIOPSY;  Surgeon: Danie Binder, MD;  Location: [facility];  Service: Endoscopy;;  . BRONCHOSCOPY    . COLONOSCOPY WITH PROPOFOL N/A 02/04/2020   Procedure: COLONOSCOPY WITH PROPOFOL;  Surgeon: Danie Binder, MD;  Location: [facility];  Service: Endoscopy;  Laterality: N/A;  8:45am  . KNEE ARTHROSCOPY WITH MENISCAL REPAIR Right 08/25/2020   Procedure: KNEE ARTHROSCOPY WITH  MEDIAL MENISCAL REPAIR;  Surgeon: Carole Civil, MD;  Location: [facility];  Service: Orthopedics;  Laterality: Right;  . MULTIPLE TOOTH EXTRACTIONS    . POLYPECTOMY  02/04/2020   Procedure: POLYPECTOMY;  Surgeon: Danie Binder, MD;  Location: [facility];  Service: Endoscopy;;  . rod in arm Left     Social History  reports that she has been smoking cigarettes. She has a 8.75 pack-year smoking history. She has never used smokeless tobacco. She reports previous alcohol use. She reports that she does not use drugs.  Allergies  Allergen Reactions  . Amoxicillin Nausea Only  . Codeine Itching    Family History  Problem Relation Age of Onset  . Throat cancer Mother   . Lung cancer Father   . Throat cancer Brother   . Lung cancer Brother   . Colon cancer Neg Hx    Prior to Admission medications   Medication Sig Start Date End Date Taking? Authorizing Provider  albuterol (PROVENTIL HFA;VENTOLIN HFA) 108 (90 BASE) MCG/ACT inhaler Inhale 1-2 puffs into the lungs every 6 (six) hours as needed for wheezing  or shortness of breath.    [provider]  amLODipine (NORVASC) 5 MG tablet Take 5 mg by mouth daily. 08/14/20   [provider]  Cholecalciferol (VITAMIN D3) 125 MCG (5000 UT) TABS Take 5,000 Units by mouth daily.     [provider]  MAGNESIUM PO Take 1 tablet by mouth daily.    [provider]  Multiple Vitamin (MULTIVITAMIN WITH MINERALS) TABS tablet Take 1 tablet by mouth daily.    [provider]  naproxen  (NAPROSYN) 500 MG tablet Take 500 mg by mouth 2 (two) times daily with a meal.    [provider]  oxycodone (OXY-IR) 5 MG capsule Take 5 mg by mouth every 4 (four) hours as needed.    [provider]    Physical Exam: Vitals:   02/12/21 2036 02/12/21 2130 02/13/21 0024 02/13/21 0100  BP: 114/76 104/67 114/63 108/62  Pulse: 86     Resp: 18  19 18   Temp: 98.4 F (36.9 C)     SpO2: 99%  99%   Weight: 40.4 kg     Height: 5\' 2"  (1.575 m)      Constitutional: Cachectic, chronically ill-appearing. Eyes: PERRL, sclerae, lids and conjunctivae mildly injected. ENMT: Mucous membranes are moist. Posterior pharynx clear of any exudate or lesions. Neck: normal, supple, no masses, no thyromegaly Respiratory: clear to auscultation bilaterally, no wheezing, no crackles. Normal respiratory effort. No accessory muscle use.  Cardiovascular: Regular rate and rhythm, no murmurs / rubs / gallops. No extremity edema. 2+ pedal pulses. No carotid bruits.  Abdomen: No distention.  Bowel sounds positive.  Soft, no tenderness, no masses palpated. No hepatosplenomegaly. Bowel sounds positive.  Musculoskeletal: Positive tenderness on right knee down right ankle area with severely decreased ROM of these joints.  No clubbing / cyanosis.  No contractures. Normal muscle tone.  Skin: no rashes, lesions, ulcers on very limited dermatological examination. Neurologic: CN 2-12 grossly intact. Sensation intact, DTR normal. Strength 5/5 in all 4.  Psychiatric: Normal judgment and insight. Alert and oriented x 3. Normal mood.   Labs on Admission: I have personally reviewed following labs and imaging studies  CBC: Recent Labs  Lab 02/12/21 2327  WBC 12.8*  NEUTROABS 7.4  HGB 12.4  HCT 38.4  MCV 97.2  PLT 734    Basic Metabolic Panel: Recent Labs  Lab 02/12/21 2327  NA 135  K 3.6  CL 104  CO2 20*  GLUCOSE 90  BUN 6  CREATININE 0.50  CALCIUM 9.1  MG 1.5*  PHOS 4.3    GFR: Estimated  Creatinine Clearance: 48.9 mL/min (by C-G formula based on SCr of 0.5 mg/dL).  Liver Function Tests: No results for input(s): AST, ALT, ALKPHOS, BILITOT, PROT, ALBUMIN in the last 168 hours.  Radiological Exams on Admission: DG Ankle Complete Right  Result Date: 02/12/2021 CLINICAL DATA:  Fall EXAM: RIGHT ANKLE - COMPLETE 3+ VIEW COMPARISON:  None. FINDINGS: Nondisplaced distal fibular fracture noted at the level of the ankle mortise. No tibial abnormality. Joint space maintained. Lateral soft tissue swelling. IMPRESSION: Nondisplaced distal fibular fracture. Electronically Signed   By: Rolm Baptise M.D.   On: 02/12/2021 22:28   CT Head Wo Contrast  Result Date: 02/12/2021 CLINICAL DATA:  Fall EXAM: CT HEAD WITHOUT CONTRAST CT CERVICAL SPINE WITHOUT CONTRAST TECHNIQUE: Multidetector CT imaging of the head and cervical spine was performed following the standard protocol without intravenous contrast. Multiplanar CT image reconstructions of the cervical spine were also generated. COMPARISON:  CT head and cervical spine 10/09/2019 (report only, images unavailable) CT head and cervical spine 07/06/2019 FINDINGS: CT HEAD FINDINGS Brain: No evidence of acute infarction, hemorrhage, hydrocephalus, extra-axial collection, visible mass lesion or mass effect. Vascular: Atherosclerotic calcification of the carotid siphons. No hyperdense vessel. Skull: Chronic right supraorbital scarring. No acute soft tissue swelling, gas or laceration. No large scalp hematoma. No calvarial fracture or acute osseous injury within the included levels of imaging. Sinuses/Orbits: Chronic rightward anterior nasal septal deviation and in contacting left-sided nasal septal spur. Paranasal sinuses and mastoid air cells are predominantly clear. Middle ear cavities are clear. Included orbital structures are unremarkable. Other: None CT CERVICAL SPINE FINDINGS Alignment: Slight exaggeration of the upper cervical lordosis. Unchanged likely  degenerative stepwise anterolisthesis C4-C7. Grossly unchanged alignment of a nonunited type 2 dens fracture and the asymmetric positioning of the lateral masses C1-C2. No acute traumatic listhesis is seen. Skull base and vertebrae: Redemonstration of the chronic, well corticated type 2 dens fracture with posterior translation and angulation. No acute cervical spine fracture is seen. No visible skull base fractures or other acute osseous abnormalities. No suspicious osseous lesions. Cervical spondylitic changes as below. Additional arthrosis about the atlantodental interval and basion dens intervals, unchanged from prior. Soft tissues and spinal canal: No pre or paravertebral fluid or swelling. No visible canal hematoma. Disc levels: Multilevel intervertebral disc height loss with spondylitic endplate changes. At most mild resulting canal stenosis C4-5, C5-6. Multilevel uncinate spurring and facet hypertrophic changes are present as well maximal C4-C7 where there is mild-to-moderate foraminal narrowing bilaterally. Upper chest: No acute abnormality in the upper chest or imaged lung apices. Stable emphysematous changes. Other: No concerning thyroid nodule or mass. IMPRESSION: 1. No acute intracranial abnormality. 2. Grossly unchanged alignment of the chronic, well corticated type 2 dens fracture with posterior translation and angulation. 3. No acute cervical spine fracture or traumatic listhesis. 4. Multilevel degenerative changes of the cervical spine as described above. Electronically Signed   By: Lovena Le M.D.   On: 02/12/2021 23:20   CT Cervical Spine Wo Contrast  Result Date: 02/12/2021 CLINICAL DATA:  Fall EXAM: CT HEAD WITHOUT CONTRAST CT CERVICAL SPINE WITHOUT CONTRAST TECHNIQUE: Multidetector CT imaging of the head and cervical spine was performed following the standard protocol without intravenous contrast. Multiplanar CT image reconstructions of the cervical spine were also generated. COMPARISON:   CT head and cervical spine 10/09/2019 (report only, images unavailable) CT head and cervical spine 07/06/2019 FINDINGS: CT HEAD FINDINGS Brain: No evidence of acute infarction, hemorrhage, hydrocephalus, extra-axial collection, visible mass lesion or mass effect. Vascular: Atherosclerotic calcification of the carotid siphons. No hyperdense vessel. Skull: Chronic right supraorbital scarring. No acute soft tissue swelling, gas or laceration. No large scalp hematoma. No calvarial fracture or acute osseous injury within the included levels of imaging. Sinuses/Orbits: Chronic rightward anterior nasal septal deviation and in contacting left-sided nasal septal spur. Paranasal sinuses and mastoid air cells are predominantly clear. Middle ear cavities are clear. Included orbital structures are unremarkable. Other: None CT CERVICAL SPINE FINDINGS Alignment: Slight exaggeration of the upper cervical lordosis. Unchanged likely degenerative stepwise anterolisthesis C4-C7. Grossly unchanged alignment of a nonunited type 2 dens fracture and the asymmetric positioning of the lateral masses C1-C2. No acute traumatic listhesis is seen. Skull base and vertebrae: Redemonstration of the chronic, well corticated type 2 dens fracture with posterior translation and angulation. No acute cervical spine fracture is seen. No visible skull base fractures or other acute osseous abnormalities. No suspicious osseous  lesions. Cervical spondylitic changes as below. Additional arthrosis about the atlantodental interval and basion dens intervals, unchanged from prior. Soft tissues and spinal canal: No pre or paravertebral fluid or swelling. No visible canal hematoma. Disc levels: Multilevel intervertebral disc height loss with spondylitic endplate changes. At most mild resulting canal stenosis C4-5, C5-6. Multilevel uncinate spurring and facet hypertrophic changes are present as well maximal C4-C7 where there is mild-to-moderate foraminal narrowing  bilaterally. Upper chest: No acute abnormality in the upper chest or imaged lung apices. Stable emphysematous changes. Other: No concerning thyroid nodule or mass. IMPRESSION: 1. No acute intracranial abnormality. 2. Grossly unchanged alignment of the chronic, well corticated type 2 dens fracture with posterior translation and angulation. 3. No acute cervical spine fracture or traumatic listhesis. 4. Multilevel degenerative changes of the cervical spine as described above. Electronically Signed   By: Lovena Le M.D.   On: 02/12/2021 23:20   CT KNEE RIGHT WO CONTRAST  Result Date: 02/12/2021 CLINICAL DATA:  Knee fracture, trip and fall while walking dogs. EXAM: CT OF THE RIGHT KNEE WITHOUT CONTRAST TECHNIQUE: Multidetector CT imaging of the RIGHT knee was performed according to the standard protocol. Multiplanar CT image reconstructions were also generated. COMPARISON:  Radiograph earlier today FINDINGS: Bones/Joint/Cartilage Mildly displaced and comminuted distal femur fracture. Spiral component involves the distal femoral metadiaphysis posteriorly, and extends to the lateral femoral cortex. There is mild impaction involving the posterolateral femoral condyle. There is intra-articular extension. Articular offset at the patellofemoral joint is 2-3 mm. Extension through the intercondylar notch posteriorly is nondisplaced. No acute fracture of the patella, tibia, or fibula. There is a large lipohemarthrosis, which extends into a Baker cyst. Ligaments Suboptimally assessed by CT. Posterior cruciate ligament fibers remain intact. There is some indistinctness of the anterior cruciate ligament. Muscles and Tendons There is moderate intramuscular edema involving the posterior thigh muscle compartment. Mild intramuscular edema involving the posterior calf muscle compartment. Soft tissues Generalized soft tissue edema. IMPRESSION: 1. Mildly displaced and comminuted distal femur fracture with intra-articular extension.  Fracture extends through the intercondylar notch. Articular offset at the patellofemoral joint is 2-3 mm. 2. Large lipohemarthrosis, which extends into a Baker cyst. Electronically Signed   By: Keith Rake M.D.   On: 02/12/2021 23:50   Chest Portable 1 View  Result Date: 02/13/2021 CLINICAL DATA:  60 year old female with preop chest radiograph. EXAM: PORTABLE CHEST 1 VIEW COMPARISON:  Chest radiograph dated 01/11/2021. FINDINGS: Fifty-one. No focal consolidation, pleural effusion, pneumothorax. The cardiac silhouette is within limits. Osteopenia with degenerative changes of the spine. Old bilateral rib fractures. No acute osseous pathology. IMPRESSION: No active cardiopulmonary disease. Electronically Signed   By: Anner Crete M.D.   On: 02/13/2021 01:35   DG Knee Complete 4 Views Right  Result Date: 02/12/2021 CLINICAL DATA:  Fall, pain EXAM: RIGHT KNEE - COMPLETE 4+ VIEW COMPARISON:  None FINDINGS: There is an oblique minimally displaced fracture through the distal fibular metaphysis. This extends to the region of the lateral condyle. Large joint effusion. No subluxation or dislocation. IMPRESSION: Oblique distal femoral fracture.  Large joint effusion. Electronically Signed   By: Rolm Baptise M.D.   On: 02/12/2021 22:30    EKG: Independently reviewed.  Vent. rate 77 BPM PR interval * ms QRS duration 114 ms QT/QTc 420/476 ms P-R-T axes 63 74 71 Sinus rhythm Incomplete right bundle branch block  Assessment/Plan Principal Problem:   Closed displaced oblique fracture of shaft of right femur (HCC) Admit to telemetry/inpatient. Keep NPO. Analgesics  as needed. Antiemetics as needed. Obtain preop chest radiograph. Obtain preop EKG. Orthopedic surgery to evaluate. Consult nutritional services. Consult TOC.  Active Problems:   Constipation Docusate sodium 100 mg p.o. twice daily. Magnesium oxide 400 mg p.o. twice daily.    Alcohol abuse Begin CIWA protocol. MVI, folate and  thiamine supplementation.    Hypertension Hold amlodipine due to low normal BP.    COPD (chronic obstructive pulmonary disease) (HCC) Supplemental oxygen bronchodilators as needed.    Hypomagnesemia Replaced.    Leukocytosis Secondary to acute stress. Monitor WBC.    Tobacco abuse Smoking cessation advised. Nicotine replacement therapy order.    Cachexia Check albumin level. Consult nutritional services.   DVT prophylaxis: Heparin SQ. Code Status:   Full code. Family Communication: Disposition Plan:   Patient is from:  Home.  Anticipated DC to:  Home.  Anticipated DC date:  02/16/2021.  Anticipated DC barriers: Clinical status and consultant sign off.  Consults called:  Orthopedic surgery (Dr. Mardelle Matte). Admission status:  Telemetry/inpatient.   Severity of Illness: High given fractures of multiple sites will likely require surgical repair.  The patient will need to be transferred to [facility] for evaluation by orthopedic surgery.   Reubin Milan MD [facility]  How to contact the [facility] Attending or Consulting provider [place] or covering provider during after hours [place], for this patient?   1. Check the care team in [facility] and look for a) attending/consulting [facility] provider listed and b) the [facility] team listed 2. Log into [email or web address] and use [place]'s universal password to access. If you do not have the password, please contact the hospital operator. 3. Locate the [facility] provider you are looking for under [facility] and page to a number that you can be directly reached. 4. If you still have difficulty reaching the provider, please page the [facility] (Director on Call) for the Hospitalists listed on amion for assistance.  02/13/2021, 2:17 AM   This document was prepared using Dragon voice recognition software and may contain some unintended transcription errors.

## 2021-02-14 ENCOUNTER — Inpatient Hospital Stay (HOSPITAL_COMMUNITY): Payer: Medicaid Other | Admitting: Anesthesiology

## 2021-02-14 ENCOUNTER — Inpatient Hospital Stay (HOSPITAL_COMMUNITY): Payer: Medicaid Other

## 2021-02-14 ENCOUNTER — Encounter (HOSPITAL_COMMUNITY)
Admission: EM | Disposition: A | Discharge status: Home Health | Payer: Self-pay | Source: Home / Self Care | Attending: Internal Medicine

## 2021-02-14 ENCOUNTER — Encounter (HOSPITAL_COMMUNITY): Payer: Self-pay | Admitting: Internal Medicine

## 2021-02-14 DIAGNOSIS — D72829 Elevated white blood cell count, unspecified: Secondary | ICD-10-CM | POA: Diagnosis not present

## 2021-02-14 DIAGNOSIS — Z720 Tobacco use: Secondary | ICD-10-CM

## 2021-02-14 DIAGNOSIS — S8261XA Displaced fracture of lateral malleolus of right fibula, initial encounter for closed fracture: Secondary | ICD-10-CM | POA: Diagnosis not present

## 2021-02-14 DIAGNOSIS — S72331A Displaced oblique fracture of shaft of right femur, initial encounter for closed fracture: Secondary | ICD-10-CM | POA: Diagnosis not present

## 2021-02-14 HISTORY — PX: ORIF ANKLE FRACTURE: SHX5408

## 2021-02-14 HISTORY — PX: ORIF FEMUR FRACTURE: SHX2119

## 2021-02-14 LAB — CBC WITH DIFFERENTIAL/PLATELET
Abs Immature Granulocytes: 0.03 10*3/uL (ref 0.00–0.07)
Basophils Absolute: 0 10*3/uL (ref 0.0–0.1)
Basophils Relative: 0 %
Eosinophils Absolute: 0.1 10*3/uL (ref 0.0–0.5)
Eosinophils Relative: 1 %
HCT: 35.6 % — ABNORMAL LOW (ref 36.0–46.0)
Hemoglobin: 12.1 g/dL (ref 12.0–15.0)
Immature Granulocytes: 0 %
Lymphocytes Relative: 27 %
Lymphs Abs: 2.8 10*3/uL (ref 0.7–4.0)
MCH: 32 pg (ref 26.0–34.0)
MCHC: 34 g/dL (ref 30.0–36.0)
MCV: 94.2 fL (ref 80.0–100.0)
Monocytes Absolute: 1.6 10*3/uL — ABNORMAL HIGH (ref 0.1–1.0)
Monocytes Relative: 15 %
Neutro Abs: 6 10*3/uL (ref 1.7–7.7)
Neutrophils Relative %: 57 %
Platelets: 333 10*3/uL (ref 150–400)
RBC: 3.78 MIL/uL — ABNORMAL LOW (ref 3.87–5.11)
RDW: 13.2 % (ref 11.5–15.5)
WBC: 10.6 10*3/uL — ABNORMAL HIGH (ref 4.0–10.5)
nRBC: 0 % (ref 0.0–0.2)

## 2021-02-14 LAB — BASIC METABOLIC PANEL
Anion gap: 8 (ref 5–15)
BUN: 9 mg/dL (ref 6–20)
CO2: 23 mmol/L (ref 22–32)
Calcium: 9.1 mg/dL (ref 8.9–10.3)
Chloride: 106 mmol/L (ref 98–111)
Creatinine, Ser: 0.61 mg/dL (ref 0.44–1.00)
GFR, Estimated: >60 mL/min (ref >60)
Glucose, Bld: 114 mg/dL — ABNORMAL HIGH (ref 70–99)
Potassium: 3.9 mmol/L (ref 3.5–5.1)
Sodium: 137 mmol/L (ref 135–145)

## 2021-02-14 LAB — CBC
HCT: 34.7 % — ABNORMAL LOW (ref 36.0–46.0)
Hemoglobin: 11.8 g/dL — ABNORMAL LOW (ref 12.0–15.0)
MCH: 32.6 pg (ref 26.0–34.0)
MCHC: 34 g/dL (ref 30.0–36.0)
MCV: 95.9 fL (ref 80.0–100.0)
Platelets: 303 10*3/uL (ref 150–400)
RBC: 3.62 MIL/uL — ABNORMAL LOW (ref 3.87–5.11)
RDW: 13.3 % (ref 11.5–15.5)
WBC: 13.8 10*3/uL — ABNORMAL HIGH (ref 4.0–10.5)
nRBC: 0 % (ref 0.0–0.2)

## 2021-02-14 LAB — CREATININE, SERUM
Creatinine, Ser: 0.67 mg/dL (ref 0.44–1.00)
GFR, Estimated: >60 mL/min (ref >60)

## 2021-02-14 LAB — MRSA PCR SCREENING: MRSA by PCR: NEGATIVE

## 2021-02-14 LAB — MAGNESIUM: Magnesium: 2 mg/dL (ref 1.7–2.4)

## 2021-02-14 IMAGING — DX DG CHEST 1V PORT
1 series · 1 of 1 positions shown · non-contrast
Comparison: 08/04/2018, CT from 10/03/2011

CLINICAL DATA: Shortness of breath and cough

EXAM:
PORTABLE CHEST 1 VIEW

[chest ap]
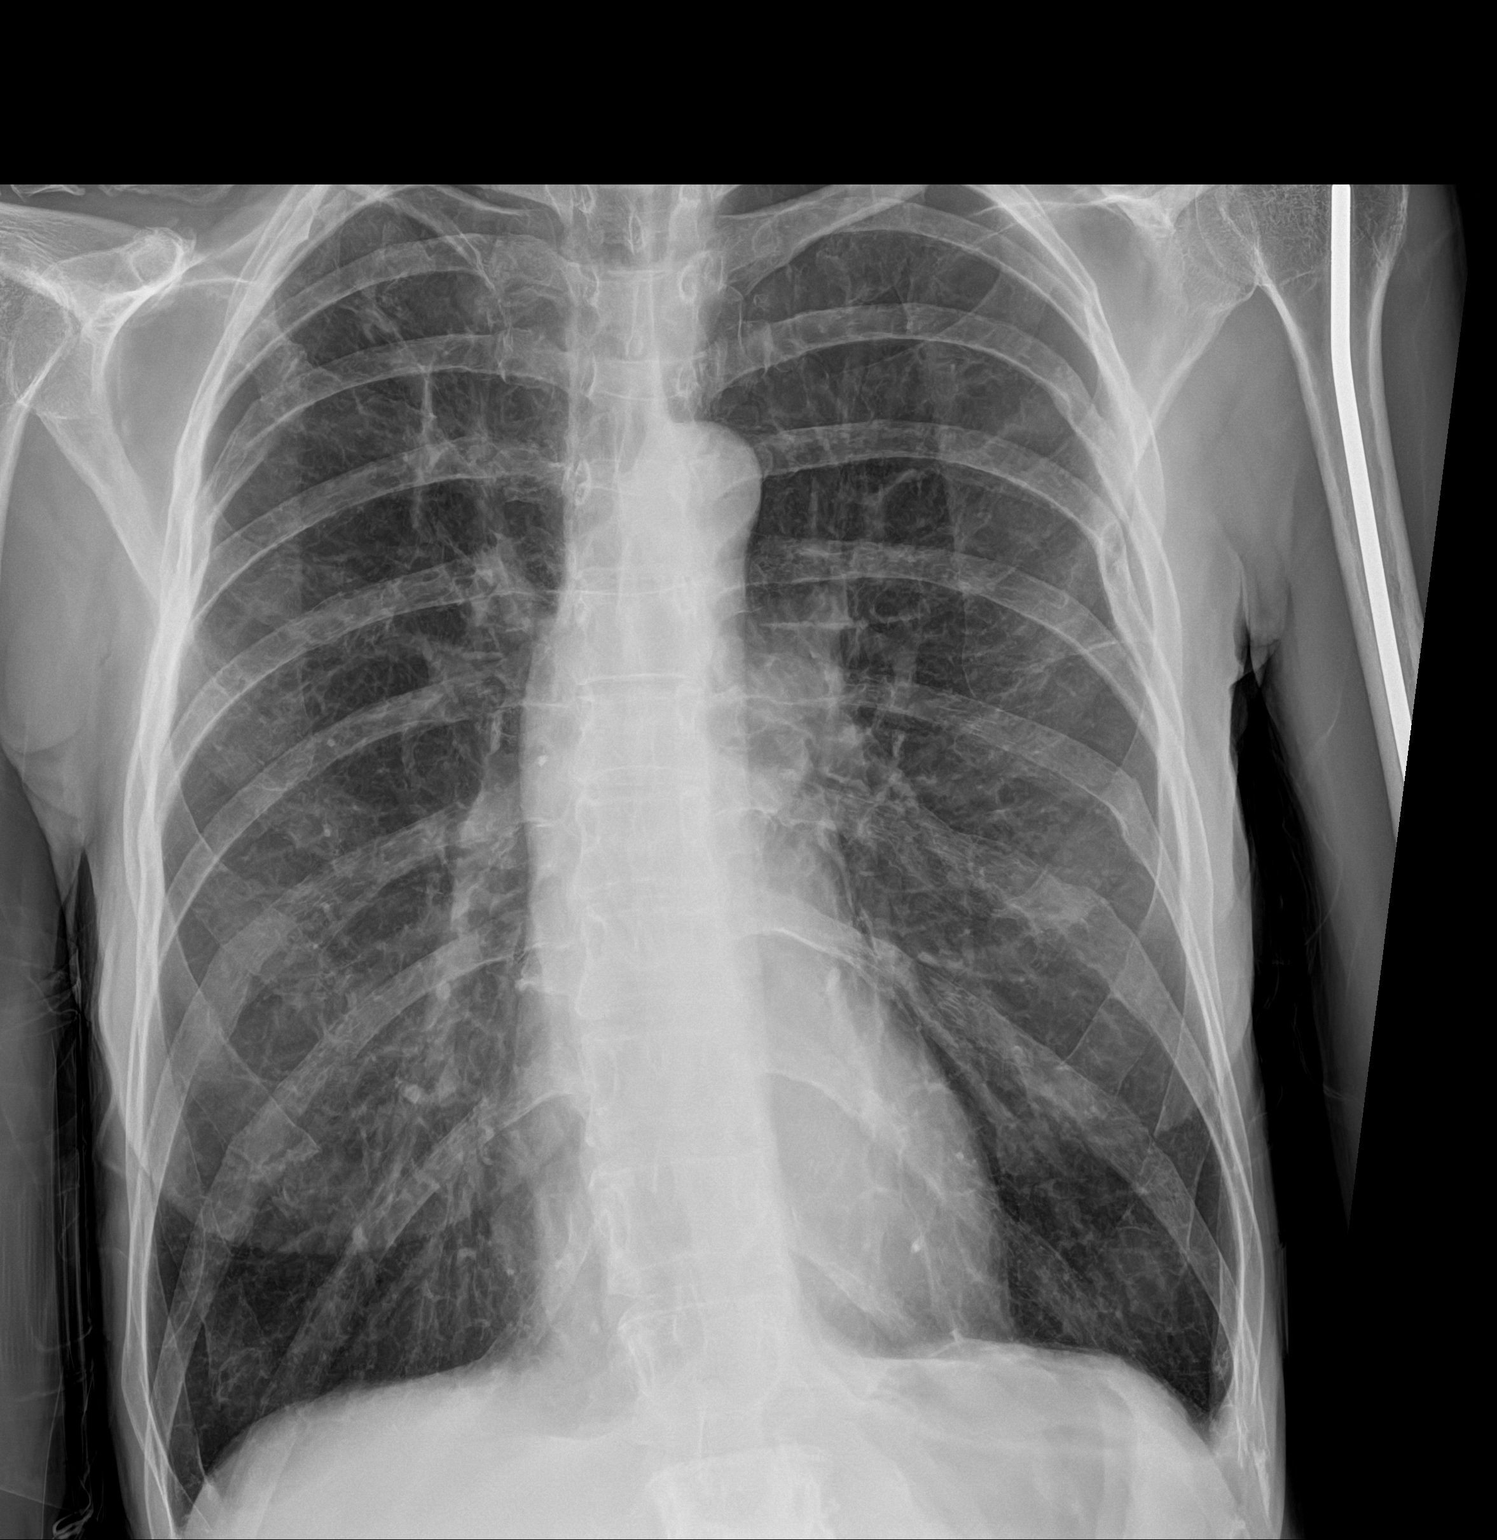

[1 of 1 positions shown; findings below may reference images not displayed]

FINDINGS: Cardiac shadow is within normal limits. The lungs are hyperinflated.
Focal cavitary lesion is again identified in the right upper lobe
similar to that seen on prior CT examination from 3733. Given this
long-term stability this is felt to be postinflammatory and benign
in etiology. No focal infiltrate or sizable effusion is seen.
Multiple old rib fractures are noted. These are stable from the
prior plain film examination.
IMPRESSION: COPD without acute abnormality. Previously seen cavitary lesion in
3733 is again noted and stable. No new focal abnormality is noted.

## 2021-02-14 SURGERY — OPEN REDUCTION INTERNAL FIXATION (ORIF) ANKLE FRACTURE
Anesthesia: General | Site: Knee | Laterality: Right

## 2021-02-14 MED ORDER — BUPIVACAINE HCL (PF) 0.25 % IJ SOLN
INTRAMUSCULAR | Status: AC
  Filled 2021-02-14: qty 60

## 2021-02-14 MED ORDER — CHLORHEXIDINE GLUCONATE 0.12 % MT SOLN
OROMUCOSAL | Status: AC
  Administered 2021-02-14: 15 mL
  Filled 2021-02-14: qty 15

## 2021-02-14 MED ORDER — VITAMIN D 25 MCG (1000 UNIT) PO TABS
5000.0000 [IU] | ORAL_TABLET | Freq: Every day | ORAL | Status: DC
  Administered 2021-02-15 – 2021-02-16 (×2): 5000 [IU] via ORAL
  Filled 2021-02-14 (×2): qty 5

## 2021-02-14 MED ORDER — SUGAMMADEX SODIUM 200 MG/2ML IV SOLN
INTRAVENOUS | Status: DC | PRN
  Administered 2021-02-14: 100 mg via INTRAVENOUS

## 2021-02-14 MED ORDER — FENTANYL CITRATE (PF) 250 MCG/5ML IJ SOLN
INTRAMUSCULAR | Status: DC | PRN
  Administered 2021-02-14 (×2): 50 ug via INTRAVENOUS

## 2021-02-14 MED ORDER — HYDROCODONE-ACETAMINOPHEN 7.5-325 MG PO TABS: 1.0000 | ORAL_TABLET | ORAL | Status: DC | PRN

## 2021-02-14 MED ORDER — CEFAZOLIN SODIUM-DEXTROSE 1-4 GM/50ML-% IV SOLN
1.0000 g | Freq: Four times a day (QID) | INTRAVENOUS | Status: AC
  Administered 2021-02-14 (×3): 1 g via INTRAVENOUS
  Filled 2021-02-14 (×3): qty 50

## 2021-02-14 MED ORDER — MAGNESIUM SULFATE 2 GM/50ML IV SOLN
2.0000 g | Freq: Once | INTRAVENOUS | Status: AC
  Filled 2021-02-14: qty 50

## 2021-02-14 MED ORDER — METOCLOPRAMIDE HCL 5 MG PO TABS: 5.0000 mg | ORAL_TABLET | Freq: Three times a day (TID) | ORAL | Status: DC | PRN

## 2021-02-14 MED ORDER — FENTANYL CITRATE (PF) 100 MCG/2ML IJ SOLN
INTRAMUSCULAR | Status: AC
  Administered 2021-02-14: 100 ug
  Filled 2021-02-14: qty 2

## 2021-02-14 MED ORDER — CEFAZOLIN SODIUM-DEXTROSE 2-4 GM/100ML-% IV SOLN
2.0000 g | INTRAVENOUS | Status: AC
  Administered 2021-02-14: 2 g via INTRAVENOUS
  Filled 2021-02-14: qty 100

## 2021-02-14 MED ORDER — ENOXAPARIN SODIUM 40 MG/0.4ML ~~LOC~~ SOLN
40.0000 mg | SUBCUTANEOUS | Status: DC
  Administered 2021-02-16: 40 mg via SUBCUTANEOUS
  Filled 2021-02-14: qty 0.4

## 2021-02-14 MED ORDER — ONDANSETRON HCL 4 MG/2ML IJ SOLN
INTRAMUSCULAR | Status: DC | PRN
  Administered 2021-02-14: 4 mg via INTRAVENOUS

## 2021-02-14 MED ORDER — LIDOCAINE 2% (20 MG/ML) 5 ML SYRINGE
INTRAMUSCULAR | Status: AC
  Filled 2021-02-14: qty 5

## 2021-02-14 MED ORDER — FENTANYL CITRATE (PF) 250 MCG/5ML IJ SOLN
INTRAMUSCULAR | Status: AC
  Filled 2021-02-14: qty 5

## 2021-02-14 MED ORDER — POLYETHYLENE GLYCOL 3350 17 G PO PACK
17.0000 g | PACK | Freq: Every day | ORAL | Status: DC | PRN
Start: 2021-02-14 — End: 2021-02-16

## 2021-02-14 MED ORDER — DEXAMETHASONE SODIUM PHOSPHATE 10 MG/ML IJ SOLN
INTRAMUSCULAR | Status: DC | PRN
  Administered 2021-02-14: 5 mg via INTRAVENOUS

## 2021-02-14 MED ORDER — VANCOMYCIN HCL 1000 MG IV SOLR
INTRAVENOUS | Status: AC
  Filled 2021-02-14: qty 1000

## 2021-02-14 MED ORDER — METHOCARBAMOL 1000 MG/10ML IJ SOLN
500.0000 mg | Freq: Four times a day (QID) | INTRAVENOUS | Status: DC | PRN
  Filled 2021-02-14: qty 5

## 2021-02-14 MED ORDER — ACETAMINOPHEN 500 MG PO TABS
500.0000 mg | ORAL_TABLET | Freq: Four times a day (QID) | ORAL | Status: AC
  Administered 2021-02-14 – 2021-02-15 (×4): 500 mg via ORAL
  Filled 2021-02-14 (×4): qty 1

## 2021-02-14 MED ORDER — PHENYLEPHRINE 40 MCG/ML (10ML) SYRINGE FOR IV PUSH (FOR BLOOD PRESSURE SUPPORT)
PREFILLED_SYRINGE | INTRAVENOUS | Status: DC | PRN
  Administered 2021-02-14 (×2): 80 ug via INTRAVENOUS
  Administered 2021-02-14: 40 ug via INTRAVENOUS

## 2021-02-14 MED ORDER — ROCURONIUM BROMIDE 10 MG/ML (PF) SYRINGE
PREFILLED_SYRINGE | INTRAVENOUS | Status: AC
  Filled 2021-02-14: qty 10

## 2021-02-14 MED ORDER — PANTOPRAZOLE SODIUM 40 MG PO TBEC
40.0000 mg | DELAYED_RELEASE_TABLET | Freq: Every day | ORAL | Status: DC
  Administered 2021-02-14 – 2021-02-16 (×3): 40 mg via ORAL
  Filled 2021-02-14 (×3): qty 1

## 2021-02-14 MED ORDER — 0.9 % SODIUM CHLORIDE (POUR BTL) OPTIME
TOPICAL | Status: DC | PRN
  Administered 2021-02-14: 1000 mL

## 2021-02-14 MED ORDER — ONDANSETRON HCL 4 MG PO TABS: 4.0000 mg | ORAL_TABLET | Freq: Four times a day (QID) | ORAL | Status: DC | PRN

## 2021-02-14 MED ORDER — PROPOFOL 10 MG/ML IV BOLUS
INTRAVENOUS | Status: DC | PRN
  Administered 2021-02-14: 100 mg via INTRAVENOUS

## 2021-02-14 MED ORDER — CHLORHEXIDINE GLUCONATE 4 % EX LIQD: 60.0000 mL | Freq: Once | CUTANEOUS | Status: DC

## 2021-02-14 MED ORDER — EPHEDRINE 5 MG/ML INJ
INTRAVENOUS | Status: AC
  Filled 2021-02-14: qty 10

## 2021-02-14 MED ORDER — DEXAMETHASONE SODIUM PHOSPHATE 4 MG/ML IJ SOLN
INTRAMUSCULAR | Status: DC | PRN
  Administered 2021-02-14: 2 mg via INTRAVENOUS
  Administered 2021-02-14: 3 mg via INTRAVENOUS

## 2021-02-14 MED ORDER — DEXAMETHASONE SODIUM PHOSPHATE 10 MG/ML IJ SOLN
INTRAMUSCULAR | Status: AC
  Filled 2021-02-14: qty 1

## 2021-02-14 MED ORDER — DIPHENHYDRAMINE HCL 12.5 MG/5ML PO ELIX: 12.5000 mg | ORAL_SOLUTION | ORAL | Status: DC | PRN

## 2021-02-14 MED ORDER — ACETAMINOPHEN 325 MG PO TABS: 325.0000 mg | ORAL_TABLET | Freq: Four times a day (QID) | ORAL | Status: DC | PRN

## 2021-02-14 MED ORDER — ONDANSETRON HCL 4 MG/2ML IJ SOLN
INTRAMUSCULAR | Status: AC
  Filled 2021-02-14: qty 2

## 2021-02-14 MED ORDER — PHENYLEPHRINE 40 MCG/ML (10ML) SYRINGE FOR IV PUSH (FOR BLOOD PRESSURE SUPPORT)
PREFILLED_SYRINGE | INTRAVENOUS | Status: AC
  Filled 2021-02-14: qty 10

## 2021-02-14 MED ORDER — ENSURE ENLIVE PO LIQD
237.0000 mL | Freq: Two times a day (BID) | ORAL | Status: DC
  Administered 2021-02-14: 237 mL via ORAL

## 2021-02-14 MED ORDER — CHLORHEXIDINE GLUCONATE 0.12 % MT SOLN: 15.0000 mL | Freq: Once | OROMUCOSAL | Status: AC

## 2021-02-14 MED ORDER — ORAL CARE MOUTH RINSE: 15.0000 mL | Freq: Once | OROMUCOSAL | Status: AC

## 2021-02-14 MED ORDER — PROPOFOL 10 MG/ML IV BOLUS
INTRAVENOUS | Status: AC
  Filled 2021-02-14: qty 20

## 2021-02-14 MED ORDER — DEXAMETHASONE SODIUM PHOSPHATE 10 MG/ML IJ SOLN: 8.0000 mg | Freq: Once | INTRAMUSCULAR | Status: DC

## 2021-02-14 MED ORDER — TRAMADOL HCL 50 MG PO TABS
50.0000 mg | ORAL_TABLET | Freq: Four times a day (QID) | ORAL | Status: DC
  Administered 2021-02-14 – 2021-02-15 (×4): 50 mg via ORAL
  Filled 2021-02-14 (×4): qty 1

## 2021-02-14 MED ORDER — TRANEXAMIC ACID-NACL 1000-0.7 MG/100ML-% IV SOLN
1000.0000 mg | INTRAVENOUS | Status: AC
  Administered 2021-02-14: 1000 mg via INTRAVENOUS
  Filled 2021-02-14: qty 100

## 2021-02-14 MED ORDER — ROCURONIUM BROMIDE 10 MG/ML (PF) SYRINGE
PREFILLED_SYRINGE | INTRAVENOUS | Status: DC | PRN
  Administered 2021-02-14: 20 mg via INTRAVENOUS
  Administered 2021-02-14: 40 mg via INTRAVENOUS

## 2021-02-14 MED ORDER — LIDOCAINE 2% (20 MG/ML) 5 ML SYRINGE
INTRAMUSCULAR | Status: DC | PRN
  Administered 2021-02-14: 45 mg via INTRAVENOUS

## 2021-02-14 MED ORDER — MAGNESIUM CITRATE PO SOLN: 1.0000 | Freq: Once | ORAL | Status: DC | PRN

## 2021-02-14 MED ORDER — BISACODYL 10 MG RE SUPP: 10.0000 mg | Freq: Every day | RECTAL | Status: DC | PRN

## 2021-02-14 MED ORDER — POVIDONE-IODINE 10 % EX SWAB
2.0000 "application " | Freq: Once | CUTANEOUS | Status: AC
  Administered 2021-02-14: 2 via TOPICAL

## 2021-02-14 MED ORDER — TRANEXAMIC ACID-NACL 1000-0.7 MG/100ML-% IV SOLN
1000.0000 mg | Freq: Once | INTRAVENOUS | Status: AC
  Administered 2021-02-14: 1000 mg via INTRAVENOUS
  Filled 2021-02-14: qty 100

## 2021-02-14 MED ORDER — METHOCARBAMOL 500 MG PO TABS
500.0000 mg | ORAL_TABLET | Freq: Four times a day (QID) | ORAL | Status: DC | PRN
  Administered 2021-02-14 – 2021-02-16 (×3): 500 mg via ORAL
  Filled 2021-02-14 (×3): qty 1

## 2021-02-14 MED ORDER — CLONIDINE HCL (ANALGESIA) 100 MCG/ML EP SOLN
EPIDURAL | Status: DC | PRN
  Administered 2021-02-14: 15 ug
  Administered 2021-02-14: 35 ug

## 2021-02-14 MED ORDER — ALBUTEROL SULFATE HFA 108 (90 BASE) MCG/ACT IN AERS
1.0000 | INHALATION_SPRAY | Freq: Four times a day (QID) | RESPIRATORY_TRACT | Status: DC | PRN
  Filled 2021-02-14: qty 6.7

## 2021-02-14 MED ORDER — HYDROCODONE-ACETAMINOPHEN 5-325 MG PO TABS
1.0000 | ORAL_TABLET | ORAL | Status: DC | PRN
  Administered 2021-02-14 – 2021-02-16 (×8): 2 via ORAL
  Filled 2021-02-14 (×9): qty 2

## 2021-02-14 MED ORDER — SUCCINYLCHOLINE CHLORIDE 200 MG/10ML IV SOSY
PREFILLED_SYRINGE | INTRAVENOUS | Status: AC
  Filled 2021-02-14: qty 10

## 2021-02-14 MED ORDER — METOCLOPRAMIDE HCL 5 MG/ML IJ SOLN: 5.0000 mg | Freq: Three times a day (TID) | INTRAMUSCULAR | Status: DC | PRN

## 2021-02-14 MED ORDER — MIDAZOLAM HCL 2 MG/2ML IJ SOLN
INTRAMUSCULAR | Status: AC
  Administered 2021-02-14: 2 mg
  Filled 2021-02-14: qty 2

## 2021-02-14 MED ORDER — BUPIVACAINE-EPINEPHRINE (PF) 0.5% -1:200000 IJ SOLN
INTRAMUSCULAR | Status: DC | PRN
  Administered 2021-02-14: 10 mL via PERINEURAL
  Administered 2021-02-14: 20 mL via PERINEURAL

## 2021-02-14 MED ORDER — LACTATED RINGERS IV SOLN: INTRAVENOUS | Status: DC

## 2021-02-14 MED ORDER — ACETAMINOPHEN 500 MG PO TABS
1000.0000 mg | ORAL_TABLET | Freq: Once | ORAL | Status: AC
  Administered 2021-02-14: 1000 mg via ORAL
  Filled 2021-02-14: qty 2

## 2021-02-14 MED ORDER — DOCUSATE SODIUM 100 MG PO CAPS
100.0000 mg | ORAL_CAPSULE | Freq: Two times a day (BID) | ORAL | Status: DC
  Administered 2021-02-14 – 2021-02-16 (×5): 100 mg via ORAL
  Filled 2021-02-14 (×5): qty 1

## 2021-02-14 MED ORDER — ONDANSETRON HCL 4 MG/2ML IJ SOLN: 4.0000 mg | Freq: Four times a day (QID) | INTRAMUSCULAR | Status: DC | PRN

## 2021-02-14 MED ORDER — MORPHINE SULFATE (PF) 2 MG/ML IV SOLN: 0.5000 mg | INTRAVENOUS | Status: DC | PRN

## 2021-02-14 SURGICAL SUPPLY — 89 items
BANDAGE ESMARK 6X9 LF (GAUZE/BANDAGES/DRESSINGS) ×2 IMPLANT
BIT DRILL 3.5X122MM AO FIT (BIT) ×1 IMPLANT
BIT DRILL LOCK 4.3 (BIT) ×1 IMPLANT
BIT DRILL NLOCK SHRT 3.2X216 (BIT) ×1 IMPLANT
BLADE CLIPPER SURG (BLADE) IMPLANT
BNDG COHESIVE 6X5 TAN STRL LF (GAUZE/BANDAGES/DRESSINGS) ×1 IMPLANT
BNDG ELASTIC 4X5.8 VLCR STR LF (GAUZE/BANDAGES/DRESSINGS) ×4 IMPLANT
BNDG ELASTIC 6X15 VLCR STRL LF (GAUZE/BANDAGES/DRESSINGS) ×1 IMPLANT
BNDG ELASTIC 6X5.8 VLCR STR LF (GAUZE/BANDAGES/DRESSINGS) ×2 IMPLANT
BNDG ESMARK 6X9 LF (GAUZE/BANDAGES/DRESSINGS) ×3
BNDG GAUZE ELAST 4 BULKY (GAUZE/BANDAGES/DRESSINGS) ×4 IMPLANT
COVER SURGICAL LIGHT HANDLE (MISCELLANEOUS) ×3 IMPLANT
DRAPE C-ARM 42X72 X-RAY (DRAPES) ×3 IMPLANT
DRAPE C-ARMOR (DRAPES) ×3 IMPLANT
DRAPE IMP U-DRAPE 54X76 (DRAPES) ×3 IMPLANT
DRAPE ORTHO SPLIT 77X108 STRL (DRAPES) ×2
DRAPE SURG ORHT 6 SPLT 77X108 (DRAPES) ×4 IMPLANT
DRAPE U-SHAPE 47X51 STRL (DRAPES) ×3 IMPLANT
DRILL 2.6X122MM WL AO SHAFT (BIT) ×1 IMPLANT
DRSG ADAPTIC 3X8 NADH LF (GAUZE/BANDAGES/DRESSINGS) ×3 IMPLANT
DRSG PAD ABDOMINAL 8X10 ST (GAUZE/BANDAGES/DRESSINGS) ×8 IMPLANT
DURAPREP 26ML APPLICATOR (WOUND CARE) ×3 IMPLANT
ELECT REM PT RETURN 9FT ADLT (ELECTROSURGICAL) ×3
ELECTRODE REM PT RTRN 9FT ADLT (ELECTROSURGICAL) ×2 IMPLANT
GAUZE SPONGE 4X4 12PLY STRL (GAUZE/BANDAGES/DRESSINGS) ×3 IMPLANT
GLOVE BIO SURGEON STRL SZ7.5 (GLOVE) ×7 IMPLANT
GLOVE BIOGEL PI IND STRL 7.5 (GLOVE) ×2 IMPLANT
GLOVE BIOGEL PI INDICATOR 7.5 (GLOVE) ×1
GLOVE SRG 8 PF TXTR STRL LF DI (GLOVE) ×2 IMPLANT
GLOVE SURG SYN 7.5  E (GLOVE) ×1
GLOVE SURG SYN 7.5 E (GLOVE) ×2 IMPLANT
GLOVE SURG SYN 7.5 PF PI (GLOVE) ×2 IMPLANT
GLOVE SURG UNDER POLY LF SZ8 (GLOVE) ×1
GOWN STRL REUS W/ TWL LRG LVL3 (GOWN DISPOSABLE) ×4 IMPLANT
GOWN STRL REUS W/ TWL XL LVL3 (GOWN DISPOSABLE) ×4 IMPLANT
GOWN STRL REUS W/TWL LRG LVL3 (GOWN DISPOSABLE) ×2
GOWN STRL REUS W/TWL XL LVL3 (GOWN DISPOSABLE) ×2
K-WIRE SMOOTH 2.0X150 (WIRE) ×6
KIT BASIN OR (CUSTOM PROCEDURE TRAY) ×3 IMPLANT
KIT TURNOVER KIT B (KITS) ×3 IMPLANT
KWIRE SMOOTH 2.0X150 (WIRE) IMPLANT
MANIFOLD NEPTUNE II (INSTRUMENTS) ×3 IMPLANT
NDL 18GX1X1/2 (RX/OR ONLY) (NEEDLE) IMPLANT
NEEDLE 18GX1X1/2 (RX/OR ONLY) (NEEDLE) ×3 IMPLANT
NEEDLE 22X1 1/2 (OR ONLY) (NEEDLE) ×3 IMPLANT
NS IRRIG 1000ML POUR BTL (IV SOLUTION) ×3 IMPLANT
PACK ORTHO EXTREMITY (CUSTOM PROCEDURE TRAY) ×3 IMPLANT
PACK TOTAL JOINT (CUSTOM PROCEDURE TRAY) ×3 IMPLANT
PACK UNIVERSAL I (CUSTOM PROCEDURE TRAY) ×3 IMPLANT
PAD ABD 8X10 STRL (GAUZE/BANDAGES/DRESSINGS) ×2 IMPLANT
PAD ARMBOARD 7.5X6 YLW CONV (MISCELLANEOUS) ×6 IMPLANT
PAD CAST 4YDX4 CTTN HI CHSV (CAST SUPPLIES) ×4 IMPLANT
PADDING CAST COTTON 4X4 STRL (CAST SUPPLIES)
PADDING CAST COTTON 6X4 STRL (CAST SUPPLIES) ×2 IMPLANT
PLATE FEMUR DISTAL 6H RIGHT (Plate) ×1 IMPLANT
PLATE TUBUAL 1/3 6H (Plate) ×1 IMPLANT
SCREW CANC 2.5XFT HEX12X4X (Screw) IMPLANT
SCREW CANCELLOUS 4.0X12MM (Screw) ×1 IMPLANT
SCREW CANCELLOUS 4.0X14 (Screw) ×1 IMPLANT
SCREW CANCELLOUS 6.0X70MM (Screw) ×2 IMPLANT
SCREW CORTEX ST MATTA 3.5X12MM (Screw) ×1 IMPLANT
SCREW CORTEX ST MATTA 3.5X14 (Screw) ×1 IMPLANT
SCREW CORTEX ST MATTA 3.5X16MM (Screw) ×1 IMPLANT
SCREW CORTEX ST MATTA 3.5X20 (Screw) ×1 IMPLANT
SCREW CORTICAL 4.5 X 70 (Screw) ×1 IMPLANT
SCREW CORTICAL 4.5X34MM (Screw) ×3 IMPLANT
SCREW CORTICAL 4.5X44MM (Screw) ×1 IMPLANT
SCREW LOCK 70X5XSTNS TI (Screw) IMPLANT
SCREW LOCKING 5.0 X 44 (Screw) ×1 IMPLANT
SCREW LOCKING 5.0X70MM (Screw) ×2 IMPLANT
SPONGE LAP 18X18 RF (DISPOSABLE) ×3 IMPLANT
STAPLER VISISTAT 35W (STAPLE) IMPLANT
STRIP CLOSURE SKIN 1/2X4 (GAUZE/BANDAGES/DRESSINGS) ×4 IMPLANT
SUCTION FRAZIER HANDLE 10FR (MISCELLANEOUS) ×1
SUCTION TUBE FRAZIER 10FR DISP (MISCELLANEOUS) ×2 IMPLANT
SUT ETHILON 3 0 PS 1 (SUTURE) ×2 IMPLANT
SUT MNCRL AB 4-0 PS2 18 (SUTURE) ×3 IMPLANT
SUT MON AB 2-0 CT1 27 (SUTURE) ×3 IMPLANT
SUT VIC AB 0 CT1 27 (SUTURE) ×1
SUT VIC AB 0 CT1 27XBRD ANBCTR (SUTURE) ×4 IMPLANT
SYR BULB IRRIG 60ML STRL (SYRINGE) ×3 IMPLANT
SYR CONTROL 10ML LL (SYRINGE) IMPLANT
TOWEL GREEN STERILE (TOWEL DISPOSABLE) ×6 IMPLANT
TOWEL GREEN STERILE FF (TOWEL DISPOSABLE) ×3 IMPLANT
TOWEL OR NON WOVEN STRL DISP B (DISPOSABLE) ×2 IMPLANT
TRAY FOLEY MTR SLVR 16FR STAT (SET/KITS/TRAYS/PACK) IMPLANT
TUBE CONNECTING 12X1/4 (SUCTIONS) ×3 IMPLANT
UNDERPAD 30X36 HEAVY ABSORB (UNDERPADS AND DIAPERS) ×6 IMPLANT
YANKAUER SUCT BULB TIP NO VENT (SUCTIONS) IMPLANT

## 2021-02-14 NOTE — Anesthesia Preprocedure Evaluation (Addendum)
Anesthesia Evaluation  Patient identified by MRN, date of birth, ID band Patient awake    Reviewed: Allergy & Precautions, NPO status , Patient's Chart, lab work & pertinent test results  History of Anesthesia Complications Negative for: history of anesthetic complications  Airway Mallampati: II  TM Distance: >3 FB Neck ROM: Full    Dental  (+) Edentulous Upper, Edentulous Lower, Dental Advisory Given   Pulmonary COPD,  COPD inhaler, Current Smoker and Patient abstained from smoking.,  Lung cancer   Pulmonary exam normal breath sounds clear to auscultation       Cardiovascular Exercise Tolerance: Good hypertension, Pt. on medications Normal cardiovascular exam Rhythm:Regular Rate:Normal     Neuro/Psych PSYCHIATRIC DISORDERS CVA    GI/Hepatic negative GI ROS, (+)     substance abuse (alccohol abuse, polysubstance use, last use of marijuana 08/24/20)  alcohol use and marijuana use,   Endo/Other  negative endocrine ROS  Renal/GU negative Renal ROS     Musculoskeletal Cervical vertebra fracture   Abdominal   Peds  Hematology negative hematology ROS (+)   Anesthesia Other Findings   Reproductive/Obstetrics negative OB ROS                            Anesthesia Physical Anesthesia Plan  ASA: III  Anesthesia Plan: General   Post-op Pain Management: GA combined w/ Regional for post-op pain   Induction: Intravenous  PONV Risk Score and Plan: 3 and Ondansetron, Dexamethasone, Treatment may vary due to age or medical condition and Midazolam  Airway Management Planned: Oral ETT  Additional Equipment:   Intra-op Plan:   Post-operative Plan: Extubation in OR  Informed Consent: I have reviewed the patients History and Physical, chart, labs and discussed the procedure including the risks, benefits and alternatives for the proposed anesthesia with the patient or authorized representative who  has indicated his/her understanding and acceptance.     Dental advisory given  Plan Discussed with: CRNA  Anesthesia Plan Comments:        Anesthesia Quick Evaluation

## 2021-02-14 NOTE — Anesthesia Procedure Notes (Signed)
Procedure Name: Intubation Date/Time: 02/14/2021 9:16 AM Performed by: Darletta Moll, CRNA Pre-anesthesia Checklist: Patient identified, Emergency Drugs available, Suction available and Patient being monitored Patient Re-evaluated:Patient Re-evaluated prior to induction Oxygen Delivery Method: Circle system utilized Preoxygenation: Pre-oxygenation with 100% oxygen Induction Type: IV induction Ventilation: Mask ventilation without difficulty Laryngoscope Size: Mac and 3 Grade View: Grade I Tube type: Oral Tube size: 7.5 mm Number of attempts: 1 Airway Equipment and Method: Stylet Placement Confirmation: ETT inserted through vocal cords under direct vision,  positive ETCO2 and breath sounds checked- equal and bilateral Secured at: 20 cm Tube secured with: Tape Dental Injury: Teeth and Oropharynx as per pre-operative assessment

## 2021-02-14 NOTE — Progress Notes (Signed)
Initial Nutrition Assessment  RD working remotely.  DOCUMENTATION CODES:   Underweight (suspect malnutrition)  INTERVENTION:   - Ensure Enlive po BID, each supplement provides 350 kcal and 20 grams of protein  - Continue MVI with minerals daily  NUTRITION DIAGNOSIS:   Increased nutrient needs related to hip fracture as evidenced by estimated needs.  GOAL:   Patient will meet greater than or equal to 90% of their needs  MONITOR:   PO intake,Supplement acceptance,Weight trends  REASON FOR ASSESSMENT:   Consult Hip fracture protocol  ASSESSMENT:   60 year old female who presented to the ED on 3/18 after a fall. PMH of EtOH abuse, intracranial hemorrhage, cervical cancer, COPD, HTN, unspecified lung infection, lung nodule, tobacco abuse. Pt found to have a right distal fibula fracture and right distal femur fracture.   Noted plan for ORIF today of right femur and right ankle.  RD attempted to reach pt via phone call to room to obtain diet and weight history; however, no answer. Will attempt to obtain history at follow-up.  Reviewed available weight history in chart. Pt's weight has fluctuated between 36-42 kg over the last year. Current weight of 89 lbs (40.4 kg) on admission appears to be stated rather than measured. Recommend obtaining measured admission weight.  Given BMI of 16.27, strongly suspect pt with some degree of malnutrition present. However, unable to confirm without history or NFPE.  Pt is currently NPO for surgery. One meal completion of 100% noted for lunch meal yesterday when pt was on a regular diet. RD will order oral nutrition supplements to aid pt in meeting kcal and protein needs and to promote healing.  Meal Completion: 100% x 1 meal on 3/19  Medications reviewed and include: colace, folic acid, MVI with minerals, thiamine, IV magnesium sulfate 2 grams once  Labs reviewed.  NUTRITION - FOCUSED PHYSICAL EXAM:  Unable to complete at this time. RD  working remotely.  Diet Order:   Diet Order            Diet NPO time specified  Diet effective midnight                 EDUCATION NEEDS:   Not appropriate for education at this time  Skin:  Skin Assessment: Reviewed RN Assessment  Last BM:  02/12/21  Height:   Ht Readings from Last 1 Encounters:  02/12/21 5\' 2"  (1.575 m)    Weight:   Wt Readings from Last 1 Encounters:  02/12/21 40.4 kg    BMI:  Body mass index is 16.28 kg/m.  Estimated Nutritional Needs:   Kcal:  1450-1650  Protein:  65-80 grams  Fluid:  1.4-1.6 L/day    Gustavus Bryant, MS, RD, LDN Inpatient Clinical Dietitian Please see [facility] for contact information.

## 2021-02-14 NOTE — Progress Notes (Signed)
PROGRESS NOTE    Beth [name]  RXV:[id number] DOB: 09-09-1961 DOA: 02/12/2021 PCP: Lucia Gaskins, MD    Chief Complaint  Patient presents with  . Ankle Pain    Brief Narrative:  Beth [name] is a 60 y.o. female with medical history significant of alcohol abuse, intracranial hemorrhage, cervical cancer, COPD, hypertension, unspecified lung infection, history of lung nodule, tobacco abuse who is coming to the emergency department after having a fall while walking her 2 dogs landing on her right side, injuring her right knee and right ankle.  She states this was an accident as she lost her balance while the dogs were pushing from the leashes  Subjective:  She is seen after return from the OR Pain currently is fairly controlled, reports feeling better  Assessment & Plan:   Principal Problem:   Closed displaced oblique fracture of shaft of right femur ([place]) Active Problems:   Constipation   Alcohol abuse   Hypertension   COPD (chronic obstructive pulmonary disease) (HCC)   Hypomagnesemia   Leukocytosis   Tobacco abuse   Displaced fracture of lateral malleolus of right fibula, initial encounter for closed fracture   Right femur fracture and right ankle fracture: S/p ORIF of ankle fracture, and ORIF of distal femur fracture by Dr. Percell Miller on 3/20. Check vitamin D level Postop weightbearing status, wound care, pain management and DVT prophylaxis per Ortho  Leukocytosis UA negative leukocyte negative nitrite Chest x-ray no acute infiltrate She does not appear to be septic Possibly from stress or dehydration, encourage oral intake Repeat CBC in the morning, monitor  Hypomagnesemia Mag 1.6, give IV mag Monitor  History of hypertension Blood pressure low normal, hold Norvasc  COPD, current smoker No wheezing Chest x-ray no acute findings Continue as needed inhaler Smoking cessation education provided, nicotine patch prescribed  Alcohol use Report no prior history  of alcohol withdrawal issues Monitor  Underweight: Body mass index is 16.28 kg/m..she reports weight has been stable Nutrition Status: Nutrition Problem: Increased nutrient needs Etiology: hip fracture Signs/Symptoms: estimated needs Interventions: MVI,Ensure Enlive (each supplement provides 350kcal and 20 grams of protein)  .     Unresulted Labs (From admission, onward)          Start     Ordered   02/21/21 0500  Creatinine, serum  (enoxaparin (LOVENOX)    CrCl >/= 30 ml/min)  Weekly,   R     Comments: while on enoxaparin therapy    02/14/21 1131   02/15/21 0500  CBC with Differential/Platelet  Tomorrow morning,   R        02/14/21 1636   02/15/21 5093  Basic metabolic panel  Tomorrow morning,   R        02/14/21 1636   02/15/21 0500  Magnesium  Tomorrow morning,   R        02/14/21 1636   02/15/21 0500  Hepatic function panel  Tomorrow morning,   R        02/14/21 1636   02/15/21 0500  TSH  Tomorrow morning,   R        02/14/21 1636   02/15/21 0500  VITAMIN D 25 Hydroxy (Vit-D Deficiency, Fractures)  Tomorrow morning,   R        02/14/21 1636            DVT prophylaxis: enoxaparin (LOVENOX) injection 40 mg Start: 02/16/21 0500 SCDs Start: 02/14/21 1132   Code Status: Full Family Communication: Patient Disposition:  Status is: Inpatient  Dispo: The patient is from: Home              Anticipated d/c is to: Pending PT eval              Anticipated d/c date is: 24 to 48 hours, need Ortho clearance                Consultants:   Orthopedic Dr. Percell Miller  Procedures:  OPEN REDUCTION INTERNAL FIXATION (ORIF) ANKLE FRACTURE, OPEN REDUCTION INTERNAL FIXATION (ORIF) DISTAL FEMUR FRACTURE on 3/20 by Dr. Percell Miller  Antimicrobials:   Perioperative  Anti-infectives (From admission, onward)   Start     Dose/Rate Route Frequency Ordered Stop   02/14/21 1145  ceFAZolin (ANCEF) IVPB 1 g/50 mL premix        1 g 100 mL/hr over 30 Minutes Intravenous Every 6 hours  02/14/21 1131 02/15/21 0544   02/14/21 1000  ceFAZolin (ANCEF) IVPB 2g/100 mL premix        2 g 200 mL/hr over 30 Minutes Intravenous On call to O.R. 02/14/21 0752 02/14/21 0938          Objective: Vitals:   02/14/21 1134 02/14/21 1230 02/14/21 1330 02/14/21 1430  BP: 126/79 107/71 101/66 105/70  Pulse: 72 75 75 78  Resp:  18 18 19   Temp: 97.7 F (36.5 C) 98.4 F (36.9 C) 97.9 F (36.6 C) 98.8 F (37.1 C)  TempSrc: Oral Oral Oral Oral  SpO2: 96% 95% 98% 96%  Weight:      Height:        Intake/Output Summary (Last 24 hours) at 02/14/2021 1636 Last data filed at 02/14/2021 1500 Gross per 24 hour  Intake 2220 ml  Output 5 ml  Net 2215 ml   Filed Weights   02/12/21 2036 02/14/21 0815  Weight: 40.4 kg 40.4 kg    Examination:  General exam: Thin ,calm, NAD Respiratory system: Clear to auscultation. Respiratory effort normal. Cardiovascular system: S1 & S2 heard, RRR.  Gastrointestinal system: Abdomen is nondistended, soft and nontender. Normal bowel sounds heard. Central nervous system: Alert and oriented. No focal neurological deficits. Extremities: Right hip and right ankle postop changes Skin: No rashes, lesions or ulcers Psychiatry: Judgement and insight appear normal. Mood & affect appropriate.     Data Reviewed: I have personally reviewed following labs and imaging studies  CBC: Recent Labs  Lab 02/12/21 2327 02/13/21 0708 02/14/21 0124 02/14/21 1232  WBC 12.8*  --  10.6* 13.8*  NEUTROABS 7.4  --  6.0  --   HGB 12.4 12.1 12.1 11.8*  HCT 38.4 35.6* 35.6* 34.7*  MCV 97.2  --  94.2 95.9  PLT 361  --  333 505    Basic Metabolic Panel: Recent Labs  Lab 02/12/21 2327 02/14/21 0124 02/14/21 1232  NA 135 137  --   K 3.6 3.9  --   CL 104 106  --   CO2 20* 23  --   GLUCOSE 90 114*  --   BUN 6 9  --   CREATININE 0.50 0.61 0.67  CALCIUM 9.1 9.1  --   MG 1.5* 2.0  --   PHOS 4.3  --   --     GFR: Estimated Creatinine Clearance: 48.9 mL/min (by  C-G formula based on SCr of 0.67 mg/dL).  Liver Function Tests: No results for input(s): AST, ALT, ALKPHOS, BILITOT, PROT, ALBUMIN in the last 168 hours.  CBG: No results for input(s): GLUCAP in the last  168 hours.   Recent Results (from the past 240 hour(s))  Resp Panel by RT-PCR (Flu A&B, Covid) Nasopharyngeal Swab     Status: None   Collection Time: 02/12/21 11:10 PM   Specimen: Nasopharyngeal Swab; Nasopharyngeal(NP) swabs in vial transport medium  Result Value Ref Range Status   SARS Coronavirus 2 by RT PCR NEGATIVE NEGATIVE Final    Comment: (NOTE) SARS-CoV-2 target nucleic acids are NOT DETECTED.  The SARS-CoV-2 RNA is generally detectable in upper respiratory specimens during the acute phase of infection. The lowest concentration of SARS-CoV-2 viral copies this assay can detect is 138 copies/mL. A negative result does not preclude SARS-Cov-2 infection and should not be used as the sole basis for treatment or other patient management decisions. A negative result may occur with  improper specimen collection/handling, submission of specimen other than nasopharyngeal swab, presence of viral mutation(s) within the areas targeted by this assay, and inadequate number of viral copies(<138 copies/mL). A negative result must be combined with clinical observations, patient history, and epidemiological information. The expected result is Negative.  Fact Sheet for Patients:  EntrepreneurPulse.com.au  Fact Sheet for Healthcare Providers:  IncredibleEmployment.be  This test is no t yet approved or cleared by the [place] FDA and  has been authorized for detection and/or diagnosis of SARS-CoV-2 by FDA under an Emergency Use Authorization (EUA). This EUA will remain  in effect (meaning this test can be used) for the duration of the COVID-19 declaration under Section 564(b)(1) of the Act, 21 U.S.C.section 360bbb-3(b)(1), unless the authorization  is terminated  or revoked sooner.       Influenza A by PCR NEGATIVE NEGATIVE Final   Influenza B by PCR NEGATIVE NEGATIVE Final    Comment: (NOTE) The Xpert Xpress SARS-CoV-2/FLU/RSV plus assay is intended as an aid in the diagnosis of influenza from Nasopharyngeal swab specimens and should not be used as a sole basis for treatment. Nasal washings and aspirates are unacceptable for Xpert Xpress SARS-CoV-2/FLU/RSV testing.  Fact Sheet for Patients: EntrepreneurPulse.com.au  Fact Sheet for Healthcare Providers: IncredibleEmployment.be  This test is not yet approved or cleared by the [place] FDA and has been authorized for detection and/or diagnosis of SARS-CoV-2 by FDA under an Emergency Use Authorization (EUA). This EUA will remain in effect (meaning this test can be used) for the duration of the COVID-19 declaration under Section 564(b)(1) of the Act, 21 U.S.C. section 360bbb-3(b)(1), unless the authorization is terminated or revoked.  Performed at [facility], [address] [place], [place] [address]   MRSA PCR Screening     Status: None   Collection Time: 02/14/21  6:29 AM   Specimen: Nasal Mucosa; Nasopharyngeal  Result Value Ref Range Status   MRSA by PCR NEGATIVE NEGATIVE Final    Comment:        The GeneXpert MRSA Assay (FDA approved for NASAL specimens only), is one component of a comprehensive MRSA colonization surveillance program. It is not intended to diagnose MRSA infection nor to guide or monitor treatment for MRSA infections. Performed at [facility], [place] [address] [place], [place] [address]          Radiology Studies: DG Knee 1-2 Views Right  Result Date: 02/14/2021 CLINICAL DATA:  Distal femur operative reduction and internal fixation EXAM: RIGHT KNEE - 1-2 VIEW COMPARISON:  CT of the knee from 02/12/2021 FINDINGS: Lateral plate and screw fixator of the distal femur noted traversing the oblique  fracture. The oblique fracture also has known extension  into the intercondylar notch, there are transverse screws in the FLAIR distal component of the plate which extend across this known fracture plane into the medial epicondylar region. Alignment is near anatomic. IMPRESSION: 1. Near anatomic alignment of the distal femoral fracture status post lateral plate and screw fixator placement. No complicating feature. Electronically Signed   By: Van Clines M.D.   On: 02/14/2021 15:17   DG Ankle 2 Views Right  Result Date: 02/14/2021 CLINICAL DATA:  Right ankle operative reduction and internal fixation EXAM: DG C-ARM 1-60 MIN; RIGHT ANKLE - 2 VIEW COMPARISON:  02/12/2021 FINDINGS: Lateral plate and screw fixation of the lateral malleolar oblique fracture noted along with a anterior to posterior oriented screw in the vicinity of the original fracture plane. Near anatomic alignment without complicating feature. IMPRESSION: 1. ORIF of lateral malleolar fracture. Electronically Signed   By: Van Clines M.D.   On: 02/14/2021 15:15   DG Ankle Complete Right  Result Date: 02/12/2021 CLINICAL DATA:  Fall EXAM: RIGHT ANKLE - COMPLETE 3+ VIEW COMPARISON:  None. FINDINGS: Nondisplaced distal fibular fracture noted at the level of the ankle mortise. No tibial abnormality. Joint space maintained. Lateral soft tissue swelling. IMPRESSION: Nondisplaced distal fibular fracture. Electronically Signed   By: Rolm Baptise M.D.   On: 02/12/2021 22:28   CT Head Wo Contrast  Result Date: 02/12/2021 CLINICAL DATA:  Fall EXAM: CT HEAD WITHOUT CONTRAST CT CERVICAL SPINE WITHOUT CONTRAST TECHNIQUE: Multidetector CT imaging of the head and cervical spine was performed following the standard protocol without intravenous contrast. Multiplanar CT image reconstructions of the cervical spine were also generated. COMPARISON:  CT head and cervical spine 10/09/2019 (report only, images unavailable) CT head and cervical spine  07/06/2019 FINDINGS: CT HEAD FINDINGS Brain: No evidence of acute infarction, hemorrhage, hydrocephalus, extra-axial collection, visible mass lesion or mass effect. Vascular: Atherosclerotic calcification of the carotid siphons. No hyperdense vessel. Skull: Chronic right supraorbital scarring. No acute soft tissue swelling, gas or laceration. No large scalp hematoma. No calvarial fracture or acute osseous injury within the included levels of imaging. Sinuses/Orbits: Chronic rightward anterior nasal septal deviation and in contacting left-sided nasal septal spur. Paranasal sinuses and mastoid air cells are predominantly clear. Middle ear cavities are clear. Included orbital structures are unremarkable. Other: None CT CERVICAL SPINE FINDINGS Alignment: Slight exaggeration of the upper cervical lordosis. Unchanged likely degenerative stepwise anterolisthesis C4-C7. Grossly unchanged alignment of a nonunited type 2 dens fracture and the asymmetric positioning of the lateral masses C1-C2. No acute traumatic listhesis is seen. Skull base and vertebrae: Redemonstration of the chronic, well corticated type 2 dens fracture with posterior translation and angulation. No acute cervical spine fracture is seen. No visible skull base fractures or other acute osseous abnormalities. No suspicious osseous lesions. Cervical spondylitic changes as below. Additional arthrosis about the atlantodental interval and basion dens intervals, unchanged from prior. Soft tissues and spinal canal: No pre or paravertebral fluid or swelling. No visible canal hematoma. Disc levels: Multilevel intervertebral disc height loss with spondylitic endplate changes. At most mild resulting canal stenosis C4-5, C5-6. Multilevel uncinate spurring and facet hypertrophic changes are present as well maximal C4-C7 where there is mild-to-moderate foraminal narrowing bilaterally. Upper chest: No acute abnormality in the upper chest or imaged lung apices. Stable  emphysematous changes. Other: No concerning thyroid nodule or mass. IMPRESSION: 1. No acute intracranial abnormality. 2. Grossly unchanged alignment of the chronic, well corticated type 2 dens fracture with posterior translation and angulation. 3. No acute cervical spine  fracture or traumatic listhesis. 4. Multilevel degenerative changes of the cervical spine as described above. Electronically Signed   By: Lovena Le M.D.   On: 02/12/2021 23:20   CT Cervical Spine Wo Contrast  Result Date: 02/12/2021 CLINICAL DATA:  Fall EXAM: CT HEAD WITHOUT CONTRAST CT CERVICAL SPINE WITHOUT CONTRAST TECHNIQUE: Multidetector CT imaging of the head and cervical spine was performed following the standard protocol without intravenous contrast. Multiplanar CT image reconstructions of the cervical spine were also generated. COMPARISON:  CT head and cervical spine 10/09/2019 (report only, images unavailable) CT head and cervical spine 07/06/2019 FINDINGS: CT HEAD FINDINGS Brain: No evidence of acute infarction, hemorrhage, hydrocephalus, extra-axial collection, visible mass lesion or mass effect. Vascular: Atherosclerotic calcification of the carotid siphons. No hyperdense vessel. Skull: Chronic right supraorbital scarring. No acute soft tissue swelling, gas or laceration. No large scalp hematoma. No calvarial fracture or acute osseous injury within the included levels of imaging. Sinuses/Orbits: Chronic rightward anterior nasal septal deviation and in contacting left-sided nasal septal spur. Paranasal sinuses and mastoid air cells are predominantly clear. Middle ear cavities are clear. Included orbital structures are unremarkable. Other: None CT CERVICAL SPINE FINDINGS Alignment: Slight exaggeration of the upper cervical lordosis. Unchanged likely degenerative stepwise anterolisthesis C4-C7. Grossly unchanged alignment of a nonunited type 2 dens fracture and the asymmetric positioning of the lateral masses C1-C2. No acute  traumatic listhesis is seen. Skull base and vertebrae: Redemonstration of the chronic, well corticated type 2 dens fracture with posterior translation and angulation. No acute cervical spine fracture is seen. No visible skull base fractures or other acute osseous abnormalities. No suspicious osseous lesions. Cervical spondylitic changes as below. Additional arthrosis about the atlantodental interval and basion dens intervals, unchanged from prior. Soft tissues and spinal canal: No pre or paravertebral fluid or swelling. No visible canal hematoma. Disc levels: Multilevel intervertebral disc height loss with spondylitic endplate changes. At most mild resulting canal stenosis C4-5, C5-6. Multilevel uncinate spurring and facet hypertrophic changes are present as well maximal C4-C7 where there is mild-to-moderate foraminal narrowing bilaterally. Upper chest: No acute abnormality in the upper chest or imaged lung apices. Stable emphysematous changes. Other: No concerning thyroid nodule or mass. IMPRESSION: 1. No acute intracranial abnormality. 2. Grossly unchanged alignment of the chronic, well corticated type 2 dens fracture with posterior translation and angulation. 3. No acute cervical spine fracture or traumatic listhesis. 4. Multilevel degenerative changes of the cervical spine as described above. Electronically Signed   By: Lovena Le M.D.   On: 02/12/2021 23:20   CT KNEE RIGHT WO CONTRAST  Result Date: 02/12/2021 CLINICAL DATA:  Knee fracture, trip and fall while walking dogs. EXAM: CT OF THE RIGHT KNEE WITHOUT CONTRAST TECHNIQUE: Multidetector CT imaging of the RIGHT knee was performed according to the standard protocol. Multiplanar CT image reconstructions were also generated. COMPARISON:  Radiograph earlier today FINDINGS: Bones/Joint/Cartilage Mildly displaced and comminuted distal femur fracture. Spiral component involves the distal femoral metadiaphysis posteriorly, and extends to the lateral femoral  cortex. There is mild impaction involving the posterolateral femoral condyle. There is intra-articular extension. Articular offset at the patellofemoral joint is 2-3 mm. Extension through the intercondylar notch posteriorly is nondisplaced. No acute fracture of the patella, tibia, or fibula. There is a large lipohemarthrosis, which extends into a Baker cyst. Ligaments Suboptimally assessed by CT. Posterior cruciate ligament fibers remain intact. There is some indistinctness of the anterior cruciate ligament. Muscles and Tendons There is moderate intramuscular edema involving the posterior thigh muscle  compartment. Mild intramuscular edema involving the posterior calf muscle compartment. Soft tissues Generalized soft tissue edema. IMPRESSION: 1. Mildly displaced and comminuted distal femur fracture with intra-articular extension. Fracture extends through the intercondylar notch. Articular offset at the patellofemoral joint is 2-3 mm. 2. Large lipohemarthrosis, which extends into a Baker cyst. Electronically Signed   By: Keith Rake M.D.   On: 02/12/2021 23:50   Chest Portable 1 View  Result Date: 02/13/2021 CLINICAL DATA:  60 year old female with preop chest radiograph. EXAM: PORTABLE CHEST 1 VIEW COMPARISON:  Chest radiograph dated 01/11/2021. FINDINGS: Fifty-one. No focal consolidation, pleural effusion, pneumothorax. The cardiac silhouette is within limits. Osteopenia with degenerative changes of the spine. Old bilateral rib fractures. No acute osseous pathology. IMPRESSION: No active cardiopulmonary disease. Electronically Signed   By: Anner Crete M.D.   On: 02/13/2021 01:35   DG Knee Complete 4 Views Right  Result Date: 02/12/2021 CLINICAL DATA:  Fall, pain EXAM: RIGHT KNEE - COMPLETE 4+ VIEW COMPARISON:  None FINDINGS: There is an oblique minimally displaced fracture through the distal fibular metaphysis. This extends to the region of the lateral condyle. Large joint effusion. No subluxation  or dislocation. IMPRESSION: Oblique distal femoral fracture.  Large joint effusion. Electronically Signed   By: Rolm Baptise M.D.   On: 02/12/2021 22:30   DG C-Arm 1-60 Min  Result Date: 02/14/2021 CLINICAL DATA:  Right ankle operative reduction and internal fixation EXAM: DG C-ARM 1-60 MIN; RIGHT ANKLE - 2 VIEW COMPARISON:  02/12/2021 FINDINGS: Lateral plate and screw fixation of the lateral malleolar oblique fracture noted along with a anterior to posterior oriented screw in the vicinity of the original fracture plane. Near anatomic alignment without complicating feature. IMPRESSION: 1. ORIF of lateral malleolar fracture. Electronically Signed   By: Van Clines M.D.   On: 02/14/2021 15:15        Scheduled Meds: . acetaminophen  500 mg Oral Q6H  . docusate sodium  100 mg Oral BID  . [START ON 02/16/2021] enoxaparin (LOVENOX) injection  40 mg Subcutaneous Q24H  . feeding supplement  237 mL Oral BID BM  . folic acid  1 mg Oral Daily  . LORazepam  0-4 mg Intravenous Q6H   Followed by  . [START ON 02/15/2021] LORazepam  0-4 mg Intravenous Q12H  . multivitamin with minerals  1 tablet Oral Daily  . nicotine  14 mg Transdermal Daily  . pantoprazole  40 mg Oral Daily  . thiamine  100 mg Oral Daily   Or  . thiamine  100 mg Intravenous Daily  . traMADol  50 mg Oral Q6H   Continuous Infusions: .  ceFAZolin (ANCEF) IV 1 g (02/14/21 1238)  . methocarbamol (ROBAXIN) IV       LOS: 2 days   Time spent: 63mins Greater than 50% of this time was spent in counseling, explanation of diagnosis, planning of further management, and coordination of care.   Voice Recognition Viviann Spare dictation system was used to create this note, attempts have been made to correct errors. Please contact the author with questions and/or clarifications.   Florencia Reasons, MD PhD FACP [facility]  Available via Epic secure chat 7am-7pm for nonurgent issues Please page for urgent issues To page the attending  provider between 7A-7P or the covering provider during after hours 7P-7A, please log into the web site [email or web address] and access using universal [place] password for that web site. If you do not have the password, please call the hospital operator.  02/14/2021, 4:36 PM

## 2021-02-14 NOTE — Interval H&P Note (Signed)
History and Physical Interval Note:  02/14/2021 7:24 AM  Beth [name]  has presented today for surgery, with the diagnosis of RIGHT ANKLE FRACTURE, RIGHT FEMUR FRACTURE.  The various methods of treatment have been discussed with the patient and family. After consideration of risks, benefits and other options for treatment, the patient has consented to  Procedure(s): OPEN REDUCTION INTERNAL FIXATION (ORIF) ANKLE FRACTURE (Right) OPEN REDUCTION INTERNAL FIXATION (ORIF) DISTAL FEMUR FRACTURE (Right) as a surgical intervention.  The patient's history has been reviewed, patient examined, no change in status, stable for surgery.  I have reviewed the patient's chart and labs.  Questions were answered to the patient's satisfaction.     [name]

## 2021-02-14 NOTE — Progress Notes (Signed)
Orthopedic Tech Progress Note Patient Details:  DORYCE MCGREGORY [birth year] [id number] OR RN called requesting a CAM WALKER BOOT. Dropped off to DESK Ortho Devices Type of Ortho Device: CAM walker Ortho Device/Splint Interventions: Ordered   Post Interventions Patient Tolerated: Other (comment) Instructions Provided: Other (comment)   Janit Pagan 02/14/2021, 9:17 AM

## 2021-02-14 NOTE — Anesthesia Procedure Notes (Signed)
Anesthesia Regional Block: Femoral nerve block   Pre-Anesthetic Checklist: ,, timeout performed, Correct Patient, Correct Site, Correct Laterality, Correct Procedure, Correct Position, site marked, Risks and benefits discussed,  Surgical consent,  Pre-op evaluation,  At surgeon's request and post-op pain management  Laterality: Right  Prep: chloraprep       Needles:  Injection technique: Single-shot  Needle Type: Stimulator Needle - 80     Needle Length: 9cm  Needle Gauge: 22   Needle insertion depth: 6 cm   Additional Needles:   Procedures:,,,, ultrasound used (permanent image in chart),,,,  Narrative:  Start time: 02/14/2021 8:15 AM End time: 02/14/2021 8:36 AM Injection made incrementally with aspirations every 5 mL.  Performed by: Personally  Anesthesiologist: Nolon Nations, MD  Additional Notes: BP cuff, EKG monitors applied. Sedation begun. Femoral artery palpated for location of nerve. After nerve location verified with U/S, anesthetic injected incrementally, slowly, and after negative aspirations under direct u/s guidance. Good perineural spread. Patient tolerated well.

## 2021-02-14 NOTE — Op Note (Addendum)
02/12/2021 - 02/14/2021  10:24 AM  PATIENT:  Beth [name]    PRE-OPERATIVE DIAGNOSIS:  RIGHT ANKLE FRACTURE, RIGHT FEMUR FRACTURE  POST-OPERATIVE DIAGNOSIS:  Same  PROCEDURE:  OPEN REDUCTION INTERNAL FIXATION (ORIF) ANKLE FRACTURE, OPEN REDUCTION INTERNAL FIXATION (ORIF) DISTAL FEMUR FRACTURE  SURGEON:  [name], MD  ASSISTANT: Aggie Moats, PA-C, he was present and scrubbed throughout the case, critical for completion in a timely fashion, and for retraction, instrumentation, and closure.   ANESTHESIA:   gen   PREOPERATIVE INDICATIONS:  Beth [name] is a  60 y.o. female with a diagnosis of RIGHT ANKLE FRACTURE, RIGHT FEMUR FRACTURE who failed conservative measures and elected for surgical management.    The risks benefits and alternatives were discussed with the patient preoperatively including but not limited to the risks of infection, bleeding, nerve injury, cardiopulmonary complications, the need for revision surgery, among others, and the patient was willing to proceed.  OPERATIVE IMPLANTS: [employer] Bullhead femur plate and ankle plate.   OPERATIVE FINDINGS: Unstable ankle fracture. Stable syndesmosis post op  BLOOD LOSS: 34HD  COMPLICATIONS: none  TOURNIQUET TIME: 53min  OPERATIVE PROCEDURE:  Patient was identified in the preoperative holding area and site was marked by me He was transported to the operating theater and placed on the table in supine position taking care to pad all bony prominences. After a preincinduction time out anesthesia was induced. The right lower extremity was prepped and draped in normal sterile fashion and a pre-incision timeout was performed. Beth [name] received ancef for preoperative antibiotics.   I started at her supracondylar intercondylar femur fracture and made a lateral incision.  I then dissected to her IT band and incised this longitudinally.  I was able to reduce the intercondylar and articular step-off with a Cobb I then pinned this in  the place and placed a lateral plate.  I fixed this proximally and distally and was happy with the purchase of all screws  I then took for x-rays of her knee and distal femur was happy with hardware reduction and*was reduction and hardware placement.  This was thoroughly irrigated I used an 18-gauge needle to decompress her hemarthrosis incision was then closed in layers. She was placed in a knee immobilizer.  Next I turned my attention to the ankle  I made a lateral incision of roughly 7 cm dissection was carried down sharply to the distal fibula and then spreading dissection was used proximally to protect the superficial peroneal nerve. I sharply incised the periosteum and took care to protect the peroneal tendons. I then debrided the fracture site and performed a reduction maneuver which was held in place with a clamp.   I placed a lag screw across the fracture  I then selected a 6-hole one third tubular plate and placed in a neutralization fashion care was taken distally so as not to penetrate the joint with the cancellus screws.  I then stressed the syndesmosis and it was stable  The wound was then thoroughly irrigated and closed using a 0 Vicryl and Nylon sutures. She was placed in a walking boot.   POST OPERATIVE PLAN: Non-weightbearing. DVT prophylaxis will consist of mobilization and chemical px

## 2021-02-14 NOTE — Transfer of Care (Signed)
Immediate Anesthesia Transfer of Care Note  Patient: MCKINSEY KEAGLE  Procedure(s) Performed: OPEN REDUCTION INTERNAL FIXATION (ORIF) ANKLE FRACTURE (Right Ankle) OPEN REDUCTION INTERNAL FIXATION (ORIF) DISTAL FEMUR FRACTURE (Right Knee)  Patient Location: PACU  Anesthesia Type:General and GA combined with regional for post-op pain  Level of Consciousness: drowsy and patient cooperative  Airway & Oxygen Therapy: Patient Spontanous Breathing  Post-op Assessment: Report given to RN, Post -op Vital signs reviewed and stable and Patient moving all extremities X 4  Post vital signs: Reviewed and stable  Last Vitals:  Vitals Value Taken Time  BP    Temp    Pulse    Resp    SpO2      Last Pain:  Vitals:   02/14/21 0234  TempSrc: Oral  PainSc:       Patients Stated Pain Goal: 3 (55/21/74 7159)  Complications: No complications documented.

## 2021-02-14 NOTE — Anesthesia Procedure Notes (Signed)
Anesthesia Regional Block: Popliteal block (Sciatic nerve block)   Pre-Anesthetic Checklist: ,, timeout performed, Correct Patient, Correct Site, Correct Laterality, Correct Procedure, Correct Position, site marked, Risks and benefits discussed,  Surgical consent,  Pre-op evaluation,  At surgeon's request and post-op pain management  Laterality: Right  Prep: chloraprep       Needles:  Injection technique: Single-shot  Needle Type: Stimiplex     Needle Length: 10cm  Needle Gauge: 21     Additional Needles:   Procedures:,,,, ultrasound used (permanent image in chart),,,,  Motor weakness within 5 minutes.  Narrative:  Start time: 02/14/2021 8:36 AM End time: 02/14/2021 8:44 AM Injection made incrementally with aspirations every 5 mL.  Performed by: Personally  Anesthesiologist: Nolon Nations, MD  Additional Notes: Nerve located and needle positioned with direct ultrasound guidance. Good perineural spread. Patient tolerated well.

## 2021-02-14 NOTE — Interval H&P Note (Signed)
History and Physical Interval Note:  02/14/2021 7:18 AM  Beth [name]  has presented today for surgery, with the diagnosis of RIGHT ANKLE FRACTURE, RIGHT FEMUR FRACTURE.  The various methods of treatment have been discussed with the patient and family. After consideration of risks, benefits and other options for treatment, the patient has consented to  Procedure(s): OPEN REDUCTION INTERNAL FIXATION (ORIF) ANKLE FRACTURE (Right) OPEN REDUCTION INTERNAL FIXATION (ORIF) DISTAL FEMUR FRACTURE (Right) as a surgical intervention.  The patient's history has been reviewed, patient examined, no change in status, stable for surgery.  I have reviewed the patient's chart and labs.  Questions were answered to the patient's satisfaction.     [name]

## 2021-02-15 ENCOUNTER — Encounter (HOSPITAL_COMMUNITY): Payer: Self-pay | Admitting: Orthopedic Surgery

## 2021-02-15 DIAGNOSIS — S72331A Displaced oblique fracture of shaft of right femur, initial encounter for closed fracture: Secondary | ICD-10-CM | POA: Diagnosis not present

## 2021-02-15 LAB — VITAMIN D 25 HYDROXY (VIT D DEFICIENCY, FRACTURES): Vit D, 25-Hydroxy: 45.07 ng/mL (ref 30–100)

## 2021-02-15 LAB — BASIC METABOLIC PANEL
Anion gap: 5 (ref 5–15)
BUN: 6 mg/dL (ref 6–20)
CO2: 24 mmol/L (ref 22–32)
Calcium: 8.4 mg/dL — ABNORMAL LOW (ref 8.9–10.3)
Chloride: 104 mmol/L (ref 98–111)
Creatinine, Ser: 0.54 mg/dL (ref 0.44–1.00)
GFR, Estimated: >60 mL/min (ref >60)
Glucose, Bld: 142 mg/dL — ABNORMAL HIGH (ref 70–99)
Potassium: 3.9 mmol/L (ref 3.5–5.1)
Sodium: 133 mmol/L — ABNORMAL LOW (ref 135–145)

## 2021-02-15 LAB — CBC WITH DIFFERENTIAL/PLATELET
Abs Immature Granulocytes: 0.09 10*3/uL — ABNORMAL HIGH (ref 0.00–0.07)
Basophils Absolute: 0 10*3/uL (ref 0.0–0.1)
Basophils Relative: 0 %
Eosinophils Absolute: 0 10*3/uL (ref 0.0–0.5)
Eosinophils Relative: 0 %
HCT: 31.6 % — ABNORMAL LOW (ref 36.0–46.0)
Hemoglobin: 10.7 g/dL — ABNORMAL LOW (ref 12.0–15.0)
Immature Granulocytes: 1 %
Lymphocytes Relative: 11 %
Lymphs Abs: 1.8 10*3/uL (ref 0.7–4.0)
MCH: 32.2 pg (ref 26.0–34.0)
MCHC: 33.9 g/dL (ref 30.0–36.0)
MCV: 95.2 fL (ref 80.0–100.0)
Monocytes Absolute: 1.6 10*3/uL — ABNORMAL HIGH (ref 0.1–1.0)
Monocytes Relative: 10 %
Neutro Abs: 12.6 10*3/uL — ABNORMAL HIGH (ref 1.7–7.7)
Neutrophils Relative %: 78 %
Platelets: 273 10*3/uL (ref 150–400)
RBC: 3.32 MIL/uL — ABNORMAL LOW (ref 3.87–5.11)
RDW: 12.8 % (ref 11.5–15.5)
WBC: 16.1 10*3/uL — ABNORMAL HIGH (ref 4.0–10.5)
nRBC: 0 % (ref 0.0–0.2)

## 2021-02-15 LAB — HEPATIC FUNCTION PANEL
ALT: 13 U/L (ref 0–44)
AST: 16 U/L (ref 15–41)
Albumin: 2.9 g/dL — ABNORMAL LOW (ref 3.5–5.0)
Alkaline Phosphatase: 37 U/L — ABNORMAL LOW (ref 38–126)
Bilirubin, Direct: <0.1 mg/dL (ref 0.0–0.2)
Total Bilirubin: 0.6 mg/dL (ref 0.3–1.2)
Total Protein: 5.6 g/dL — ABNORMAL LOW (ref 6.5–8.1)

## 2021-02-15 LAB — T4, FREE: Free T4: 1.07 ng/dL (ref 0.61–1.12)

## 2021-02-15 LAB — MAGNESIUM: Magnesium: 1.5 mg/dL — ABNORMAL LOW (ref 1.7–2.4)

## 2021-02-15 LAB — TSH: TSH: 0.318 u[IU]/mL — ABNORMAL LOW (ref 0.350–4.500)

## 2021-02-15 MED ORDER — MAGNESIUM SULFATE 2 GM/50ML IV SOLN
2.0000 g | Freq: Once | INTRAVENOUS | Status: AC
  Administered 2021-02-15: 2 g via INTRAVENOUS
  Filled 2021-02-15: qty 50

## 2021-02-15 MED ORDER — TRAMADOL HCL 50 MG PO TABS: 50.0000 mg | ORAL_TABLET | Freq: Four times a day (QID) | ORAL | Status: DC | PRN

## 2021-02-15 NOTE — Progress Notes (Signed)
PROGRESS NOTE    Beth [name]  GOT:[id number] DOB: 17-Jul-1961 DOA: 02/12/2021 PCP: Lucia Gaskins, MD   Chief Complain: Ankle pain  Brief Narrative: Patient is a 60 year old female with history of alcohol abuse, intracranial hemorrhage, cervical cancer, COPD, hypertension, tobacco abuse who presented to the emergency department after a fall while walking her 2 dogs.  After the fall, she was complaining of pain on the right knee and right ankle.  Imagings showed ankle fracture and distal femur fracture of the right.  Orthopedics consulted and she underwent ORIF.  PT/OT evaluation pending.  Assessment & Plan:   Principal Problem:   Closed displaced oblique fracture of shaft of right femur ([place]) Active Problems:   Constipation   Alcohol abuse   Hypertension   COPD (chronic obstructive pulmonary disease) (HCC)   Hypomagnesemia   Leukocytosis   Tobacco abuse   Displaced fracture of lateral malleolus of right fibula, initial encounter for closed fracture   Right femur fracture/right ankle fracture: Status post ORIF of right ankle fracture, ORIF of distal femur fracture by Dr. Percell Miller on 3/20.  PT/OT evaluation pending.  Continue supportive care, pain management, DVT prophylaxis.  Leukocytosis: Worsening.  UA was negative for nitrite.  She denies any dysuria.  Chest x-ray did not show any pneumonia.  Most likely reactive.  Continue to monitor.  She is afebrile.  Low suspicion for infectious etiology.  Hypomagnesemia: Being supplemented and monitored  Hypertension: Norvasc on hold.  Blood pressure stable  COPD: Currently smoker.  No wheezes.  Chest x-ray no sign pneumonia.  Continue as needed bronchodilators.  She was counseled for smoking cessation.  On nicotine patch.  Chronic alcohol abuse: On CIWA protocol.  Continue to monitor  Underweight: BMI of 16.2.    Nutrition Problem: Increased nutrient needs Etiology: hip fracture      DVT prophylaxis:Lovenox Code Status:  Full Family Communication:  Status is: Inpatient  Remains inpatient appropriate because:Inpatient level of care appropriate due to severity of illness   Dispo: The patient is from: Home              Anticipated d/c is to: SNF              Patient currently is not medically stable to d/c.   Difficult to place patient No     Consultants: Ortho  Procedures:ORIF  Antimicrobials:  Anti-infectives (From admission, onward)   Start     Dose/Rate Route Frequency Ordered Stop   02/14/21 1145  ceFAZolin (ANCEF) IVPB 1 g/50 mL premix        1 g 100 mL/hr over 30 Minutes Intravenous Every 6 hours 02/14/21 1131 02/15/21 0019   02/14/21 1000  ceFAZolin (ANCEF) IVPB 2g/100 mL premix        2 g 200 mL/hr over 30 Minutes Intravenous On call to O.R. 02/14/21 5974 02/14/21 1638      Subjective: Patient seen and examined the bedside this morning.  Hemodynamically stable.  Comfortable.  Pain well controlled.  She expressed her interest in going to home.  She was about to work with physical therapist.  Objective: Vitals:   02/14/21 1430 02/14/21 2247 02/15/21 0426 02/15/21 0841  BP: 105/70 107/67 (!) 108/18 97/66  Pulse: 78  70 66  Resp: 19  18 18   Temp: 98.8 F (37.1 C) 98.7 F (37.1 C) 98.6 F (37 C) 98 F (36.7 C)  TempSrc: Oral Oral Oral Oral  SpO2: 96%  97% 97%  Weight:  Height:        Intake/Output Summary (Last 24 hours) at 02/15/2021 0946 Last data filed at 02/15/2021 0900 Gross per 24 hour  Intake 2480 ml  Output 5 ml  Net 2475 ml   Filed Weights   02/12/21 2036 02/14/21 0815  Weight: 40.4 kg 40.4 kg    Examination:  General exam: Pleasant female, thin built Respiratory system: Bilateral equal air entry, normal vesicular breath sounds, no wheezes or crackles  Cardiovascular system: S1 & S2 heard, RRR. No JVD, murmurs, rubs, gallops or clicks. No pedal edema. Gastrointestinal system: Abdomen is nondistended, soft and nontender. No organomegaly or masses felt.  Normal bowel sounds heard. Central nervous system: Alert and oriented. No focal neurological deficits. Extremities: CAM walker boot on  right lower extremity  skin: No rashes, lesions or ulcers,no icterus ,no pallor   Data Reviewed: I have personally reviewed following labs and imaging studies  CBC: Recent Labs  Lab 02/12/21 2327 02/13/21 0708 02/14/21 0124 02/14/21 1232 02/15/21 0256  WBC 12.8*  --  10.6* 13.8* 16.1*  NEUTROABS 7.4  --  6.0  --  12.6*  HGB 12.4 12.1 12.1 11.8* 10.7*  HCT 38.4 35.6* 35.6* 34.7* 31.6*  MCV 97.2  --  94.2 95.9 95.2  PLT 361  --  333 303 536   Basic Metabolic Panel: Recent Labs  Lab 02/12/21 2327 02/14/21 0124 02/14/21 1232 02/15/21 0256  NA 135 137  --  133*  K 3.6 3.9  --  3.9  CL 104 106  --  104  CO2 20* 23  --  24  GLUCOSE 90 114*  --  142*  BUN 6 9  --  6  CREATININE 0.50 0.61 0.67 0.54  CALCIUM 9.1 9.1  --  8.4*  MG 1.5* 2.0  --  1.5*  PHOS 4.3  --   --   --    GFR: Estimated Creatinine Clearance: 48.9 mL/min (by C-G formula based on SCr of 0.54 mg/dL). Liver Function Tests: Recent Labs  Lab 02/15/21 0256  AST 16  ALT 13  ALKPHOS 37*  BILITOT 0.6  PROT 5.6*  ALBUMIN 2.9*   No results for input(s): LIPASE, AMYLASE in the last 168 hours. No results for input(s): AMMONIA in the last 168 hours. Coagulation Profile: No results for input(s): INR, PROTIME in the last 168 hours. Cardiac Enzymes: No results for input(s): CKTOTAL, CKMB, CKMBINDEX, TROPONINI in the last 168 hours. BNP (last 3 results) No results for input(s): PROBNP in the last 8760 hours. HbA1C: No results for input(s): HGBA1C in the last 72 hours. CBG: No results for input(s): GLUCAP in the last 168 hours. Lipid Profile: No results for input(s): CHOL, HDL, LDLCALC, TRIG, CHOLHDL, LDLDIRECT in the last 72 hours. Thyroid Function Tests: Recent Labs    02/15/21 0256  TSH 0.318*   Anemia Panel: No results for input(s): VITAMINB12, FOLATE, FERRITIN,  TIBC, IRON, RETICCTPCT in the last 72 hours. Sepsis Labs: No results for input(s): PROCALCITON, LATICACIDVEN in the last 168 hours.  Recent Results (from the past 240 hour(s))  Resp Panel by RT-PCR (Flu A&B, Covid) Nasopharyngeal Swab     Status: None   Collection Time: 02/12/21 11:10 PM   Specimen: Nasopharyngeal Swab; Nasopharyngeal(NP) swabs in vial transport medium  Result Value Ref Range Status   SARS Coronavirus 2 by RT PCR NEGATIVE NEGATIVE Final    Comment: (NOTE) SARS-CoV-2 target nucleic acids are NOT DETECTED.  The SARS-CoV-2 RNA is generally detectable in upper respiratory specimens  during the acute phase of infection. The lowest concentration of SARS-CoV-2 viral copies this assay can detect is 138 copies/mL. A negative result does not preclude SARS-Cov-2 infection and should not be used as the sole basis for treatment or other patient management decisions. A negative result may occur with  improper specimen collection/handling, submission of specimen other than nasopharyngeal swab, presence of viral mutation(s) within the areas targeted by this assay, and inadequate number of viral copies(<138 copies/mL). A negative result must be combined with clinical observations, patient history, and epidemiological information. The expected result is Negative.  Fact Sheet for Patients:  EntrepreneurPulse.com.au  Fact Sheet for Healthcare Providers:  IncredibleEmployment.be  This test is no t yet approved or cleared by the [place] FDA and  has been authorized for detection and/or diagnosis of SARS-CoV-2 by FDA under an Emergency Use Authorization (EUA). This EUA will remain  in effect (meaning this test can be used) for the duration of the COVID-19 declaration under Section 564(b)(1) of the Act, 21 U.S.C.section 360bbb-3(b)(1), unless the authorization is terminated  or revoked sooner.       Influenza A by PCR NEGATIVE NEGATIVE Final    Influenza B by PCR NEGATIVE NEGATIVE Final    Comment: (NOTE) The Xpert Xpress SARS-CoV-2/FLU/RSV plus assay is intended as an aid in the diagnosis of influenza from Nasopharyngeal swab specimens and should not be used as a sole basis for treatment. Nasal washings and aspirates are unacceptable for Xpert Xpress SARS-CoV-2/FLU/RSV testing.  Fact Sheet for Patients: EntrepreneurPulse.com.au  Fact Sheet for Healthcare Providers: IncredibleEmployment.be  This test is not yet approved or cleared by the [place] FDA and has been authorized for detection and/or diagnosis of SARS-CoV-2 by FDA under an Emergency Use Authorization (EUA). This EUA will remain in effect (meaning this test can be used) for the duration of the COVID-19 declaration under Section 564(b)(1) of the Act, 21 U.S.C. section 360bbb-3(b)(1), unless the authorization is terminated or revoked.  Performed at [facility], [address], [place], [place] [address]   MRSA PCR Screening     Status: None   Collection Time: 02/14/21  6:29 AM   Specimen: Nasal Mucosa; Nasopharyngeal  Result Value Ref Range Status   MRSA by PCR NEGATIVE NEGATIVE Final    Comment:        The GeneXpert MRSA Assay (FDA approved for NASAL specimens only), is one component of a comprehensive MRSA colonization surveillance program. It is not intended to diagnose MRSA infection nor to guide or monitor treatment for MRSA infections. Performed at [facility], [place] [address], [place], [place] [address]          Radiology Studies: DG Knee 1-2 Views Right  Result Date: 02/14/2021 CLINICAL DATA:  Distal femur operative reduction and internal fixation EXAM: RIGHT KNEE - 1-2 VIEW COMPARISON:  CT of the knee from 02/12/2021 FINDINGS: Lateral plate and screw fixator of the distal femur noted traversing the oblique fracture. The oblique fracture also has known extension into the intercondylar notch,  there are transverse screws in the FLAIR distal component of the plate which extend across this known fracture plane into the medial epicondylar region. Alignment is near anatomic. IMPRESSION: 1. Near anatomic alignment of the distal femoral fracture status post lateral plate and screw fixator placement. No complicating feature. Electronically Signed   By: Van Clines M.D.   On: 02/14/2021 15:17   DG Ankle 2 Views Right  Result Date: 02/14/2021 CLINICAL DATA:  Right ankle operative reduction  and internal fixation EXAM: DG C-ARM 1-60 MIN; RIGHT ANKLE - 2 VIEW COMPARISON:  02/12/2021 FINDINGS: Lateral plate and screw fixation of the lateral malleolar oblique fracture noted along with a anterior to posterior oriented screw in the vicinity of the original fracture plane. Near anatomic alignment without complicating feature. IMPRESSION: 1. ORIF of lateral malleolar fracture. Electronically Signed   By: Van Clines M.D.   On: 02/14/2021 15:15   DG C-Arm 1-60 Min  Result Date: 02/14/2021 CLINICAL DATA:  Right ankle operative reduction and internal fixation EXAM: DG C-ARM 1-60 MIN; RIGHT ANKLE - 2 VIEW COMPARISON:  02/12/2021 FINDINGS: Lateral plate and screw fixation of the lateral malleolar oblique fracture noted along with a anterior to posterior oriented screw in the vicinity of the original fracture plane. Near anatomic alignment without complicating feature. IMPRESSION: 1. ORIF of lateral malleolar fracture. Electronically Signed   By: Van Clines M.D.   On: 02/14/2021 15:15        Scheduled Meds: . cholecalciferol  5,000 Units Oral Daily  . docusate sodium  100 mg Oral BID  . [START ON 02/16/2021] enoxaparin (LOVENOX) injection  40 mg Subcutaneous Q24H  . feeding supplement  237 mL Oral BID BM  . folic acid  1 mg Oral Daily  . LORazepam  0-4 mg Intravenous Q12H  . multivitamin with minerals  1 tablet Oral Daily  . nicotine  14 mg Transdermal Daily  . pantoprazole  40 mg  Oral Daily  . thiamine  100 mg Oral Daily   Or  . thiamine  100 mg Intravenous Daily   Continuous Infusions: . magnesium sulfate bolus IVPB    . methocarbamol (ROBAXIN) IV       LOS: 3 days    Time spent:35 mins, More than 50% of that time was spent in counseling and/or coordination of care.      Shelly Coss, MD [facility] P3/21/2022, 9:46 AM

## 2021-02-15 NOTE — Progress Notes (Signed)
    Subjective: Patient reports pain as mild. Controlled with medicines. Slept well. Tolerating diet. Urinating. No CP, SOB. Hasn't seen PT/OT for mobilization yet.   Objective:   VITALS:   Vitals:   02/14/21 1430 02/14/21 2247 02/15/21 0426 02/15/21 0841  BP: 105/70 107/67 (!) 108/18 97/66  Pulse: 78  70 66  Resp: 19  18 18   Temp: 98.8 F (37.1 C) 98.7 F (37.1 C) 98.6 F (37 C) 98 F (36.7 C)  TempSrc: Oral Oral Oral Oral  SpO2: 96%  97% 97%  Weight:      Height:       CBC Latest Ref Rng & Units 02/15/2021 02/14/2021 02/14/2021  WBC 4.0 - 10.5 K/uL 16.1(H) 13.8(H) 10.6(H)  Hemoglobin 12.0 - 15.0 g/dL 10.7(L) 11.8(L) 12.1  Hematocrit 36.0 - 46.0 % 31.6(L) 34.7(L) 35.6(L)  Platelets 150 - 400 K/uL 273 303 333   BMP Latest Ref Rng & Units 02/15/2021 02/14/2021 02/14/2021  Glucose 70 - 99 mg/dL 142(H) - 114(H)  BUN 6 - 20 mg/dL 6 - 9  Creatinine 0.44 - 1.00 mg/dL 0.54 0.67 0.61  Sodium 135 - 145 mmol/L 133(L) - 137  Potassium 3.5 - 5.1 mmol/L 3.9 - 3.9  Chloride 98 - 111 mmol/L 104 - 106  CO2 22 - 32 mmol/L 24 - 23  Calcium 8.9 - 10.3 mg/dL 8.4(L) - 9.1   Intake/Output      03/20 0701 03/21 0700 03/21 0701 03/22 0700   P.O. 720 360   I.V. (mL/kg) 1300 (32.2)    IV Piggyback 300    Total Intake(mL/kg) 2320 (57.4) 360 (8.9)   Urine (mL/kg/hr) 0 (0)    Blood 5    Total Output 5    Net +2315 +360        Urine Occurrence 2 x 2 x      Physical Exam: General: NAD. Sitting up in bed eating breakfast.  Resp: No increased wob Cardio: regular rate and rhythm ABD soft Neurologically intact MSK Neurovascularly intact Sensation intact distally Intact pulses distally Dorsiflexion/Plantar flexion intact Incision: dressing C/D/I Knee immobilizer and CAM boot in place on RLE  Assessment: 1 Day Post-Op  S/P Procedure(s) (LRB): OPEN REDUCTION INTERNAL FIXATION (ORIF) ANKLE FRACTURE (Right) OPEN REDUCTION INTERNAL FIXATION (ORIF) DISTAL FEMUR FRACTURE (Right) by Dr.  Ernesta Amble. Percell Miller on 02/14/21  Principal Problem:   Closed displaced oblique fracture of shaft of right femur ([place]) Active Problems:   Constipation   Alcohol abuse   Hypertension   COPD (chronic obstructive pulmonary disease) (HCC)   Hypomagnesemia   Leukocytosis   Tobacco abuse   Displaced fracture of lateral malleolus of right fibula, initial encounter for closed fracture   Plan: Advance diet Up with therapy Incentive Spirometry Elevate and Apply ice  Weightbearing: TDWB RLE, no right knee ROM Insicional and dressing care: Dressings left intact until follow-up and Reinforce dressings as needed Orthopedic device(s): CAM boot and knee immobilizer Showering: Keep dressing dry VTE prophylaxis: Lovenox 40mg  qd for 30 days post op, SCDs, ambulation Pain control: Continue current regimen Follow - up plan: 7-10 days in the office  Contact information:  Edmonia Lynch MD, Aggie Moats PA-C  Dispo: TBD pending therapy evals   Alisa Graff Office [phone] 02/15/2021, 10:08 AM

## 2021-02-15 NOTE — Evaluation (Signed)
Physical Therapy Evaluation Patient Details Name: Beth [name] MRN: [id number] DOB: 05-Apr-1961 Today's Date: 02/15/2021   History of Present Illness  60 yo female admitted to [facility] on 3/18 with fall resulting in R ankle and knee pain. DG R ankle shows nondisplaced distal fibular fracture, DG R knee shows oblique distal femoral fracture with large joint effusion. s/p ORIF ankle fracture, ORIF femur fracture on 3/20. PMH includes COPD, hypertension, cervical cancer, R knee arthroscopy with meniscus repair 2021, lung cancer, alcohol abuse, polysubstance abuse, [place] 2012.  Clinical Impression  Pt presents with RLE pain, impaired mobility, and decreased activity tolerance vs baseline. Pt to benefit from acute PT to address deficits. Pt ambulated short room distance to reach recliner today, overall requiring supervision to min guard level of assist. Pt with good maintenance of mobility precautions, awaiting further clarification from PA on need for KI/CAM boot as well as WB precautions. Pt eager to d/c home with husband, PT to see pt tomorrow am for stair training. PT to progress mobility as tolerated, and will continue to follow acutely.      Follow Up Recommendations Follow surgeon's recommendation for DC plan and follow-up therapies;Supervision for mobility/OOB    Equipment Recommendations  None recommended by PT    Recommendations for Other Services       Precautions / Restrictions Precautions Precautions: Fall Required Braces or Orthoses: Knee Immobilizer - Right;Other Brace Knee Immobilizer - Right: On at all times (getting order clarified, PA messaged on 3/21 at 10:40) Other Brace: Maintain CAM walker boot Restrictions Weight Bearing Restrictions: Yes RLE Weight Bearing: Non weight bearing (vs TDWB, getting this clarified, PA messaged on 3/21 at 10:40)      Mobility  Bed Mobility Overal bed mobility: Needs Assistance Bed Mobility: Supine to Sit     Supine to sit: Supervision;HOB  elevated     General bed mobility comments: supervision for safety, increased time and effort especially with RLE lifting and translation to EOB.    Transfers Overall transfer level: Needs assistance Equipment used: Rolling walker (2 wheeled) Transfers: Sit to/from Stand Sit to Stand: Supervision         General transfer comment: supervision for safety, verbal cuing for hand placement when rising/sitting. STS x3, from EOB, recliner, and BSC.  Ambulation/Gait Ambulation/Gait assistance: Min guard Gait Distance (Feet): 5 Feet Assistive device: Rolling walker (2 wheeled) Gait Pattern/deviations: Step-to pattern;Antalgic;Trunk flexed Gait velocity: decr   General Gait Details: Min guard for safety, PT reinforcing NWB RLE but difficult for pt given CAM boot and KI on RLE, PT encouraging pt to keep RLE slightly anterior to BOS to minimize WB  Stairs            Wheelchair Mobility    Modified Rankin (Stroke Patients Only)       Balance Overall balance assessment: Needs assistance Sitting-balance support: No upper extremity supported;Feet supported Sitting balance-Leahy Scale: Good     Standing balance support: Bilateral upper extremity supported;During functional activity Standing balance-Leahy Scale: Poor Standing balance comment: reliant on external support                             Pertinent Vitals/Pain Pain Assessment: 0-10 Pain Score: 5  Pain Location: RLE Pain Descriptors / Indicators: Sore;Discomfort;Grimacing Pain Intervention(s): Limited activity within patient's tolerance;Monitored during session;Repositioned;Premedicated before session    Home Living Family/patient expects to be discharged to:: Private residence Living Arrangements: Spouse/significant other Available Help at Discharge: Family;Available 24  hours/day Type of Home: House Home Access: Stairs to enter Entrance Stairs-Rails: Left;Right;Can reach both Entrance Stairs-Number  of Steps: 2 Home Layout: One level Home Equipment: Walker - 2 wheels;Crutches;Bedside commode      Prior Function Level of Independence: Independent         Comments: pt does not drive, otherwise completely independent PTA     Hand Dominance   Dominant Hand: Right    Extremity/Trunk Assessment   Upper Extremity Assessment Upper Extremity Assessment: Defer to OT evaluation    Lower Extremity Assessment Lower Extremity Assessment: Overall WFL for tasks assessed;RLE deficits/detail RLE Deficits / Details: unable to wiggle toes, states "my leg feels asleep" (since surgery) RLE Sensation: decreased light touch    Cervical / Trunk Assessment Cervical / Trunk Assessment: Normal  Communication   Communication: No difficulties  Cognition Arousal/Alertness: Awake/alert Behavior During Therapy: WFL for tasks assessed/performed Overall Cognitive Status: Within Functional Limits for tasks assessed                                        General Comments      Exercises     Assessment/Plan    PT Assessment Patient needs continued PT services  PT Problem List Decreased strength;Decreased mobility;Decreased activity tolerance;Decreased balance;Decreased knowledge of use of DME;Pain;Decreased range of motion;Decreased safety awareness       PT Treatment Interventions DME instruction;Therapeutic activities;Gait training;Therapeutic exercise;Patient/family education;Balance training;Stair training;Functional mobility training;Neuromuscular re-education    PT Goals (Current goals can be found in the Care Plan section)  Acute Rehab PT Goals Patient Stated Goal: go home ASAP PT Goal Formulation: With patient Time For Goal Achievement: 03/01/21 Potential to Achieve Goals: Good    Frequency Min 3X/week   Barriers to discharge        Co-evaluation               AM-PAC PT "6 Clicks" Mobility  Outcome Measure Help needed turning from your back to your  side while in a flat bed without using bedrails?: None Help needed moving from lying on your back to sitting on the side of a flat bed without using bedrails?: None Help needed moving to and from a bed to a chair (including a wheelchair)?: A Little Help needed standing up from a chair using your arms (e.g., wheelchair or bedside chair)?: A Little Help needed to walk in hospital room?: A Little Help needed climbing 3-5 steps with a railing? : A Little 6 Click Score: 20    End of Session Equipment Utilized During Treatment: Other (comment);Right knee immobilizer (CAM walker boot) Activity Tolerance: Patient tolerated treatment well Patient left: in chair;with call bell/phone within reach;with chair alarm set Nurse Communication: Mobility status (NT) PT Visit Diagnosis: Other abnormalities of gait and mobility (R26.89);Difficulty in walking, not elsewhere classified (R26.2)    Time: 4196-2229 PT Time Calculation (min) (ACUTE ONLY): 27 min   Charges:   PT Evaluation $PT Eval Low Complexity: 1 Low PT Treatments $Therapeutic Activity: 8-22 mins      Stacie Glaze, PT Acute Rehabilitation Services Pager [phone]  Office [phone]  Roxine Caddy E Ruffin Pyo 02/15/2021, 10:56 AM

## 2021-02-15 NOTE — Plan of Care (Signed)

## 2021-02-15 NOTE — Evaluation (Signed)
Occupational Therapy Evaluation Patient Details Name: Beth [name] MRN: [id number] DOB: February 08, 1961 Today's Date: 02/15/2021    History of Present Illness 60 yo female admitted to [facility] on 3/18 with fall resulting in R ankle and knee pain. DG R ankle shows nondisplaced distal fibular fracture, DG R knee shows oblique distal femoral fracture with large joint effusion. s/p ORIF ankle fracture, ORIF femur fracture on 3/20. PMH includes COPD, hypertension, cervical cancer, R knee arthroscopy with meniscus repair 2021, lung cancer, alcohol abuse, polysubstance abuse, [place] 2012.   Clinical Impression   Pt presents with decline in function and safety with ADLs and ADL mobility with impaired balance and endurance. PTA, pt lived at home with her husband and was Ind with ADLs, mobility with no AD and home mgt. Pt currently requires min - min guard A with LB ADLs, min guard A - Sup with ADL mobility/transfers using RW. Pt able to maintain NWB R LE using RW. Pt would benefit from acute OT services to address impairments to maximize level of function and safety Currently awaiting clarification from PA or MD for R LE TWB vs NWB    Follow Up Recommendations  Follow surgeon's recommendation for DC plan and follow-up therapies;Supervision - Intermittent    Equipment Recommendations  Other (comment) (reacher, pt has all necessary DME at home)    Recommendations for Other Services       Precautions / Restrictions Precautions Precautions: Fall Required Braces or Orthoses: Knee Immobilizer - Right;Other Brace Knee Immobilizer - Right: On at all times Other Brace: Maintain CAM walker boot Restrictions Weight Bearing Restrictions: Yes RLE Weight Bearing: Non weight bearing Other Position/Activity Restrictions: awaiting clarification from PA or MD for TWB vs NWB      Mobility Bed Mobility Overal bed mobility: Needs Assistance Bed Mobility: Supine to Sit     Supine to sit: Supervision;HOB elevated      General bed mobility comments: pt in recliner upon arrival    Transfers Overall transfer level: Needs assistance Equipment used: Rolling walker (2 wheeled) Transfers: Sit to/from Stand Sit to Stand: Supervision         General transfer comment: supervision for safety    Balance Overall balance assessment: Needs assistance Sitting-balance support: No upper extremity supported;Feet supported Sitting balance-Leahy Scale: Good     Standing balance support: Bilateral upper extremity supported;During functional activity Standing balance-Leahy Scale: Poor Standing balance comment: reliant on external support                           ADL either performed or assessed with clinical judgement   ADL Overall ADL's : Needs assistance/impaired Eating/Feeding: Independent;Sitting   Grooming: Wash/dry hands;Wash/dry face;Min guard;Standing   Upper Body Bathing: Set up;Independent;Sitting   Lower Body Bathing: Minimal assistance;Min guard   Upper Body Dressing : Set up;Independent;Sitting   Lower Body Dressing: Minimal assistance;Min guard   Toilet Transfer: Min guard;Supervision/safety;Ambulation;RW;Stand-pivot;BSC   Toileting- Water quality scientist and Hygiene: Min guard;Sit to/from stand       Functional mobility during ADLs: Min guard;Supervision/safety;Rolling walker;Cueing for safety       Vision Baseline Vision/History: Wears glasses Wears Glasses: At all times Patient Visual Report: No change from baseline       Perception     Praxis      Pertinent Vitals/Pain Pain Assessment: 0-10 Pain Score: 5  Pain Location: RLE Pain Descriptors / Indicators: Sore;Discomfort;Grimacing Pain Intervention(s): Monitored during session;Repositioned;Premedicated before session     Hand Dominance  Right   Extremity/Trunk Assessment Upper Extremity Assessment Upper Extremity Assessment: Overall WFL for tasks assessed   Lower Extremity Assessment Lower Extremity  Assessment: Defer to PT evaluation RLE Deficits / Details: unable to wiggle toes, states "my leg feels asleep" (since surgery) RLE Sensation: decreased light touch   Cervical / Trunk Assessment Cervical / Trunk Assessment: Normal   Communication Communication Communication: No difficulties   Cognition Arousal/Alertness: Awake/alert Behavior During Therapy: WFL for tasks assessed/performed Overall Cognitive Status: Within Functional Limits for tasks assessed                                     General Comments       Exercises     Shoulder Instructions      Home Living Family/patient expects to be discharged to:: Private residence Living Arrangements: Spouse/significant other Available Help at Discharge: Family;Available 24 hours/day Type of Home: House Home Access: Stairs to enter [employer] of Steps: 2 Entrance Stairs-Rails: Left;Right;Can reach both Home Layout: One level     Bathroom Shower/Tub: Tub only   Biochemist, clinical: Standard     Home Equipment: Environmental consultant - 2 wheels;Crutches;Bedside commode          Prior Functioning/Environment Level of Independence: Independent        Comments: pt does not drive, otherwise completely independent with ADLs/selfcare, mobility, home mgt        OT Problem List: Impaired balance (sitting and/or standing);Pain;Decreased safety awareness;Decreased activity tolerance;Decreased knowledge of use of DME or AE      OT Treatment/Interventions: Self-care/ADL training;Patient/family education;Balance training;Therapeutic activities;DME and/or AE instruction    OT Goals(Current goals can be found in the care plan section) Acute Rehab OT Goals Patient Stated Goal: go home ASAP OT Goal Formulation: With patient Time For Goal Achievement: 03/01/21 Potential to Achieve Goals: Good ADL Goals Pt Will Perform Grooming: with supervision;with set-up;with modified independence;standing;with caregiver  independent in assisting Pt Will Perform Lower Body Bathing: with min guard assist;with supervision;with modified independence;with caregiver independent in assisting Pt Will Perform Lower Body Dressing: with min guard assist;with supervision;with modified independence;with caregiver independent in assisting Pt Will Transfer to Toilet: with supervision;with modified independence;ambulating;regular height toilet;bedside commode Pt Will Perform Toileting - Clothing Manipulation and hygiene: with supervision;with modified independence;sit to/from stand;with caregiver independent in assisting  OT Frequency: Min 2X/week   Barriers to D/C:            Co-evaluation              AM-PAC OT "6 Clicks" Daily Activity     Outcome Measure Help from another person eating meals?: None Help from another person taking care of personal grooming?: A Little Help from another person toileting, which includes using toliet, bedpan, or urinal?: A Little Help from another person bathing (including washing, rinsing, drying)?: A Little Help from another person to put on and taking off regular upper body clothing?: None Help from another person to put on and taking off regular lower body clothing?: A Little 6 Click Score: 20   End of Session Equipment Utilized During Treatment: Gait belt;Rolling walker;Other (comment) (BSC)  Activity Tolerance: Patient tolerated treatment well Patient left: in chair;with call bell/phone within reach;with chair alarm set                   Time: 6283-6629 OT Time Calculation (min): 25 min Charges:  OT General Charges $OT Visit: 1 Visit  OT Evaluation $OT Eval Low Complexity: 1 Low OT Treatments $Self Care/Home Management : 8-22 mins    Britt Bottom 02/15/2021, 1:51 PM

## 2021-02-15 NOTE — TOC CAGE-AID Note (Signed)
Transition of Care [facility]) - CAGE-AID Screening   Patient Details  Name: Beth [name] MRN: [id number] Date of Birth: Jan 02, 1961  Elvina Sidle, RN Trauma Response Nurse  02/15/2021, 7:06 PM   Clinical Narrative:  Pt states that she drinks a "6 Pack of beer every few days, not every day"   CAGE-AID Screening:    Have You Ever Felt You Ought to Cut Down on Your Drinking or Drug Use?: No Have People Annoyed You By Critizing Your Drinking Or Drug Use?: Yes (husband) Have You Felt Bad Or Guilty About Your Drinking Or Drug Use?: No Have You Ever Had a Drink or Used Drugs First Thing In The Morning to Steady Your Nerves or to Get Rid of a Hangover?: No CAGE-AID Score: 1  Substance Abuse Education Offered: Yes (offered, but pt declined)

## 2021-02-16 DIAGNOSIS — S72331A Displaced oblique fracture of shaft of right femur, initial encounter for closed fracture: Secondary | ICD-10-CM | POA: Diagnosis not present

## 2021-02-16 LAB — CBC WITH DIFFERENTIAL/PLATELET
Abs Immature Granulocytes: 0.06 K/uL (ref 0.00–0.07)
Basophils Absolute: 0 K/uL (ref 0.0–0.1)
Basophils Relative: 0 %
Eosinophils Absolute: 0.1 K/uL (ref 0.0–0.5)
Eosinophils Relative: 1 %
HCT: 30.3 % — ABNORMAL LOW (ref 36.0–46.0)
Hemoglobin: 10.5 g/dL — ABNORMAL LOW (ref 12.0–15.0)
Immature Granulocytes: 1 %
Lymphocytes Relative: 32 %
Lymphs Abs: 3.6 K/uL (ref 0.7–4.0)
MCH: 33 pg (ref 26.0–34.0)
MCHC: 34.7 g/dL (ref 30.0–36.0)
MCV: 95.3 fL (ref 80.0–100.0)
Monocytes Absolute: 1.4 K/uL — ABNORMAL HIGH (ref 0.1–1.0)
Monocytes Relative: 12 %
Neutro Abs: 6 K/uL (ref 1.7–7.7)
Neutrophils Relative %: 54 %
Platelets: 309 K/uL (ref 150–400)
RBC: 3.18 MIL/uL — ABNORMAL LOW (ref 3.87–5.11)
RDW: 13.1 % (ref 11.5–15.5)
WBC: 11.2 K/uL — ABNORMAL HIGH (ref 4.0–10.5)
nRBC: 0 % (ref 0.0–0.2)

## 2021-02-16 LAB — T3, FREE: T3, Free: 1.2 pg/mL — ABNORMAL LOW (ref 2.0–4.4)

## 2021-02-16 LAB — MAGNESIUM: Magnesium: 1.6 mg/dL — ABNORMAL LOW (ref 1.7–2.4)

## 2021-02-16 MED ORDER — MAGNESIUM SULFATE 2 GM/50ML IV SOLN
2.0000 g | Freq: Once | INTRAVENOUS | Status: AC
  Administered 2021-02-16: 2 g via INTRAVENOUS
  Filled 2021-02-16: qty 50

## 2021-02-16 MED ORDER — MAGNESIUM OXIDE 400 (241.3 MG) MG PO TABS: 400.0000 mg | ORAL_TABLET | Freq: Every day | ORAL | Status: DC

## 2021-02-16 MED ORDER — THIAMINE HCL 100 MG PO TABS: 100.0000 mg | ORAL_TABLET | Freq: Every day | ORAL | 1 refills | Status: DC

## 2021-02-16 MED ORDER — POLYETHYLENE GLYCOL 3350 17 G PO PACK: 17.0000 g | PACK | Freq: Every day | ORAL | 0 refills | Status: DC | PRN

## 2021-02-16 MED ORDER — ASPIRIN EC 81 MG PO TBEC: 81.0000 mg | DELAYED_RELEASE_TABLET | Freq: Two times a day (BID) | ORAL | 0 refills | Status: AC

## 2021-02-16 MED ORDER — OXYCODONE HCL 5 MG PO CAPS: 5.0000 mg | ORAL_CAPSULE | Freq: Four times a day (QID) | ORAL | 0 refills | Status: DC | PRN

## 2021-02-16 MED ORDER — MAGNESIUM OXIDE 400 (241.3 MG) MG PO TABS: 400.0000 mg | ORAL_TABLET | Freq: Every day | ORAL | 1 refills | Status: DC

## 2021-02-16 MED ORDER — FOLIC ACID 1 MG PO TABS: 1.0000 mg | ORAL_TABLET | Freq: Every day | ORAL | 1 refills | Status: DC

## 2021-02-16 MED ORDER — NICOTINE 14 MG/24HR TD PT24: 14.0000 mg | MEDICATED_PATCH | Freq: Every day | TRANSDERMAL | 0 refills | Status: DC

## 2021-02-16 NOTE — TOC Transition Note (Signed)
Transition of Care [facility]) - CM/SW Discharge Note   Patient Details  Name: Beth [name] MRN: [id number] Date of Birth: October 16, 1961  Transition of Care [facility]) CM/SW Contact:  Sharin Mons, RN Phone Number: 02/16/2021, 3:55 PM   Clinical Narrative:    Patient will DC KP:[id number] Anticipated DC date: 02/16/2021 Family notified: yes Transport by: [employer]  Pt admitted with Right femur fracture/right ankle fracture after falling, hx of alcohol abuse, intracranial hemorrhage, cervical cancer, COPD, hypertension, tobacco abuse. From home with husband. States PTA independent with ADL's ,no DME usage.       - s/p ORIF to R ankle fx and  R femur fx, 3/20  Per MD patient ready for DC today. RN, patient, patient's family notified of DC. Home heal  [name] (Niece)  [name] ( pt)   [phone]  [phone]   Home health order noted for home health PT/OT  services. NCM has called several [place] agencies ( [facility], [facility], [facility], [place], [facility], [facility], [facility]), all unable to provide services.... TOC team will continue to search. Pt without  DME needs, already has rolling walker and 3 in 1/bsc. Pt without transportation to home. [facility] transportation services called and transportation arranged. Waiver signed by pt and place in chart by nurse.   RNCM will sign off for now as intervention is no longer needed. Please consult [place] again if new needs arise.    Final next level of care: Home w Home Health Services Barriers to Discharge: No Barriers Identified   Patient Goals and CMS Choice     Choice offered to / list presented to : Patient  Discharge Placement                       Discharge Plan and Services                DME Arranged: N/A DME Agency: NA       HH Arranged: PT,OT          Social Determinants of Health (SDOH) Interventions     Readmission Risk Interventions No flowsheet data found.

## 2021-02-16 NOTE — Progress Notes (Signed)
Physical Therapy Treatment Patient Details Name: Beth [name] MRN: [id number] DOB: [birth year] Today's Date: 02/16/2021    History of Present Illness 60 yo female admitted to [facility] on 3/18 with fall resulting in R ankle and knee pain. DG R ankle shows nondisplaced distal fibular fracture, DG R knee shows oblique distal femoral fracture with large joint effusion. s/p ORIF ankle fracture, ORIF femur fracture on 3/20. PMH includes COPD, hypertension, cervical cancer, R knee arthroscopy with meniscus repair 2021, lung cancer, alcohol abuse, polysubstance abuse, [place] 2012.    PT Comments    PT confirmed with PA and Dr. Percell Miller that pt must maintain KI and CAM walker boot at all times, and TDWB is appropriate for mobility. Pt ambulatory in hallway today, overall requiring min guard to supervision for safety during. Pt proficiently navigated steps with sequencing cues from PT, pt states her husband will be able to help her with steps and mobility at d/c. Pt is appropriate to d/c home from PT standpoint.    Follow Up Recommendations  Follow surgeon's recommendation for DC plan and follow-up therapies;Supervision for mobility/OOB     Equipment Recommendations  None recommended by PT    Recommendations for Other Services       Precautions / Restrictions Precautions Precautions: Fall Required Braces or Orthoses: Knee Immobilizer - Right;Other Brace Knee Immobilizer - Right: On at all times Other Brace: Maintain CAM walker boot, no ROM of knee permitted Restrictions Weight Bearing Restrictions: Yes RLE Weight Bearing: Touchdown weight bearing (confirmed via secure chat with PA)    Mobility  Bed Mobility Overal bed mobility: Needs Assistance Bed Mobility: Supine to Sit     Supine to sit: Supervision;HOB elevated     General bed mobility comments: supervision for safety, increased time and effort especially with RLE lifting and translation to EOB.    Transfers Overall transfer level:  Needs assistance Equipment used: Rolling walker (2 wheeled) Transfers: Sit to/from Stand Sit to Stand: Supervision         General transfer comment: supervision for safety, cuing for hand placement when rising/sitting x2 during session.  Ambulation/Gait Ambulation/Gait assistance: Supervision Gait Distance (Feet): 100 Feet (x2 - seated rest) Assistive device: Rolling walker (2 wheeled) Gait Pattern/deviations: Step-to pattern;Antalgic;Trunk flexed Gait velocity: decr   General Gait Details: supervision for safety, verbal cuing for sequencing (RW, RLE touch down only, followed by LLE), upright posture. Seated rest after ambulation to gym, x2 minutes. Additional cues to ensure TDWB RLE.   Stairs Stairs: Yes Stairs assistance: Min guard Stair Management: Two rails;Step to pattern;Forwards Number of Stairs: 2 General stair comments: min guard for safety, verbal cuing for sequencing (LLE leading with ascent, RLE leading with descent) and using railings to maintain TDWB RLE.   Wheelchair Mobility    Modified Rankin (Stroke Patients Only)       Balance Overall balance assessment: Needs assistance Sitting-balance support: No upper extremity supported;Feet supported Sitting balance-Leahy Scale: Good     Standing balance support: Bilateral upper extremity supported;During functional activity Standing balance-Leahy Scale: Poor Standing balance comment: reliant on external support                            Cognition Arousal/Alertness: Awake/alert Behavior During Therapy: WFL for tasks assessed/performed Overall Cognitive Status: Within Functional Limits for tasks assessed  Exercises      General Comments        Pertinent Vitals/Pain Pain Assessment: Faces Faces Pain Scale: Hurts a little bit Pain Location: RLE Pain Descriptors / Indicators: Sore;Discomfort;Grimacing Pain Intervention(s): Limited  activity within patient's tolerance;Monitored during session;Repositioned    Home Living                      Prior Function            PT Goals (current goals can now be found in the care plan section) Acute Rehab PT Goals Patient Stated Goal: go home ASAP PT Goal Formulation: With patient Time For Goal Achievement: 03/01/21 Potential to Achieve Goals: Good Progress towards PT goals: Progressing toward goals    Frequency    Min 3X/week      PT Plan Current plan remains appropriate    Co-evaluation              AM-PAC PT "6 Clicks" Mobility   Outcome Measure  Help needed turning from your back to your side while in a flat bed without using bedrails?: None Help needed moving from lying on your back to sitting on the side of a flat bed without using bedrails?: None Help needed moving to and from a bed to a chair (including a wheelchair)?: A Little Help needed standing up from a chair using your arms (e.g., wheelchair or bedside chair)?: A Little Help needed to walk in hospital room?: A Little Help needed climbing 3-5 steps with a railing? : A Little 6 Click Score: 20    End of Session Equipment Utilized During Treatment: Other (comment);Right knee immobilizer (CAM walker boot) Activity Tolerance: Patient tolerated treatment well Patient left: in chair;with call bell/phone within reach;with chair alarm set Nurse Communication: Mobility status;Weight bearing status PT Visit Diagnosis: Other abnormalities of gait and mobility (R26.89);Difficulty in walking, not elsewhere classified (R26.2)     Time: 6333-5456 PT Time Calculation (min) (ACUTE ONLY): 22 min  Charges:  $Gait Training: 8-22 mins                     Stacie Glaze, PT Acute Rehabilitation Services Pager [phone]  Office [phone]    [place] 02/16/2021, 11:19 AM

## 2021-02-16 NOTE — Plan of Care (Signed)

## 2021-02-16 NOTE — Anesthesia Postprocedure Evaluation (Signed)
Anesthesia Post Note  Patient: Beth [name]  Procedure(s) Performed: OPEN REDUCTION INTERNAL FIXATION (ORIF) ANKLE FRACTURE (Right Ankle) OPEN REDUCTION INTERNAL FIXATION (ORIF) DISTAL FEMUR FRACTURE (Right Knee)     Patient location during evaluation: PACU Anesthesia Type: General Level of consciousness: sedated and patient cooperative Pain management: pain level controlled Vital Signs Assessment: post-procedure vital signs reviewed and stable Respiratory status: spontaneous breathing Cardiovascular status: stable Anesthetic complications: no   No complications documented.  Last Vitals:  Vitals:   02/16/21 0425 02/16/21 0700  BP: 110/78 123/85  Pulse: 68 76  Resp: 17 15  Temp: 36.6 C 36.7 C  SpO2: 97% 99%    Last Pain:  Vitals:   02/16/21 0814  TempSrc:   PainSc: [place]

## 2021-02-16 NOTE — Progress Notes (Signed)
The patient was discharge to her home in [place] via [employer].  Discharge instruction reviewed and verbalizes understanding.

## 2021-02-16 NOTE — Progress Notes (Signed)
    Subjective: Patient reports pain as moderate.  Tolerating diet.  Urinating.   No CP, SOB.  Has been working with PT/OT on mobilizing OOB.   Objective:   VITALS:   Vitals:   02/15/21 1602 02/15/21 1934 02/16/21 0425 02/16/21 0700  BP: 104/77 (!) 145/83 110/78 123/85  Pulse: 69 77 68 76  Resp: 18 17 17 15   Temp: 98.4 F (36.9 C) 98.3 F (36.8 C) 97.8 F (36.6 C) 98.1 F (36.7 C)  TempSrc: Oral  Oral Oral  SpO2: 98% 100% 97% 99%  Weight:      Height:       CBC Latest Ref Rng & Units 02/16/2021 02/15/2021 02/14/2021  WBC 4.0 - 10.5 K/uL 11.2(H) 16.1(H) 13.8(H)  Hemoglobin 12.0 - 15.0 g/dL 10.5(L) 10.7(L) 11.8(L)  Hematocrit 36.0 - 46.0 % 30.3(L) 31.6(L) 34.7(L)  Platelets 150 - 400 K/uL 309 273 303   BMP Latest Ref Rng & Units 02/15/2021 02/14/2021 02/14/2021  Glucose 70 - 99 mg/dL 142(H) - 114(H)  BUN 6 - 20 mg/dL 6 - 9  Creatinine 0.44 - 1.00 mg/dL 0.54 0.67 0.61  Sodium 135 - 145 mmol/L 133(L) - 137  Potassium 3.5 - 5.1 mmol/L 3.9 - 3.9  Chloride 98 - 111 mmol/L 104 - 106  CO2 22 - 32 mmol/L 24 - 23  Calcium 8.9 - 10.3 mg/dL 8.4(L) - 9.1   Intake/Output      03/21 0701 03/22 0700 03/22 0701 03/23 0700   P.O. 840    I.V. (mL/kg)     IV Piggyback 100    Total Intake(mL/kg) 940 (23.3)    Urine (mL/kg/hr)     Blood     Total Output     Net +940         Urine Occurrence 6 x    Stool Occurrence 1 x       Physical Exam: General: NAD. Laying in bed comfortably.  Resp: No increased wob Cardio: regular rate and rhythm ABD soft Neurologically intact MSK Neurovascularly intact Sensation intact distally Intact pulses distally Dorsiflexion/Plantar flexion intact Incision: dressing C/D/I Knee immobilizer and CAM boot on RLE  Assessment: 2 Days Post-Op  S/P Procedure(s) (LRB): OPEN REDUCTION INTERNAL FIXATION (ORIF) ANKLE FRACTURE (Right) OPEN REDUCTION INTERNAL FIXATION (ORIF) DISTAL FEMUR FRACTURE (Right) by Dr. Ernesta Amble. Percell Miller on 02/14/21  Principal  Problem:   Closed displaced oblique fracture of shaft of right femur ([place]) Active Problems:   Constipation   Alcohol abuse   Hypertension   COPD (chronic obstructive pulmonary disease) (HCC)   Hypomagnesemia   Leukocytosis   Tobacco abuse   Displaced fracture of lateral malleolus of right fibula, initial encounter for closed fracture   Plan: Up with therapy Incentive Spirometry Elevate and Apply ice  Weightbearing: TDWB RLE Insicional and dressing care: Dressings left intact until follow-up and Reinforce dressings as needed Orthopedic device(s): CAM boot and and knee immobilizer Showering: Keep dressing dry VTE prophylaxis: Lovenox 40mg  qd while inpatient, change to ASA 81mg  bid x 30 days once discharged, SCDs, ambulation Pain control: Continue current regimen Follow - up plan: 2 weeks Contact information:  Edmonia Lynch MD, Aggie Moats PA-C  Dispo: Likely going home. Will need either HHPT or OPPT. Ok to discharge from an ortho standpoint.   Britt Bottom, PA-C Office [phone] 02/16/2021, 7:57 AM

## 2021-02-16 NOTE — Discharge Summary (Signed)
Physician Discharge Summary  Beth [name] OEV:[id number] DOB: February 20, 1961 DOA: 02/12/2021  PCP: Lucia Gaskins, MD  Admit date: 02/12/2021 Discharge date: 02/16/2021  Admitted From: Home Disposition:  Home  Discharge Condition:Stable CODE STATUS:FULL Diet recommendation: Heart Healthy  Brief/Interim Summary: Patient is a 60 year old female with history of alcohol abuse, intracranial hemorrhage, cervical cancer, COPD, hypertension, tobacco abuse who presented to the emergency department after a fall while walking her 2 dogs.  After the fall, she was complaining of pain on the right knee and right ankle.  Imagings showed ankle fracture and distal femur fracture of the right.  Orthopedics consulted and she underwent ORIF.  PT/OT evaluation done.  Orthopedics cleared for discharge.  Home health requested.  Following problems were addressed during her hospitalization:  Right femur fracture/right ankle fracture: Status post ORIF of right ankle fracture, ORIF of distal femur fracture by Dr. Percell Miller on 3/20.  PT/OT evaluation done.  She will be continued on DVT prophylaxis with aspirin 81 mg 2  times daily for 30 days.  Leukocytosis: Much improved  Hypomagnesemia:  Continue oral supplementation on discharge  Hypertension: On Norvasc 5 mg daily at home  COPD: Currently smoker.  No wheezes.  Chest x-ray no sign pneumonia. Marland Kitchen  She was counseled for smoking cessation.  On nicotine patch.  Chronic alcohol abuse: Continue thiamine folic acid.  Counseled for alcohol cessation  Underweight: BMI of 16.2.  Discharge Diagnoses:  Principal Problem:   Closed displaced oblique fracture of shaft of right femur ([place]) Active Problems:   Constipation   Alcohol abuse   Hypertension   COPD (chronic obstructive pulmonary disease) (HCC)   Hypomagnesemia   Leukocytosis   Tobacco abuse   Displaced fracture of lateral malleolus of right fibula, initial encounter for closed fracture    Discharge  Instructions  Discharge Instructions    Diet - low sodium heart healthy   Complete by: As directed    Discharge instructions   Complete by: As directed    1)Please follow-up with orthopedics in 2 weeks.  Name and number of the provider  has been attached 2)Take prescribed medications as instucted   Increase activity slowly   Complete by: As directed    No wound care   Complete by: As directed      Allergies as of 02/16/2021      Reactions   Amoxicillin Nausea Only   Codeine Itching      Medication List    STOP taking these medications   MAGNESIUM PO   naproxen 500 MG tablet Commonly known as: NAPROSYN     TAKE these medications   albuterol 108 (90 Base) MCG/ACT inhaler Commonly known as: VENTOLIN HFA Inhale 1-2 puffs into the lungs every 6 (six) hours as needed for wheezing or shortness of breath.   albuterol (2.5 MG/3ML) 0.083% nebulizer solution Commonly known as: PROVENTIL Take 2.5 mg by nebulization every 6 (six) hours as needed for wheezing or shortness of breath.   amLODipine 5 MG tablet Commonly known as: NORVASC Take 5 mg by mouth daily.   aspirin EC 81 MG tablet Take 1 tablet (81 mg total) by mouth 2 (two) times daily. Swallow whole.   folic acid 1 MG tablet Commonly known as: FOLVITE Take 1 tablet (1 mg total) by mouth daily. Start taking on: February 17, 2021   magnesium oxide 400 (241.3 Mg) MG tablet Commonly known as: MAG-OX Take 1 tablet (400 mg total) by mouth daily. Start taking on: February 17, 2021  multivitamin with minerals Tabs tablet Take 1 tablet by mouth daily.   nicotine 14 mg/24hr patch Commonly known as: NICODERM CQ - dosed in mg/24 hours Place 1 patch (14 mg total) onto the skin daily. Start taking on: February 17, 2021   oxycodone 5 MG capsule Commonly known as: OXY-IR Take 1 capsule (5 mg total) by mouth every 6 (six) hours as needed for pain. What changed: when to take this   polyethylene glycol 17 g packet Commonly known as:  MIRALAX / GLYCOLAX Take 17 g by mouth daily as needed for mild constipation.   thiamine 100 MG tablet Take 1 tablet (100 mg total) by mouth daily. Start taking on: February 17, 2021   Vitamin D3 125 MCG (5000 UT) Tabs Take 5,000 Units by mouth daily.       Follow-up Information    [name], MD. Schedule an appointment as soon as possible for a visit in 2 week(s).   Specialty: Orthopedic Surgery Contact information: [address] [place] [address] [phone]              Allergies  Allergen Reactions  . Amoxicillin Nausea Only  . Codeine Itching    Consultations:  Orthopedics   Procedures/Studies: DG Knee 1-2 Views Right  Result Date: 02/14/2021 CLINICAL DATA:  Distal femur operative reduction and internal fixation EXAM: RIGHT KNEE - 1-2 VIEW COMPARISON:  CT of the knee from 02/12/2021 FINDINGS: Lateral plate and screw fixator of the distal femur noted traversing the oblique fracture. The oblique fracture also has known extension into the intercondylar notch, there are transverse screws in the FLAIR distal component of the plate which extend across this known fracture plane into the medial epicondylar region. Alignment is near anatomic. IMPRESSION: 1. Near anatomic alignment of the distal femoral fracture status post lateral plate and screw fixator placement. No complicating feature. Electronically Signed   By: Van Clines M.D.   On: 02/14/2021 15:17   DG Ankle 2 Views Right  Result Date: 02/14/2021 CLINICAL DATA:  Right ankle operative reduction and internal fixation EXAM: DG C-ARM 1-60 MIN; RIGHT ANKLE - 2 VIEW COMPARISON:  02/12/2021 FINDINGS: Lateral plate and screw fixation of the lateral malleolar oblique fracture noted along with a anterior to posterior oriented screw in the vicinity of the original fracture plane. Near anatomic alignment without complicating feature. IMPRESSION: 1. ORIF of lateral malleolar fracture.  Electronically Signed   By: Van Clines M.D.   On: 02/14/2021 15:15   DG Ankle Complete Right  Result Date: 02/12/2021 CLINICAL DATA:  Fall EXAM: RIGHT ANKLE - COMPLETE 3+ VIEW COMPARISON:  None. FINDINGS: Nondisplaced distal fibular fracture noted at the level of the ankle mortise. No tibial abnormality. Joint space maintained. Lateral soft tissue swelling. IMPRESSION: Nondisplaced distal fibular fracture. Electronically Signed   By: Rolm Baptise M.D.   On: 02/12/2021 22:28   CT Head Wo Contrast  Result Date: 02/12/2021 CLINICAL DATA:  Fall EXAM: CT HEAD WITHOUT CONTRAST CT CERVICAL SPINE WITHOUT CONTRAST TECHNIQUE: Multidetector CT imaging of the head and cervical spine was performed following the standard protocol without intravenous contrast. Multiplanar CT image reconstructions of the cervical spine were also generated. COMPARISON:  CT head and cervical spine 10/09/2019 (report only, images unavailable) CT head and cervical spine 07/06/2019 FINDINGS: CT HEAD FINDINGS Brain: No evidence of acute infarction, hemorrhage, hydrocephalus, extra-axial collection, visible mass lesion or mass effect. Vascular: Atherosclerotic calcification of the carotid siphons. No hyperdense vessel. Skull: Chronic right  supraorbital scarring. No acute soft tissue swelling, gas or laceration. No large scalp hematoma. No calvarial fracture or acute osseous injury within the included levels of imaging. Sinuses/Orbits: Chronic rightward anterior nasal septal deviation and in contacting left-sided nasal septal spur. Paranasal sinuses and mastoid air cells are predominantly clear. Middle ear cavities are clear. Included orbital structures are unremarkable. Other: None CT CERVICAL SPINE FINDINGS Alignment: Slight exaggeration of the upper cervical lordosis. Unchanged likely degenerative stepwise anterolisthesis C4-C7. Grossly unchanged alignment of a nonunited type 2 dens fracture and the asymmetric positioning of the lateral  masses C1-C2. No acute traumatic listhesis is seen. Skull base and vertebrae: Redemonstration of the chronic, well corticated type 2 dens fracture with posterior translation and angulation. No acute cervical spine fracture is seen. No visible skull base fractures or other acute osseous abnormalities. No suspicious osseous lesions. Cervical spondylitic changes as below. Additional arthrosis about the atlantodental interval and basion dens intervals, unchanged from prior. Soft tissues and spinal canal: No pre or paravertebral fluid or swelling. No visible canal hematoma. Disc levels: Multilevel intervertebral disc height loss with spondylitic endplate changes. At most mild resulting canal stenosis C4-5, C5-6. Multilevel uncinate spurring and facet hypertrophic changes are present as well maximal C4-C7 where there is mild-to-moderate foraminal narrowing bilaterally. Upper chest: No acute abnormality in the upper chest or imaged lung apices. Stable emphysematous changes. Other: No concerning thyroid nodule or mass. IMPRESSION: 1. No acute intracranial abnormality. 2. Grossly unchanged alignment of the chronic, well corticated type 2 dens fracture with posterior translation and angulation. 3. No acute cervical spine fracture or traumatic listhesis. 4. Multilevel degenerative changes of the cervical spine as described above. Electronically Signed   By: Lovena Le M.D.   On: 02/12/2021 23:20   CT Cervical Spine Wo Contrast  Result Date: 02/12/2021 CLINICAL DATA:  Fall EXAM: CT HEAD WITHOUT CONTRAST CT CERVICAL SPINE WITHOUT CONTRAST TECHNIQUE: Multidetector CT imaging of the head and cervical spine was performed following the standard protocol without intravenous contrast. Multiplanar CT image reconstructions of the cervical spine were also generated. COMPARISON:  CT head and cervical spine 10/09/2019 (report only, images unavailable) CT head and cervical spine 07/06/2019 FINDINGS: CT HEAD FINDINGS Brain: No evidence  of acute infarction, hemorrhage, hydrocephalus, extra-axial collection, visible mass lesion or mass effect. Vascular: Atherosclerotic calcification of the carotid siphons. No hyperdense vessel. Skull: Chronic right supraorbital scarring. No acute soft tissue swelling, gas or laceration. No large scalp hematoma. No calvarial fracture or acute osseous injury within the included levels of imaging. Sinuses/Orbits: Chronic rightward anterior nasal septal deviation and in contacting left-sided nasal septal spur. Paranasal sinuses and mastoid air cells are predominantly clear. Middle ear cavities are clear. Included orbital structures are unremarkable. Other: None CT CERVICAL SPINE FINDINGS Alignment: Slight exaggeration of the upper cervical lordosis. Unchanged likely degenerative stepwise anterolisthesis C4-C7. Grossly unchanged alignment of a nonunited type 2 dens fracture and the asymmetric positioning of the lateral masses C1-C2. No acute traumatic listhesis is seen. Skull base and vertebrae: Redemonstration of the chronic, well corticated type 2 dens fracture with posterior translation and angulation. No acute cervical spine fracture is seen. No visible skull base fractures or other acute osseous abnormalities. No suspicious osseous lesions. Cervical spondylitic changes as below. Additional arthrosis about the atlantodental interval and basion dens intervals, unchanged from prior. Soft tissues and spinal canal: No pre or paravertebral fluid or swelling. No visible canal hematoma. Disc levels: Multilevel intervertebral disc height loss with spondylitic endplate changes. At most mild  resulting canal stenosis C4-5, C5-6. Multilevel uncinate spurring and facet hypertrophic changes are present as well maximal C4-C7 where there is mild-to-moderate foraminal narrowing bilaterally. Upper chest: No acute abnormality in the upper chest or imaged lung apices. Stable emphysematous changes. Other: No concerning thyroid nodule or  mass. IMPRESSION: 1. No acute intracranial abnormality. 2. Grossly unchanged alignment of the chronic, well corticated type 2 dens fracture with posterior translation and angulation. 3. No acute cervical spine fracture or traumatic listhesis. 4. Multilevel degenerative changes of the cervical spine as described above. Electronically Signed   By: Lovena Le M.D.   On: 02/12/2021 23:20   CT KNEE RIGHT WO CONTRAST  Result Date: 02/12/2021 CLINICAL DATA:  Knee fracture, trip and fall while walking dogs. EXAM: CT OF THE RIGHT KNEE WITHOUT CONTRAST TECHNIQUE: Multidetector CT imaging of the RIGHT knee was performed according to the standard protocol. Multiplanar CT image reconstructions were also generated. COMPARISON:  Radiograph earlier today FINDINGS: Bones/Joint/Cartilage Mildly displaced and comminuted distal femur fracture. Spiral component involves the distal femoral metadiaphysis posteriorly, and extends to the lateral femoral cortex. There is mild impaction involving the posterolateral femoral condyle. There is intra-articular extension. Articular offset at the patellofemoral joint is 2-3 mm. Extension through the intercondylar notch posteriorly is nondisplaced. No acute fracture of the patella, tibia, or fibula. There is a large lipohemarthrosis, which extends into a Baker cyst. Ligaments Suboptimally assessed by CT. Posterior cruciate ligament fibers remain intact. There is some indistinctness of the anterior cruciate ligament. Muscles and Tendons There is moderate intramuscular edema involving the posterior thigh muscle compartment. Mild intramuscular edema involving the posterior calf muscle compartment. Soft tissues Generalized soft tissue edema. IMPRESSION: 1. Mildly displaced and comminuted distal femur fracture with intra-articular extension. Fracture extends through the intercondylar notch. Articular offset at the patellofemoral joint is 2-3 mm. 2. Large lipohemarthrosis, which extends into a Baker  cyst. Electronically Signed   By: Keith Rake M.D.   On: 02/12/2021 23:50   Chest Portable 1 View  Result Date: 02/13/2021 CLINICAL DATA:  60 year old female with preop chest radiograph. EXAM: PORTABLE CHEST 1 VIEW COMPARISON:  Chest radiograph dated 01/11/2021. FINDINGS: Fifty-one. No focal consolidation, pleural effusion, pneumothorax. The cardiac silhouette is within limits. Osteopenia with degenerative changes of the spine. Old bilateral rib fractures. No acute osseous pathology. IMPRESSION: No active cardiopulmonary disease. Electronically Signed   By: Anner Crete M.D.   On: 02/13/2021 01:35   DG Knee Complete 4 Views Right  Result Date: 02/12/2021 CLINICAL DATA:  Fall, pain EXAM: RIGHT KNEE - COMPLETE 4+ VIEW COMPARISON:  None FINDINGS: There is an oblique minimally displaced fracture through the distal fibular metaphysis. This extends to the region of the lateral condyle. Large joint effusion. No subluxation or dislocation. IMPRESSION: Oblique distal femoral fracture.  Large joint effusion. Electronically Signed   By: Rolm Baptise M.D.   On: 02/12/2021 22:30   DG C-Arm 1-60 Min  Result Date: 02/14/2021 CLINICAL DATA:  Right ankle operative reduction and internal fixation EXAM: DG C-ARM 1-60 MIN; RIGHT ANKLE - 2 VIEW COMPARISON:  02/12/2021 FINDINGS: Lateral plate and screw fixation of the lateral malleolar oblique fracture noted along with a anterior to posterior oriented screw in the vicinity of the original fracture plane. Near anatomic alignment without complicating feature. IMPRESSION: 1. ORIF of lateral malleolar fracture. Electronically Signed   By: Van Clines M.D.   On: 02/14/2021 15:15       Subjective:  Patient seen and examined the bedside this morning.  Hemodynamically  stable for discharge today.  Discharge Exam: Vitals:   02/16/21 0425 02/16/21 0700  BP: 110/78 123/85  Pulse: 68 76  Resp: 17 15  Temp: 97.8 F (36.6 C) 98.1 F (36.7 C)  SpO2: 97%  99%   Vitals:   02/15/21 1602 02/15/21 1934 02/16/21 0425 02/16/21 0700  BP: 104/77 (!) 145/83 110/78 123/85  Pulse: 69 77 68 76  Resp: 18 17 17 15   Temp: 98.4 F (36.9 C) 98.3 F (36.8 C) 97.8 F (36.6 C) 98.1 F (36.7 C)  TempSrc: Oral  Oral Oral  SpO2: 98% 100% 97% 99%  Weight:      Height:        General: Pt is alert, awake, not in acute distress Cardiovascular: RRR, S1/S2 +, no rubs, no gallops Respiratory: CTA bilaterally, no wheezing, no rhonchi Abdominal: Soft, NT, ND, bowel sounds + Extremities: no edema, no cyanosis    The results of significant diagnostics from this hospitalization (including imaging, microbiology, ancillary and laboratory) are listed below for reference.     Microbiology: Recent Results (from the past 240 hour(s))  Resp Panel by RT-PCR (Flu A&B, Covid) Nasopharyngeal Swab     Status: None   Collection Time: 02/12/21 11:10 PM   Specimen: Nasopharyngeal Swab; Nasopharyngeal(NP) swabs in vial transport medium  Result Value Ref Range Status   SARS Coronavirus 2 by RT PCR NEGATIVE NEGATIVE Final    Comment: (NOTE) SARS-CoV-2 target nucleic acids are NOT DETECTED.  The SARS-CoV-2 RNA is generally detectable in upper respiratory specimens during the acute phase of infection. The lowest concentration of SARS-CoV-2 viral copies this assay can detect is 138 copies/mL. A negative result does not preclude SARS-Cov-2 infection and should not be used as the sole basis for treatment or other patient management decisions. A negative result may occur with  improper specimen collection/handling, submission of specimen other than nasopharyngeal swab, presence of viral mutation(s) within the areas targeted by this assay, and inadequate number of viral copies(<138 copies/mL). A negative result must be combined with clinical observations, patient history, and epidemiological information. The expected result is Negative.  Fact Sheet for Patients:   EntrepreneurPulse.com.au  Fact Sheet for Healthcare Providers:  IncredibleEmployment.be  This test is no t yet approved or cleared by the [place] FDA and  has been authorized for detection and/or diagnosis of SARS-CoV-2 by FDA under an Emergency Use Authorization (EUA). This EUA will remain  in effect (meaning this test can be used) for the duration of the COVID-19 declaration under Section 564(b)(1) of the Act, 21 U.S.C.section 360bbb-3(b)(1), unless the authorization is terminated  or revoked sooner.       Influenza A by PCR NEGATIVE NEGATIVE Final   Influenza B by PCR NEGATIVE NEGATIVE Final    Comment: (NOTE) The Xpert Xpress SARS-CoV-2/FLU/RSV plus assay is intended as an aid in the diagnosis of influenza from Nasopharyngeal swab specimens and should not be used as a sole basis for treatment. Nasal washings and aspirates are unacceptable for Xpert Xpress SARS-CoV-2/FLU/RSV testing.  Fact Sheet for Patients: EntrepreneurPulse.com.au  Fact Sheet for Healthcare Providers: IncredibleEmployment.be  This test is not yet approved or cleared by the [place] FDA and has been authorized for detection and/or diagnosis of SARS-CoV-2 by FDA under an Emergency Use Authorization (EUA). This EUA will remain in effect (meaning this test can be used) for the duration of the COVID-19 declaration under Section 564(b)(1) of the Act, 21 U.S.C. section 360bbb-3(b)(1), unless the authorization is terminated or revoked.  Performed at [facility], [address] [place], [place] [address]   MRSA PCR Screening     Status: None   Collection Time: 02/14/21  6:29 AM   Specimen: Nasal Mucosa; Nasopharyngeal  Result Value Ref Range Status   MRSA by PCR NEGATIVE NEGATIVE Final    Comment:        The GeneXpert MRSA Assay (FDA approved for NASAL specimens only), is one component of a comprehensive MRSA  colonization surveillance program. It is not intended to diagnose MRSA infection nor to guide or monitor treatment for MRSA infections. Performed at [facility], [place] [address], [place],  [address]      Labs: BNP (last 3 results) No results for input(s): BNP in the last 8760 hours. Basic Metabolic Panel: Recent Labs  Lab 02/12/21 2327 02/14/21 0124 02/14/21 1232 02/15/21 0256 02/16/21 0337  NA 135 137  --  133*  --   K 3.6 3.9  --  3.9  --   CL 104 106  --  104  --   CO2 20* 23  --  24  --   GLUCOSE 90 114*  --  142*  --   BUN 6 9  --  6  --   CREATININE 0.50 0.61 0.67 0.54  --   CALCIUM 9.1 9.1  --  8.4*  --   MG 1.5* 2.0  --  1.5* 1.6*  PHOS 4.3  --   --   --   --    Liver Function Tests: Recent Labs  Lab 02/15/21 0256  AST 16  ALT 13  ALKPHOS 37*  BILITOT 0.6  PROT 5.6*  ALBUMIN 2.9*   No results for input(s): LIPASE, AMYLASE in the last 168 hours. No results for input(s): AMMONIA in the last 168 hours. CBC: Recent Labs  Lab 02/12/21 2327 02/13/21 0708 02/14/21 0124 02/14/21 1232 02/15/21 0256 02/16/21 0337  WBC 12.8*  --  10.6* 13.8* 16.1* 11.2*  NEUTROABS 7.4  --  6.0  --  12.6* 6.0  HGB 12.4 12.1 12.1 11.8* 10.7* 10.5*  HCT 38.4 35.6* 35.6* 34.7* 31.6* 30.3*  MCV 97.2  --  94.2 95.9 95.2 95.3  PLT 361  --  333 303 273 309   Cardiac Enzymes: No results for input(s): CKTOTAL, CKMB, CKMBINDEX, TROPONINI in the last 168 hours. BNP: Invalid input(s): POCBNP CBG: No results for input(s): GLUCAP in the last 168 hours. D-Dimer No results for input(s): DDIMER in the last 72 hours. Hgb A1c No results for input(s): HGBA1C in the last 72 hours. Lipid Profile No results for input(s): CHOL, HDL, LDLCALC, TRIG, CHOLHDL, LDLDIRECT in the last 72 hours. Thyroid function studies Recent Labs    02/15/21 0256  TSH 0.318*  T3FREE 1.2*   Anemia work up No results for input(s): VITAMINB12, FOLATE, FERRITIN, TIBC, IRON, RETICCTPCT in the  last 72 hours. Urinalysis    Component Value Date/Time   COLORURINE STRAW (A) 02/13/2021 1654   APPEARANCEUR HAZY (A) 02/13/2021 1654   LABSPEC 1.005 02/13/2021 1654   PHURINE 7.0 02/13/2021 1654   GLUCOSEU NEGATIVE 02/13/2021 1654   HGBUR NEGATIVE 02/13/2021 1654   BILIRUBINUR NEGATIVE 02/13/2021 1654   KETONESUR NEGATIVE 02/13/2021 1654   PROTEINUR NEGATIVE 02/13/2021 1654   UROBILINOGEN 0.2 10/02/2011 2305   NITRITE NEGATIVE 02/13/2021 1654   LEUKOCYTESUR NEGATIVE 02/13/2021 1654   Sepsis Labs Invalid input(s): PROCALCITONIN,  WBC,  LACTICIDVEN Microbiology Recent Results (from the past 240 hour(s))  Resp Panel by RT-PCR (  Flu A&B, Covid) Nasopharyngeal Swab     Status: None   Collection Time: 02/12/21 11:10 PM   Specimen: Nasopharyngeal Swab; Nasopharyngeal(NP) swabs in vial transport medium  Result Value Ref Range Status   SARS Coronavirus 2 by RT PCR NEGATIVE NEGATIVE Final    Comment: (NOTE) SARS-CoV-2 target nucleic acids are NOT DETECTED.  The SARS-CoV-2 RNA is generally detectable in upper respiratory specimens during the acute phase of infection. The lowest concentration of SARS-CoV-2 viral copies this assay can detect is 138 copies/mL. A negative result does not preclude SARS-Cov-2 infection and should not be used as the sole basis for treatment or other patient management decisions. A negative result may occur with  improper specimen collection/handling, submission of specimen other than nasopharyngeal swab, presence of viral mutation(s) within the areas targeted by this assay, and inadequate number of viral copies(<138 copies/mL). A negative result must be combined with clinical observations, patient history, and epidemiological information. The expected result is Negative.  Fact Sheet for Patients:  EntrepreneurPulse.com.au  Fact Sheet for Healthcare Providers:  IncredibleEmployment.be  This test is no t yet approved or  cleared by the [place] FDA and  has been authorized for detection and/or diagnosis of SARS-CoV-2 by FDA under an Emergency Use Authorization (EUA). This EUA will remain  in effect (meaning this test can be used) for the duration of the COVID-19 declaration under Section 564(b)(1) of the Act, 21 U.S.C.section 360bbb-3(b)(1), unless the authorization is terminated  or revoked sooner.       Influenza A by PCR NEGATIVE NEGATIVE Final   Influenza B by PCR NEGATIVE NEGATIVE Final    Comment: (NOTE) The Xpert Xpress SARS-CoV-2/FLU/RSV plus assay is intended as an aid in the diagnosis of influenza from Nasopharyngeal swab specimens and should not be used as a sole basis for treatment. Nasal washings and aspirates are unacceptable for Xpert Xpress SARS-CoV-2/FLU/RSV testing.  Fact Sheet for Patients: EntrepreneurPulse.com.au  Fact Sheet for Healthcare Providers: IncredibleEmployment.be  This test is not yet approved or cleared by the [place] FDA and has been authorized for detection and/or diagnosis of SARS-CoV-2 by FDA under an Emergency Use Authorization (EUA). This EUA will remain in effect (meaning this test can be used) for the duration of the COVID-19 declaration under Section 564(b)(1) of the Act, 21 U.S.C. section 360bbb-3(b)(1), unless the authorization is terminated or revoked.  Performed at [facility], [address], [place], [place] [address]   MRSA PCR Screening     Status: None   Collection Time: 02/14/21  6:29 AM   Specimen: Nasal Mucosa; Nasopharyngeal  Result Value Ref Range Status   MRSA by PCR NEGATIVE NEGATIVE Final    Comment:        The GeneXpert MRSA Assay (FDA approved for NASAL specimens only), is one component of a comprehensive MRSA colonization surveillance program. It is not intended to diagnose MRSA infection nor to guide or monitor treatment for MRSA infections. Performed at [facility], [place] [address] [place],  [address]     Please note: You were cared for by a hospitalist during your hospital stay. Once you are discharged, your primary care physician will handle any further medical issues. Please note that NO REFILLS for any discharge medications will be authorized once you are discharged, as it is imperative that you return to your primary care physician (or establish a relationship with a primary care physician if you do not have one) for your post hospital discharge needs so that  they can reassess your need for medications and monitor your lab values.    Time coordinating discharge: 40 minutes  SIGNED:   Shelly Coss, MD  [facility] 02/16/2021, 12:59 PM Pager [phone]  If 7PM-7AM, please contact night-coverage [email or web address] Password [email or web address]

## 2021-02-17 ENCOUNTER — Telehealth: Payer: Self-pay | Admitting: *Deleted

## 2021-02-17 NOTE — Telephone Encounter (Signed)
Transition Care Management Follow-up Telephone Call  Date of discharge and from where: [place]  How have you been since you were released from the hospital? Doing better  Any questions or concerns? No  Items Reviewed:  Did the pt receive and understand the discharge instructions provided? Yes   Medications obtained and verified? Yes   Other? No   Any new allergies since your discharge? No   Dietary orders reviewed? yes  Do you have support at home? Yes   Home Care and Equipment/Supplies:Functional Questionnaire: (I = Independent and D = Dependent) ADLs: D  Bathing/Dressing- I/D  Patients husband is helping  Meal Prep- D  Eating- I  Maintaining continence- I  Transferring/Ambulation- I/D  Managing Meds- I  Follow up appointments reviewed:   PCP Hospital f/u appt confirmed? No     Specialist Hospital f/u appt confirmed? Yes    Are transportation arrangements needed? No   If their condition worsens, is the pt aware to call PCP or go to the Emergency Dept.?yes  Was the patient provided with contact information for the PCP's office or ED? yes  Was to pt encouraged to call back with questions or concerns? yes

## 2021-04-12 ENCOUNTER — Other Ambulatory Visit: Payer: Self-pay

## 2021-04-12 ENCOUNTER — Encounter (HOSPITAL_COMMUNITY): Payer: Self-pay | Admitting: Physical Therapy

## 2021-04-12 ENCOUNTER — Ambulatory Visit (HOSPITAL_COMMUNITY): Payer: Medicaid Other | Attending: Orthopedic Surgery | Admitting: Physical Therapy

## 2021-04-12 DIAGNOSIS — M25571 Pain in right ankle and joints of right foot: Secondary | ICD-10-CM | POA: Diagnosis present

## 2021-04-12 DIAGNOSIS — M25671 Stiffness of right ankle, not elsewhere classified: Secondary | ICD-10-CM | POA: Insufficient documentation

## 2021-04-12 DIAGNOSIS — R262 Difficulty in walking, not elsewhere classified: Secondary | ICD-10-CM | POA: Insufficient documentation
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
## 2021-04-23 ENCOUNTER — Telehealth (HOSPITAL_COMMUNITY): Payer: Self-pay

## 2021-04-23 ENCOUNTER — Ambulatory Visit (HOSPITAL_COMMUNITY): Payer: Medicaid Other

## 2021-04-23 NOTE — Telephone Encounter (Signed)
No show, called and left message concerning missed apt today.  Included next apt date and time with contact information included.  Requested pt to call and cancel/reschedule if unable to make it to future apts.    Ihor Austin, LPTA/CLT; Delana [name] [phone]

## 2021-04-27 ENCOUNTER — Ambulatory Visit (HOSPITAL_COMMUNITY): Payer: Medicaid Other | Admitting: Physical Therapy

## 2021-04-29 ENCOUNTER — Ambulatory Visit (HOSPITAL_COMMUNITY): Payer: Medicaid Other | Attending: Orthopedic Surgery

## 2021-04-29 ENCOUNTER — Other Ambulatory Visit: Payer: Self-pay

## 2021-04-29 ENCOUNTER — Encounter (HOSPITAL_COMMUNITY): Payer: Self-pay

## 2021-04-29 DIAGNOSIS — M25571 Pain in right ankle and joints of right foot: Secondary | ICD-10-CM | POA: Insufficient documentation

## 2021-04-29 DIAGNOSIS — R262 Difficulty in walking, not elsewhere classified: Secondary | ICD-10-CM | POA: Insufficient documentation

## 2021-04-29 DIAGNOSIS — M25671 Stiffness of right ankle, not elsewhere classified: Secondary | ICD-10-CM | POA: Insufficient documentation
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
## 2021-04-29 NOTE — Patient Instructions (Addendum)
Toe / Heel Raise (Standing)    Standing with support, raise heels, then rock back on heels and raise toes. Repeat 10 times.  Copyright  VHI. All rights reserved.   FUNCTIONAL MOBILITY: Squat    Stance: shoulder-width on floor. Bend hips and knees. Keep back straight. Do not allow knees to bend past toes. Squeeze glutes and quads to stand. 10 reps per set, 2 sets per day, 4 days per week  Copyright  VHI. All rights reserved.   Gastroc Stretch    Stand with right foot back, leg straight, forward leg bent. Keeping heel on floor, turned slightly out, lean into wall until stretch is felt in calf. Hold 30 seconds. Repeat 3 times per set. Do 2 sets per day. http://orth.exer.[email or web address]   Copyright  VHI. All rights reserved.

## 2021-05-04 ENCOUNTER — Encounter (HOSPITAL_COMMUNITY): Payer: Self-pay | Admitting: Physical Therapy

## 2021-05-04 ENCOUNTER — Other Ambulatory Visit: Payer: Self-pay

## 2021-05-04 ENCOUNTER — Ambulatory Visit (HOSPITAL_COMMUNITY): Payer: Medicaid Other | Admitting: Physical Therapy

## 2021-05-04 DIAGNOSIS — M25671 Stiffness of right ankle, not elsewhere classified: Secondary | ICD-10-CM

## 2021-05-04 DIAGNOSIS — R262 Difficulty in walking, not elsewhere classified: Secondary | ICD-10-CM

## 2021-05-04 DIAGNOSIS — M25571 Pain in right ankle and joints of right foot: Secondary | ICD-10-CM
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
## 2021-05-06 ENCOUNTER — Telehealth (HOSPITAL_COMMUNITY): Payer: Self-pay | Admitting: Physical Therapy

## 2021-05-06 ENCOUNTER — Other Ambulatory Visit: Payer: Self-pay

## 2021-05-06 ENCOUNTER — Ambulatory Visit (HOSPITAL_COMMUNITY): Payer: Medicaid Other | Admitting: Physical Therapy

## 2021-05-06 NOTE — Telephone Encounter (Signed)
Pt signed in at 8:40 am for 9:15 am appointment.  Therapist went out to waiting room to get patient at 9:10 am and she was not in waiting room, gym or parking lot.  Called and went straight to VM.  Therapist left message regarding missed appointmen  Teena Irani, PTA/CLT [phone] t.

## 2021-05-11 ENCOUNTER — Encounter (HOSPITAL_COMMUNITY): Payer: Self-pay | Admitting: Physical Therapy

## 2021-05-11 ENCOUNTER — Encounter (HOSPITAL_COMMUNITY): Payer: Medicaid Other

## 2021-05-11 ENCOUNTER — Telehealth (HOSPITAL_COMMUNITY): Payer: Self-pay

## 2021-05-11 NOTE — Telephone Encounter (Signed)
pt has cancelled the remainder of her appts because she said she can do it on her own

## 2021-05-11 NOTE — Therapy (Signed)
[place], [place], [address] Phone: [phone]   Fax:  [phone]  Patient Details  Name: Beth [name] MRN: [id number] Date of Birth: [birth year] Referring Provider:  No ref. provider found  Encounter Date: 05/11/2021   PHYSICAL THERAPY DISCHARGE SUMMARY  Visits from Start of Care: 3  Current functional level related to goals / functional outcomes: Unknown as pt did not return for reassessment    Remaining deficits: unknown   Education / Equipment: HEP   Patient agrees to discharge. Patient goals were partially met. Patient is being discharged due to being pleased with the current functional level. [place], [place] CLT [phone]  05/11/2021, 9:59 AM  [facility] [address] [place], [place], [address] Phone: [phone]   Fax:  [phone]

## 2021-05-13 ENCOUNTER — Encounter (HOSPITAL_COMMUNITY): Payer: Medicaid Other | Admitting: Physical Therapy

## 2021-05-18 ENCOUNTER — Encounter (HOSPITAL_COMMUNITY): Payer: Medicaid Other

## 2021-05-20 ENCOUNTER — Encounter (HOSPITAL_COMMUNITY): Payer: Medicaid Other | Admitting: Physical Therapy

## 2021-05-25 ENCOUNTER — Encounter (HOSPITAL_COMMUNITY): Payer: Medicaid Other

## 2021-05-27 ENCOUNTER — Encounter (HOSPITAL_COMMUNITY): Payer: Medicaid Other | Admitting: Physical Therapy

## 2021-06-01 ENCOUNTER — Encounter (HOSPITAL_COMMUNITY): Payer: Medicaid Other

## 2021-06-03 ENCOUNTER — Encounter (HOSPITAL_COMMUNITY): Payer: Medicaid Other | Admitting: Physical Therapy

## 2021-06-04 ENCOUNTER — Telehealth: Payer: Self-pay

## 2021-06-04 NOTE — Telephone Encounter (Signed)
No new patient per Pearline Cables

## 2021-07-21 ENCOUNTER — Other Ambulatory Visit: Payer: Self-pay | Admitting: Internal Medicine

## 2021-07-21 DIAGNOSIS — Z1231 Encounter for screening mammogram for malignant neoplasm of breast: Secondary | ICD-10-CM

## 2021-07-21 DIAGNOSIS — M810 Age-related osteoporosis without current pathological fracture: Secondary | ICD-10-CM

## 2021-10-16 ENCOUNTER — Emergency Department (HOSPITAL_COMMUNITY): Payer: Medicaid Other

## 2021-10-16 ENCOUNTER — Inpatient Hospital Stay (HOSPITAL_COMMUNITY)
Admission: EM | Admit: 2021-10-16 | Discharge: 2021-10-18 | DRG: 480 | Disposition: A | Discharge status: Home / Self Care | Payer: Medicaid Other | Attending: Internal Medicine | Admitting: Internal Medicine

## 2021-10-16 ENCOUNTER — Other Ambulatory Visit: Payer: Self-pay

## 2021-10-16 ENCOUNTER — Inpatient Hospital Stay (HOSPITAL_COMMUNITY): Payer: Medicaid Other

## 2021-10-16 ENCOUNTER — Encounter (HOSPITAL_COMMUNITY): Payer: Self-pay | Admitting: Emergency Medicine

## 2021-10-16 DIAGNOSIS — Y92009 Unspecified place in unspecified non-institutional (private) residence as the place of occurrence of the external cause: Secondary | ICD-10-CM

## 2021-10-16 DIAGNOSIS — M25571 Pain in right ankle and joints of right foot: Secondary | ICD-10-CM

## 2021-10-16 DIAGNOSIS — Z8541 Personal history of malignant neoplasm of cervix uteri: Secondary | ICD-10-CM | POA: Diagnosis not present

## 2021-10-16 DIAGNOSIS — D72829 Elevated white blood cell count, unspecified: Secondary | ICD-10-CM | POA: Diagnosis not present

## 2021-10-16 DIAGNOSIS — E43 Unspecified severe protein-calorie malnutrition: Secondary | ICD-10-CM | POA: Insufficient documentation

## 2021-10-16 DIAGNOSIS — Z20822 Contact with and (suspected) exposure to covid-19: Secondary | ICD-10-CM | POA: Diagnosis not present

## 2021-10-16 DIAGNOSIS — F17210 Nicotine dependence, cigarettes, uncomplicated: Secondary | ICD-10-CM | POA: Diagnosis not present

## 2021-10-16 DIAGNOSIS — Z681 Body mass index (BMI) 19 or less, adult: Secondary | ICD-10-CM | POA: Diagnosis not present

## 2021-10-16 DIAGNOSIS — S72141A Displaced intertrochanteric fracture of right femur, initial encounter for closed fracture: Secondary | ICD-10-CM | POA: Diagnosis present

## 2021-10-16 DIAGNOSIS — Z79899 Other long term (current) drug therapy: Secondary | ICD-10-CM | POA: Diagnosis not present

## 2021-10-16 DIAGNOSIS — R636 Underweight: Secondary | ICD-10-CM

## 2021-10-16 DIAGNOSIS — F1010 Alcohol abuse, uncomplicated: Secondary | ICD-10-CM | POA: Diagnosis present

## 2021-10-16 DIAGNOSIS — Z720 Tobacco use: Secondary | ICD-10-CM | POA: Diagnosis not present

## 2021-10-16 DIAGNOSIS — E876 Hypokalemia: Secondary | ICD-10-CM | POA: Diagnosis not present

## 2021-10-16 DIAGNOSIS — Z880 Allergy status to penicillin: Secondary | ICD-10-CM

## 2021-10-16 DIAGNOSIS — Z01811 Encounter for preprocedural respiratory examination: Secondary | ICD-10-CM

## 2021-10-16 DIAGNOSIS — S7291XA Unspecified fracture of right femur, initial encounter for closed fracture: Secondary | ICD-10-CM | POA: Diagnosis present

## 2021-10-16 DIAGNOSIS — J449 Chronic obstructive pulmonary disease, unspecified: Secondary | ICD-10-CM | POA: Diagnosis present

## 2021-10-16 DIAGNOSIS — R627 Adult failure to thrive: Secondary | ICD-10-CM

## 2021-10-16 DIAGNOSIS — W19XXXA Unspecified fall, initial encounter: Secondary | ICD-10-CM | POA: Diagnosis present

## 2021-10-16 DIAGNOSIS — Z419 Encounter for procedure for purposes other than remedying health state, unspecified: Secondary | ICD-10-CM

## 2021-10-16 DIAGNOSIS — I10 Essential (primary) hypertension: Secondary | ICD-10-CM

## 2021-10-16 DIAGNOSIS — S72001A Fracture of unspecified part of neck of right femur, initial encounter for closed fracture: Secondary | ICD-10-CM

## 2021-10-16 DIAGNOSIS — Z885 Allergy status to narcotic agent status: Secondary | ICD-10-CM

## 2021-10-16 LAB — COMPREHENSIVE METABOLIC PANEL
ALT: 15 U/L (ref 0–44)
AST: 21 U/L (ref 15–41)
Albumin: 3.9 g/dL (ref 3.5–5.0)
Alkaline Phosphatase: 55 U/L (ref 38–126)
Anion gap: 12 (ref 5–15)
BUN: 9 mg/dL (ref 6–20)
CO2: 20 mmol/L — ABNORMAL LOW (ref 22–32)
Calcium: 9 mg/dL (ref 8.9–10.3)
Chloride: 103 mmol/L (ref 98–111)
Creatinine, Ser: 0.34 mg/dL — ABNORMAL LOW (ref 0.44–1.00)
GFR, Estimated: >60 mL/min (ref >60)
Glucose, Bld: 103 mg/dL — ABNORMAL HIGH (ref 70–99)
Potassium: 3.3 mmol/L — ABNORMAL LOW (ref 3.5–5.1)
Sodium: 135 mmol/L (ref 135–145)
Total Bilirubin: 0.2 mg/dL — ABNORMAL LOW (ref 0.3–1.2)
Total Protein: 6.6 g/dL (ref 6.5–8.1)

## 2021-10-16 LAB — CBC WITH DIFFERENTIAL/PLATELET
Abs Immature Granulocytes: 0.05 10*3/uL (ref 0.00–0.07)
Basophils Absolute: 0.1 10*3/uL (ref 0.0–0.1)
Basophils Relative: 1 %
Eosinophils Absolute: 0.2 10*3/uL (ref 0.0–0.5)
Eosinophils Relative: 2 %
HCT: 36.7 % (ref 36.0–46.0)
Hemoglobin: 12.1 g/dL (ref 12.0–15.0)
Immature Granulocytes: 0 %
Lymphocytes Relative: 19 %
Lymphs Abs: 2.3 10*3/uL (ref 0.7–4.0)
MCH: 32.2 pg (ref 26.0–34.0)
MCHC: 33 g/dL (ref 30.0–36.0)
MCV: 97.6 fL (ref 80.0–100.0)
Monocytes Absolute: 0.9 10*3/uL (ref 0.1–1.0)
Monocytes Relative: 8 %
Neutro Abs: 8.7 10*3/uL — ABNORMAL HIGH (ref 1.7–7.7)
Neutrophils Relative %: 70 %
Platelets: 368 10*3/uL (ref 150–400)
RBC: 3.76 MIL/uL — ABNORMAL LOW (ref 3.87–5.11)
RDW: 13 % (ref 11.5–15.5)
WBC: 12.3 10*3/uL — ABNORMAL HIGH (ref 4.0–10.5)
nRBC: 0 % (ref 0.0–0.2)

## 2021-10-16 LAB — RESP PANEL BY RT-PCR (FLU A&B, COVID) ARPGX2
Influenza A by PCR: NEGATIVE
Influenza B by PCR: NEGATIVE
SARS Coronavirus 2 by RT PCR: NEGATIVE

## 2021-10-16 LAB — TYPE AND SCREEN
ABO/RH(D): O POS
Antibody Screen: NEGATIVE

## 2021-10-16 MED ORDER — MORPHINE SULFATE (PF) 2 MG/ML IV SOLN
0.5000 mg | INTRAVENOUS | Status: DC | PRN
  Administered 2021-10-17 (×3): 0.5 mg via INTRAVENOUS
  Filled 2021-10-16 (×3): qty 1

## 2021-10-16 MED ORDER — HYDROCODONE-ACETAMINOPHEN 5-325 MG PO TABS
1.0000 | ORAL_TABLET | Freq: Four times a day (QID) | ORAL | Status: DC | PRN
  Administered 2021-10-17: 1 via ORAL
  Filled 2021-10-16: qty 1

## 2021-10-16 MED ORDER — ENSURE ENLIVE PO LIQD
237.0000 mL | Freq: Two times a day (BID) | ORAL | Status: DC
  Filled 2021-10-16 (×4): qty 237

## 2021-10-16 MED ORDER — HYDROMORPHONE HCL 1 MG/ML IJ SOLN
0.5000 mg | Freq: Once | INTRAMUSCULAR | Status: AC
Start: 2021-10-16 — End: 2021-10-16
  Administered 2021-10-16: 0.5 mg via INTRAVENOUS
  Filled 2021-10-16: qty 1

## 2021-10-16 MED ORDER — ONDANSETRON HCL 4 MG/2ML IJ SOLN
4.0000 mg | Freq: Once | INTRAMUSCULAR | Status: AC
  Administered 2021-10-16: 4 mg via INTRAVENOUS
  Filled 2021-10-16: qty 2

## 2021-10-16 MED ORDER — NICOTINE 21 MG/24HR TD PT24: 21.0000 mg | MEDICATED_PATCH | Freq: Every day | TRANSDERMAL | Status: DC

## 2021-10-16 NOTE — ED Notes (Signed)
Beeped to [phone].Alvan Dame

## 2021-10-16 NOTE — ED Notes (Addendum)
[facility] at bedside to transport pt to [facility]
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
## 2021-10-16 NOTE — ED Triage Notes (Signed)
Pt c/o RT hip pain after slipping and falling getting out of the car. Pt states she is unable to bear any weight on that side. Pt sitting in wheelchair comfortably. No obvious deformity noted at this time.

## 2021-10-16 NOTE — H&P (Signed)
History and Physical  [name] NWG:[id number] DOB: [birth year] DOA: 10/16/2021  Referring physician: Milton Ferguson, MD PCP: Sandi Mariscal, MD  Patient coming from: Home  Chief Complaint: Fall and hip pain  HPI: Beth [name] is a 60 y.o. female with medical history significant for hypertension, alcohol abuse, COPD and tobacco abuse who presents to the emergency department via EMS due to right hip pain sustained after slipping of the 2-3 stairs at the back of her house, husband came to her rescue and took her to bed.  She continued to complain of right hip pain and inability to bear weight on right leg, EMS was activated and patient was taken to the ED for further evaluation and management.  She denies chest pain, shortness of breath, nausea, vomiting or abdominal pain.  ED Course:  In the emergency department, BP was soft at 108/74, but other vital signs were within normal range.  Work-up in the ED showed normal CBC except for leukocytosis, normal BMP except for hypokalemia.  Influenza A, B, SARS coronavirus 2 was negative. Chest x-ray showed no active disease Right hip x-ray showed right intratrochanteric fracture She was treated with Dilaudid and Zofran.  Orthopedic surgeon (Dr. Alvan Dame) was consulted and recommended admitting patient to [facility] and to place patient n.p.o. after midnight per ED medical record.  Hospitalist was asked to admit patient for further evaluation and management.  Review of Systems: Constitutional: Negative for chills and fever.  HENT: Negative for ear pain and sore throat.   Eyes: Negative for pain and visual disturbance.  Respiratory: Negative for cough, chest tightness and shortness of breath.   Cardiovascular: Negative for chest pain and palpitations.  Gastrointestinal: Negative for abdominal pain and vomiting.  Endocrine: Negative for polyphagia and polyuria.  Genitourinary: Negative for decreased urine volume, dysuria, enuresis Musculoskeletal:  Positive for right hip pain.  Negative for back pain.  Skin: Negative for color change and rash.  Allergic/Immunologic: Negative for immunocompromised state.  Neurological: Negative for tremors, syncope, speech difficulty, weakness, light-headedness and headaches.  Hematological: Does not bruise/bleed easily.  All other systems reviewed and are negative  Past Medical History:  Diagnosis Date   Alcohol abuse 10/26/2011   Brain bleed ([place]) 2012   Cervical cancer (HCC)    COPD (chronic obstructive pulmonary disease) (HCC)    Displaced fracture of lateral malleolus of right fibula, initial encounter for closed fracture 02/13/2021   Hypertension    Lung infection    Tobacco abuse    Past Surgical History:  Procedure Laterality Date   BIOPSY  02/04/2020   Procedure: BIOPSY;  Surgeon: Danie Binder, MD;  [facility];  Service: Endoscopy;;   BRONCHOSCOPY     COLONOSCOPY WITH PROPOFOL N/A 02/04/2020   Procedure: COLONOSCOPY WITH PROPOFOL;  Surgeon: Danie Binder, MD;  [facility]: [facility];  Service: Endoscopy;  Laterality: N/A;  8:45am   KNEE ARTHROSCOPY WITH MENISCAL REPAIR Right 08/25/2020   Procedure: KNEE ARTHROSCOPY WITH  MEDIAL MENISCAL REPAIR;  Surgeon: Carole Civil, MD;  Location: [facility];  Service: Orthopedics;  Laterality: Right;   MULTIPLE TOOTH EXTRACTIONS     ORIF ANKLE FRACTURE Right 02/14/2021   Procedure: OPEN REDUCTION INTERNAL FIXATION (ORIF) ANKLE FRACTURE;  Surgeon: [name], MD;  Location: [place];  Service: Orthopedics;  Laterality: Right;   ORIF FEMUR FRACTURE Right 02/14/2021   Procedure: OPEN REDUCTION INTERNAL FIXATION (ORIF) DISTAL FEMUR FRACTURE;  Surgeon: [name], MD;  Location: [place];  Service: Orthopedics;  Laterality: Right;   POLYPECTOMY  02/04/2020   Procedure: POLYPECTOMY;  Surgeon: Danie Binder, MD;  [facility]: [facility];  Service: Endoscopy;;   rod in arm Left     Social History:  reports that she has been smoking  cigarettes. She has a 8.75 pack-year smoking history. She has never used smokeless tobacco. She reports that she does not currently use alcohol. She reports that she does not use drugs.   Allergies  Allergen Reactions   Amoxicillin Nausea Only   Codeine Itching    Family History  Problem Relation Age of Onset   Throat cancer Mother    Lung cancer Father    Throat cancer Brother    Lung cancer Brother    Colon cancer Neg Hx      Prior to Admission medications   Medication Sig Start Date End Date Taking? Authorizing Provider  albuterol (PROVENTIL HFA;VENTOLIN HFA) 108 (90 BASE) MCG/ACT inhaler Inhale 1-2 puffs into the lungs every 6 (six) hours as needed for wheezing or shortness of breath.    [provider]  albuterol (PROVENTIL) (2.5 MG/3ML) 0.083% nebulizer solution Take 2.5 mg by nebulization every 6 (six) hours as needed for wheezing or shortness of breath.    [provider]  amLODipine (NORVASC) 5 MG tablet Take 5 mg by mouth daily. 08/14/20   [provider]  Cholecalciferol (VITAMIN D3) 125 MCG (5000 UT) TABS Take 5,000 Units by mouth daily.     [provider]  folic acid (FOLVITE) 1 MG tablet Take 1 tablet (1 mg total) by mouth daily. 02/17/21   Shelly Coss, MD  magnesium oxide (MAG-OX) 400 (241.3 Mg) MG tablet Take 1 tablet (400 mg total) by mouth daily. 02/17/21   Shelly Coss, MD  Multiple Vitamin (MULTIVITAMIN WITH MINERALS) TABS tablet Take 1 tablet by mouth daily.    [provider]  nicotine (NICODERM CQ - DOSED IN MG/24 HOURS) 14 mg/24hr patch Place 1 patch (14 mg total) onto the skin daily. 02/17/21   Shelly Coss, MD  oxycodone (OXY-IR) 5 MG capsule Take 1 capsule (5 mg total) by mouth every 6 (six) hours as needed for pain. 02/16/21   Shelly Coss, MD  polyethylene glycol (MIRALAX / GLYCOLAX) 17 g packet Take 17 g by mouth daily as needed for mild constipation. 02/16/21   Shelly Coss, MD  thiamine 100 MG tablet  Take 1 tablet (100 mg total) by mouth daily. 02/17/21   Shelly Coss, MD    Physical Exam: BP 107/76   Pulse 78   Temp 98.2 F (36.8 C) (Oral)   Resp 18   Ht 5\' 3"  (1.6 m)   Wt 37.6 kg   LMP 10/08/2019 Comment: Still spotting since November 10  SpO2 98%   BMI 14.70 kg/m   General: 60 y.o. year-old cachectic female  in no acute distress.  Alert and oriented x3. HEENT: NCAT, EOMI Neck: Supple, trachea medial Cardiovascular: Regular rate and rhythm with no rubs or gallops.  No thyromegaly or JVD noted.  No lower extremity edema. 2/4 pulses in all 4 extremities. Respiratory: Clear to auscultation with no wheezes or rales. Good inspiratory effort. Abdomen: Soft, nontender nondistended with normal bowel sounds x4 quadrants. Muskuloskeletal: Tender to palpation of right hip.  Restricted ROM of RLE due to pain.   Neuro: CN II-XII intact, sensation, reflexes intact Skin: No ulcerative lesions noted or rashes Psychiatry: Judgement and insight appear normal. Mood is appropriate for condition and setting  Labs on Admission:  Basic Metabolic Panel: Recent Labs  Lab 10/16/21 2030  NA 135  K 3.3*  CL 103  CO2 20*  GLUCOSE 103*  BUN 9  CREATININE 0.34*  CALCIUM 9.0   Liver Function Tests: Recent Labs  Lab 10/16/21 2030  AST 21  ALT 15  ALKPHOS 55  BILITOT 0.2*  PROT 6.6  ALBUMIN 3.9   No results for input(s): LIPASE, AMYLASE in the last 168 hours. No results for input(s): AMMONIA in the last 168 hours. CBC: Recent Labs  Lab 10/16/21 2030  WBC 12.3*  NEUTROABS 8.7*  HGB 12.1  HCT 36.7  MCV 97.6  PLT 368   Cardiac Enzymes: No results for input(s): CKTOTAL, CKMB, CKMBINDEX, TROPONINI in the last 168 hours.  BNP (last 3 results) No results for input(s): BNP in the last 8760 hours.  ProBNP (last 3 results) No results for input(s): PROBNP in the last 8760 hours.  CBG: No results for input(s): GLUCAP in the last 168 hours.  Radiological Exams on  Admission: Chest Portable 1 View  Result Date: 10/16/2021 CLINICAL DATA:  Right hip fracture.  Medical clearance.  COPD. EXAM: PORTABLE CHEST 1 VIEW COMPARISON:  02/13/2021 FINDINGS: The lungs are hyperinflated in keeping with changes of underlying COPD. Parenchymal scarring within the right upper lobe appears stable since prior examination dating as far back as 10/02/2011 and is better assessed on prior CT examination of 10/03/2011. No new focal pulmonary nodule or infiltrate. No pneumothorax or pleural effusion. Cardiac size within normal limits. Remote nonunited distal right clavicle fracture noted. Multiple healed right rib fractures noted. Bilateral IMPRESSION: No active disease.  COPD. Electronically Signed   By: Fidela Salisbury M.D.   On: 10/16/2021 23:07   DG HIP UNILAT WITH PELVIS 2-3 VIEWS RIGHT  Result Date: 10/16/2021 CLINICAL DATA:  Recent fall with right-sided hip pain, initial encounter EXAM: DG HIP (WITH OR WITHOUT PELVIS) 2-3V RIGHT COMPARISON:  None. FINDINGS: Intratrochanteric comminuted fracture is noted in the proximal right femur. Some impaction and angulation at the fracture site is noted. Femoral head is well seated. Pelvic ring is otherwise intact. IMPRESSION: Right intratrochanteric fracture. Electronically Signed   By: Inez Catalina M.D.   On: 10/16/2021 20:33    EKG: I independently viewed the EKG done and my findings are as followed: EKG was not done in the ED  Assessment/Plan Present on Admission:  Closed right femoral fracture (HCC)  Leukocytosis  COPD (chronic obstructive pulmonary disease) ([place])  Alcohol abuse  Tobacco abuse  Principal Problem:   Closed right femoral fracture (HCC) Active Problems:   Alcohol abuse   Essential hypertension   COPD (chronic obstructive pulmonary disease) (HCC)   Leukocytosis   Tobacco abuse   Hypokalemia   Failure to thrive in adult   Underweight due to inadequate caloric intake  Closed right femoral fracture Right hip  x-ray showed right intratrochanteric fracture Continue Norco as needed for moderate pain Continue IV morphine 0.5 mg IV every 4 hours as needed for severe pain Continue fall precaution and neurochecks Patient will be placed n.p.o. at midnight Consider PT/OT eval and treat status post surgery since patient is currently on bedrest Orthopedic surgery was consulted by ED physician and recommended admitting patient to [facility]  Leukocytosis possibly reactive WBC 12.3, no obvious sign of acute infectious process Continue to monitor CBC with morning labs  Hypokalemia K+ 3.3; this was replenished  Failure to thrive in adult Underweight possibly due to inadequate calorie intake BMI  14.70 kg/m Protein supplement to be provided  Essential hypertension BP meds will be held at this time due to soft BP   Chronic alcohol abuse Patient was counseled on alcohol use cessation Continue thiamine, folic acid and multivitamin  Tobacco abuse Patient was counseled on tobacco abuse cessation Nicotine patch will be provided  DVT prophylaxis: SCDs  Code Status: Full code  Family Communication: None at bedside  Disposition Plan:  Patient is from:                        home Anticipated DC to:                   SNF or family members home Anticipated DC date:               2-3 days Anticipated DC barriers:          Patient requires inpatient management due to right femoral fracture pending orthopedic surgery consult    Consults called: Orthopedic surgery  Admission status: Inpatient    Bernadette Hoit MD [facility]  10/16/2021, 11:49 PM

## 2021-10-16 NOTE — ED Notes (Signed)
Mali, RN from previous shift reported that he gave pt handoff report to care link and receiving RN at [employer]

## 2021-10-17 ENCOUNTER — Inpatient Hospital Stay (HOSPITAL_COMMUNITY): Payer: Medicaid Other

## 2021-10-17 ENCOUNTER — Encounter (HOSPITAL_COMMUNITY)
Admission: EM | Disposition: A | Discharge status: Home / Self Care | Payer: Self-pay | Source: Home / Self Care | Attending: Internal Medicine

## 2021-10-17 ENCOUNTER — Encounter (HOSPITAL_COMMUNITY): Payer: Self-pay | Admitting: Internal Medicine

## 2021-10-17 ENCOUNTER — Inpatient Hospital Stay (HOSPITAL_COMMUNITY): Payer: Medicaid Other | Admitting: Anesthesiology

## 2021-10-17 DIAGNOSIS — S72001A Fracture of unspecified part of neck of right femur, initial encounter for closed fracture: Secondary | ICD-10-CM | POA: Diagnosis not present

## 2021-10-17 HISTORY — PX: INTRAMEDULLARY (IM) NAIL INTERTROCHANTERIC: SHX5875

## 2021-10-17 LAB — TYPE AND SCREEN
ABO/RH(D): O POS
Antibody Screen: NEGATIVE

## 2021-10-17 LAB — CBC
HCT: 36 % (ref 36.0–46.0)
Hemoglobin: 11.8 g/dL — ABNORMAL LOW (ref 12.0–15.0)
MCH: 32 pg (ref 26.0–34.0)
MCHC: 32.8 g/dL (ref 30.0–36.0)
MCV: 97.6 fL (ref 80.0–100.0)
Platelets: 336 10*3/uL (ref 150–400)
RBC: 3.69 MIL/uL — ABNORMAL LOW (ref 3.87–5.11)
RDW: 13.1 % (ref 11.5–15.5)
WBC: 10.4 10*3/uL (ref 4.0–10.5)
nRBC: 0 % (ref 0.0–0.2)

## 2021-10-17 LAB — COMPREHENSIVE METABOLIC PANEL
ALT: 15 U/L (ref 0–44)
AST: 21 U/L (ref 15–41)
Albumin: 3.8 g/dL (ref 3.5–5.0)
Alkaline Phosphatase: 55 U/L (ref 38–126)
Anion gap: 8 (ref 5–15)
BUN: 10 mg/dL (ref 6–20)
CO2: 25 mmol/L (ref 22–32)
Calcium: 8.9 mg/dL (ref 8.9–10.3)
Chloride: 105 mmol/L (ref 98–111)
Creatinine, Ser: 0.42 mg/dL — ABNORMAL LOW (ref 0.44–1.00)
GFR, Estimated: >60 mL/min (ref >60)
Glucose, Bld: 104 mg/dL — ABNORMAL HIGH (ref 70–99)
Potassium: 3.8 mmol/L (ref 3.5–5.1)
Sodium: 138 mmol/L (ref 135–145)
Total Bilirubin: 0.8 mg/dL (ref 0.3–1.2)
Total Protein: 6.4 g/dL — ABNORMAL LOW (ref 6.5–8.1)

## 2021-10-17 LAB — SURGICAL PCR SCREEN
MRSA, PCR: NEGATIVE
Staphylococcus aureus: POSITIVE — AB

## 2021-10-17 LAB — PHOSPHORUS: Phosphorus: 3.5 mg/dL (ref 2.5–4.6)

## 2021-10-17 LAB — MAGNESIUM: Magnesium: 1.7 mg/dL (ref 1.7–2.4)

## 2021-10-17 SURGERY — FIXATION, FRACTURE, INTERTROCHANTERIC, WITH INTRAMEDULLARY ROD
Anesthesia: Spinal | Site: Hip | Laterality: Right

## 2021-10-17 MED ORDER — HYDROCODONE-ACETAMINOPHEN 7.5-325 MG PO TABS: 1.0000 | ORAL_TABLET | ORAL | Status: DC | PRN

## 2021-10-17 MED ORDER — CHLORHEXIDINE GLUCONATE CLOTH 2 % EX PADS: 6.0000 | MEDICATED_PAD | Freq: Every day | CUTANEOUS | Status: DC

## 2021-10-17 MED ORDER — DIPHENHYDRAMINE HCL 25 MG PO CAPS
25.0000 mg | ORAL_CAPSULE | Freq: Four times a day (QID) | ORAL | Status: DC | PRN
  Administered 2021-10-17: 25 mg via ORAL
  Filled 2021-10-17: qty 1

## 2021-10-17 MED ORDER — SODIUM CHLORIDE 0.9 % IV SOLN: INTRAVENOUS | Status: DC | PRN

## 2021-10-17 MED ORDER — FENTANYL CITRATE (PF) 250 MCG/5ML IJ SOLN
INTRAMUSCULAR | Status: AC
  Filled 2021-10-17: qty 5

## 2021-10-17 MED ORDER — ONDANSETRON HCL 4 MG PO TABS: 4.0000 mg | ORAL_TABLET | Freq: Four times a day (QID) | ORAL | Status: DC | PRN

## 2021-10-17 MED ORDER — ONDANSETRON HCL 4 MG/2ML IJ SOLN: 4.0000 mg | Freq: Once | INTRAMUSCULAR | Status: DC | PRN

## 2021-10-17 MED ORDER — PHENOL 1.4 % MT LIQD: 1.0000 | OROMUCOSAL | Status: DC | PRN

## 2021-10-17 MED ORDER — DOCUSATE SODIUM 100 MG PO CAPS
100.0000 mg | ORAL_CAPSULE | Freq: Two times a day (BID) | ORAL | Status: DC
  Administered 2021-10-17 – 2021-10-18 (×2): 100 mg via ORAL
  Filled 2021-10-17 (×2): qty 1

## 2021-10-17 MED ORDER — POVIDONE-IODINE 10 % EX SWAB: 2.0000 "application " | Freq: Once | CUTANEOUS | Status: DC

## 2021-10-17 MED ORDER — LACTATED RINGERS IV SOLN: INTRAVENOUS | Status: DC | PRN

## 2021-10-17 MED ORDER — NICOTINE 21 MG/24HR TD PT24
21.0000 mg | MEDICATED_PATCH | Freq: Every day | TRANSDERMAL | Status: DC
  Administered 2021-10-18: 21 mg via TRANSDERMAL
  Filled 2021-10-17: qty 1

## 2021-10-17 MED ORDER — METOCLOPRAMIDE HCL 5 MG PO TABS: 5.0000 mg | ORAL_TABLET | Freq: Three times a day (TID) | ORAL | Status: DC | PRN

## 2021-10-17 MED ORDER — BUPIVACAINE HCL (PF) 0.5 % IJ SOLN
INTRAMUSCULAR | Status: DC | PRN
  Administered 2021-10-17: 11 mg via INTRATHECAL

## 2021-10-17 MED ORDER — CEFAZOLIN SODIUM-DEXTROSE 2-4 GM/100ML-% IV SOLN
INTRAVENOUS | Status: AC
  Filled 2021-10-17: qty 100

## 2021-10-17 MED ORDER — ACETAMINOPHEN 325 MG PO TABS: 325.0000 mg | ORAL_TABLET | Freq: Four times a day (QID) | ORAL | Status: DC | PRN

## 2021-10-17 MED ORDER — ASPIRIN EC 81 MG PO TBEC
81.0000 mg | DELAYED_RELEASE_TABLET | Freq: Two times a day (BID) | ORAL | Status: DC
  Administered 2021-10-17 – 2021-10-18 (×2): 81 mg via ORAL
  Filled 2021-10-17 (×2): qty 1

## 2021-10-17 MED ORDER — CHLORHEXIDINE GLUCONATE 4 % EX LIQD: 60.0000 mL | Freq: Once | CUTANEOUS | Status: DC

## 2021-10-17 MED ORDER — MORPHINE SULFATE (PF) 4 MG/ML IV SOLN: 0.5000 mg | INTRAVENOUS | Status: DC | PRN

## 2021-10-17 MED ORDER — HYDROCODONE-ACETAMINOPHEN 5-325 MG PO TABS
1.0000 | ORAL_TABLET | ORAL | Status: DC | PRN
  Administered 2021-10-17: 1 via ORAL
  Administered 2021-10-18 (×2): 2 via ORAL
  Filled 2021-10-17 (×2): qty 2
  Filled 2021-10-17: qty 1

## 2021-10-17 MED ORDER — MIDAZOLAM HCL 2 MG/2ML IJ SOLN
INTRAMUSCULAR | Status: AC
  Filled 2021-10-17: qty 2

## 2021-10-17 MED ORDER — METHOCARBAMOL 500 MG IVPB - SIMPLE MED
500.0000 mg | Freq: Four times a day (QID) | INTRAVENOUS | Status: DC | PRN
  Filled 2021-10-17: qty 50

## 2021-10-17 MED ORDER — MUPIROCIN 2 % EX OINT
1.0000 "application " | TOPICAL_OINTMENT | Freq: Two times a day (BID) | CUTANEOUS | Status: DC
  Administered 2021-10-17 – 2021-10-18 (×2): 1 via NASAL
  Filled 2021-10-17: qty 22

## 2021-10-17 MED ORDER — METOCLOPRAMIDE HCL 5 MG/ML IJ SOLN: 5.0000 mg | Freq: Three times a day (TID) | INTRAMUSCULAR | Status: DC | PRN

## 2021-10-17 MED ORDER — ADULT MULTIVITAMIN W/MINERALS CH
1.0000 | ORAL_TABLET | Freq: Every day | ORAL | Status: DC
  Administered 2021-10-18: 1 via ORAL
  Filled 2021-10-17: qty 1

## 2021-10-17 MED ORDER — PROPOFOL 500 MG/50ML IV EMUL
INTRAVENOUS | Status: DC | PRN
  Administered 2021-10-17: 75 ug/kg/min via INTRAVENOUS

## 2021-10-17 MED ORDER — PROPOFOL 10 MG/ML IV BOLUS
INTRAVENOUS | Status: DC | PRN
  Administered 2021-10-17 (×2): 30 mg via INTRAVENOUS

## 2021-10-17 MED ORDER — FENTANYL CITRATE (PF) 100 MCG/2ML IJ SOLN
INTRAMUSCULAR | Status: DC | PRN
  Administered 2021-10-17: 50 ug via INTRAVENOUS

## 2021-10-17 MED ORDER — PHENYLEPHRINE HCL (PRESSORS) 10 MG/ML IV SOLN
INTRAVENOUS | Status: AC
  Filled 2021-10-17: qty 2

## 2021-10-17 MED ORDER — NICOTINE 14 MG/24HR TD PT24: 14.0000 mg | MEDICATED_PATCH | Freq: Every day | TRANSDERMAL | Status: DC

## 2021-10-17 MED ORDER — TRANEXAMIC ACID-NACL 1000-0.7 MG/100ML-% IV SOLN
INTRAVENOUS | Status: AC
  Filled 2021-10-17: qty 100

## 2021-10-17 MED ORDER — PHENYLEPHRINE HCL-NACL 20-0.9 MG/250ML-% IV SOLN
INTRAVENOUS | Status: DC | PRN
  Administered 2021-10-17: 50 ug/min via INTRAVENOUS

## 2021-10-17 MED ORDER — TRANEXAMIC ACID-NACL 1000-0.7 MG/100ML-% IV SOLN
1000.0000 mg | Freq: Once | INTRAVENOUS | Status: AC
  Administered 2021-10-17: 1000 mg via INTRAVENOUS
  Filled 2021-10-17: qty 100

## 2021-10-17 MED ORDER — FOLIC ACID 1 MG PO TABS
1.0000 mg | ORAL_TABLET | Freq: Every day | ORAL | Status: DC
  Administered 2021-10-18: 1 mg via ORAL
  Filled 2021-10-17: qty 1

## 2021-10-17 MED ORDER — MIDAZOLAM HCL 5 MG/5ML IJ SOLN
INTRAMUSCULAR | Status: DC | PRN
  Administered 2021-10-17 (×2): 1 mg via INTRAVENOUS

## 2021-10-17 MED ORDER — FENTANYL CITRATE PF 50 MCG/ML IJ SOSY: 25.0000 ug | PREFILLED_SYRINGE | INTRAMUSCULAR | Status: DC | PRN

## 2021-10-17 MED ORDER — ACETAMINOPHEN 10 MG/ML IV SOLN: 1000.0000 mg | Freq: Once | INTRAVENOUS | Status: DC | PRN

## 2021-10-17 MED ORDER — DEXMEDETOMIDINE (PRECEDEX) IN NS 20 MCG/5ML (4 MCG/ML) IV SYRINGE
PREFILLED_SYRINGE | INTRAVENOUS | Status: DC | PRN
  Administered 2021-10-17: 4 ug via INTRAVENOUS

## 2021-10-17 MED ORDER — CEFAZOLIN SODIUM-DEXTROSE 2-4 GM/100ML-% IV SOLN
2.0000 g | INTRAVENOUS | Status: AC
  Administered 2021-10-17: 2 g via INTRAVENOUS

## 2021-10-17 MED ORDER — CEFAZOLIN SODIUM-DEXTROSE 2-4 GM/100ML-% IV SOLN
2.0000 g | Freq: Four times a day (QID) | INTRAVENOUS | Status: AC
  Administered 2021-10-17 – 2021-10-18 (×2): 2 g via INTRAVENOUS
  Filled 2021-10-17 (×2): qty 100

## 2021-10-17 MED ORDER — THIAMINE HCL 100 MG PO TABS
100.0000 mg | ORAL_TABLET | Freq: Every day | ORAL | Status: DC
  Administered 2021-10-18: 100 mg via ORAL
  Filled 2021-10-17: qty 1

## 2021-10-17 MED ORDER — MENTHOL 3 MG MT LOZG: 1.0000 | LOZENGE | OROMUCOSAL | Status: DC | PRN

## 2021-10-17 MED ORDER — METHOCARBAMOL 500 MG PO TABS
500.0000 mg | ORAL_TABLET | Freq: Four times a day (QID) | ORAL | Status: DC | PRN
  Administered 2021-10-18 (×2): 500 mg via ORAL
  Filled 2021-10-17 (×3): qty 1

## 2021-10-17 MED ORDER — POLYETHYLENE GLYCOL 3350 17 G PO PACK: 17.0000 g | PACK | Freq: Every day | ORAL | Status: DC | PRN

## 2021-10-17 MED ORDER — ONDANSETRON HCL 4 MG/2ML IJ SOLN: 4.0000 mg | Freq: Four times a day (QID) | INTRAMUSCULAR | Status: DC | PRN

## 2021-10-17 MED ORDER — SODIUM CHLORIDE 0.9 % IV SOLN: INTRAVENOUS | Status: DC

## 2021-10-17 MED ORDER — TRANEXAMIC ACID-NACL 1000-0.7 MG/100ML-% IV SOLN
1000.0000 mg | INTRAVENOUS | Status: AC
  Administered 2021-10-17: 1000 mg via INTRAVENOUS

## 2021-10-17 MED ORDER — 0.9 % SODIUM CHLORIDE (POUR BTL) OPTIME
TOPICAL | Status: DC | PRN
  Administered 2021-10-17: 1000 mL

## 2021-10-17 SURGICAL SUPPLY — 28 items
BAG COUNTER SPONGE SURGICOUNT (BAG) IMPLANT
BAG ZIPLOCK 12X15 (MISCELLANEOUS) ×2 IMPLANT
BIT DRILL CANN LG 4.3MM (BIT) IMPLANT
COVER PERINEAL POST (MISCELLANEOUS) ×2 IMPLANT
COVER SURGICAL LIGHT HANDLE (MISCELLANEOUS) ×2 IMPLANT
DRAPE STERI IOBAN 125X83 (DRAPES) ×2 IMPLANT
DRILL BIT CANN LG 4.3MM (BIT) ×2
DRSG AQUACEL AG ADV 3.5X 4 (GAUZE/BANDAGES/DRESSINGS) ×4 IMPLANT
DRSG AQUACEL AG ADV 3.5X 6 (GAUZE/BANDAGES/DRESSINGS) ×3 IMPLANT
DURAPREP 26ML APPLICATOR (WOUND CARE) ×2 IMPLANT
ELECT REM PT RETURN 15FT ADLT (MISCELLANEOUS) ×2 IMPLANT
GLOVE SURG UNDER POLY LF SZ7.5 (GLOVE) ×4 IMPLANT
GOWN STRL REUS W/TWL LRG LVL3 (GOWN DISPOSABLE) ×2 IMPLANT
GUIDEPIN VERSANAIL DSP 3.2X444 (ORTHOPEDIC DISPOSABLE SUPPLIES) ×1 IMPLANT
KIT BASIN OR (CUSTOM PROCEDURE TRAY) ×2 IMPLANT
KIT TURNOVER KIT A (KITS) IMPLANT
MANIFOLD NEPTUNE II (INSTRUMENTS) ×2 IMPLANT
NAIL HIP FRACT 130D 11X180 (Screw) ×1 IMPLANT
PACK GENERAL/GYN (CUSTOM PROCEDURE TRAY) ×2 IMPLANT
PADDING CAST COTTON 6X4 STRL (CAST SUPPLIES) ×2 IMPLANT
PROTECTOR NERVE ULNAR (MISCELLANEOUS) ×2 IMPLANT
SCREW BONE CORTICAL 5.0X3 (Screw) ×1 IMPLANT
SCREW LAG HIP NAIL 10.5X95 (Screw) ×1 IMPLANT
SUT VIC AB 1 CT1 36 (SUTURE) ×2 IMPLANT
SUT VIC AB 2-0 CT1 27 (SUTURE) ×2
SUT VIC AB 2-0 CT1 27XBRD (SUTURE) ×2 IMPLANT
TOWEL OR 17X26 10 PK STRL BLUE (TOWEL DISPOSABLE) ×2 IMPLANT
TOWEL OR NON WOVEN STRL DISP B (DISPOSABLE) ×2 IMPLANT

## 2021-10-17 NOTE — Transfer of Care (Signed)
Immediate Anesthesia Transfer of Care Note  Patient: Beth [name]  Procedure(s) Performed: INTRAMEDULLARY (IM) NAIL INTERTROCHANTRIC (Right: Hip)  Patient Location: PACU  Anesthesia Type:Spinal  Level of Consciousness: awake, alert , oriented and patient cooperative  Airway & Oxygen Therapy: Patient Spontanous Breathing and Patient connected to face mask oxygen  Post-op Assessment: Report given to RN and Post -op Vital signs reviewed and stable  Post vital signs: stable  Last Vitals:  Vitals Value Taken Time  BP 93/47 10/17/21 1616  Temp 36.4 C 10/17/21 1613  Pulse 80 10/17/21 1623  Resp 20 10/17/21 1623  SpO2 94 % 10/17/21 1623  Vitals shown include unvalidated device data.  Last Pain:  Vitals:   10/17/21 1615  TempSrc:   PainSc: 0-No pain      Patients Stated Pain Goal: 4 (60/47/99 8721)  Complications: No notable events documented.

## 2021-10-17 NOTE — Anesthesia Procedure Notes (Signed)
Spinal  Patient location during procedure: OR Reason for block: surgical anesthesia Staffing Performed: anesthesiologist  Anesthesiologist: Barnet Glasgow, MD Preanesthetic Checklist Completed: patient identified, IV checked, risks and benefits discussed, surgical consent, monitors and equipment checked, pre-op evaluation and timeout performed Spinal Block Patient position: left lateral decubitus Prep: DuraPrep and site prepped and draped Patient monitoring: heart rate, cardiac monitor, continuous pulse ox and blood pressure Approach: midline Location: L3-4 Injection technique: single-shot Needle Needle type: Pencan  Needle gauge: 24 G Needle length: 10 cm Needle insertion depth: 5 cm Assessment Sensory level: T4 Events: CSF return Additional Notes  1 Attempt (s). Pt tolerated procedure well.

## 2021-10-17 NOTE — Interval H&P Note (Signed)
History and Physical Interval Note:  10/17/2021 3:04 PM  Beth [name]  has presented today for surgery, with the diagnosis of RIGHT HIP FRACTURE.  The various methods of treatment have been discussed with the patient and family. After consideration of risks, benefits and other options for treatment, the patient has consented to  Procedure(s): INTRAMEDULLARY (IM) NAIL INTERTROCHANTRIC (Right) as a surgical intervention.  The patient's history has been reviewed, patient examined, no change in status, stable for surgery.  I have reviewed the patient's chart and labs.  Questions were answered to the patient's satisfaction.     Mauri Pole

## 2021-10-17 NOTE — Anesthesia Postprocedure Evaluation (Signed)
Anesthesia Post Note  Patient: Beth [name]  Procedure(s) Performed: INTRAMEDULLARY (IM) NAIL INTERTROCHANTRIC (Right: Hip)     Patient location during evaluation: Nursing Unit Anesthesia Type: Spinal Level of consciousness: oriented and awake and alert Pain management: pain level controlled Vital Signs Assessment: post-procedure vital signs reviewed and stable Respiratory status: spontaneous breathing and respiratory function stable Cardiovascular status: blood pressure returned to baseline and stable Postop Assessment: no headache, no backache, no apparent nausea or vomiting and patient able to bend at knees Anesthetic complications: no   No notable events documented.  Last Vitals:  Vitals:   10/17/21 1630 10/17/21 1645  BP: 97/75 105/65  Pulse:  75  Resp: 20 17  Temp:    SpO2: 93% 96%    Last Pain:  Vitals:   10/17/21 1645  TempSrc:   PainSc: 0-No pain                 Barnet Glasgow

## 2021-10-17 NOTE — Brief Op Note (Signed)
10/16/2021 - 10/17/2021  3:04 PM  PATIENT:  Beth [name]  60 y.o. female  PRE-OPERATIVE DIAGNOSIS:  RIGHT comminuted intertrochanteric HIP FRACTURE  POST-OPERATIVE DIAGNOSIS:  RIGHT comminuted intertrochanteric HIP FRACTURE  PROCEDURE:  Procedure(s): INTRAMEDULLARY (IM) NAIL INTERTROCHANTRIC (Right)  SURGEON:  Surgeon(s) and Role:    * [name], MD - Primary  PHYSICIAN ASSISTANT: None  ANESTHESIA:   spinal  EBL:  <20 cc  BLOOD ADMINISTERED:none  DRAINS: none   LOCAL MEDICATIONS USED:  NONE  SPECIMEN:  No Specimen  DISPOSITION OF SPECIMEN:  N/A  COUNTS:  YES  TOURNIQUET:  * No tourniquets in log *  DICTATION: .Other Dictation: Dictation Number [id number]  PLAN OF CARE: Admit to inpatient   PATIENT DISPOSITION:  PACU - hemodynamically stable.   Delay start of Pharmacological VTE agent (>24hrs) due to surgical blood loss or risk of bleeding: no

## 2021-10-17 NOTE — Anesthesia Procedure Notes (Signed)
Procedure Name: MAC Date/Time: 10/17/2021 3:05 PM Performed by: Lissa Morales, CRNA Pre-anesthesia Checklist: Patient identified, Emergency Drugs available, Suction available and Patient being monitored Patient Re-evaluated:Patient Re-evaluated prior to induction Oxygen Delivery Method: Simple face mask Placement Confirmation: positive ETCO2

## 2021-10-17 NOTE — H&P (View-Only) (Signed)
Reason for Consult: right hip fracture Referring Physician: Florencia Reasons, MD (Hospitalist)  Beth [name] is an 60 y.o. female.  HPI: SALSABEEL [name] is a 60 y.o. female with medical history significant for hypertension, alcohol abuse, COPD and tobacco abuse who presents to the emergency department via EMS due to right hip pain sustained after slipping of the 2-3 stairs at the back of her house, husband came to her rescue and took her to bed.  She tells me this am that she tripped over a throw rug in the house?? She continued to complain of right hip pain and inability to bear weight on right leg, EMS was activated and patient was taken to the ED for further evaluation and management.  She denies chest pain, shortness of breath, nausea, vomiting or abdominal pain. She denies any other musculoskeletal related issues    Past Medical History:  Diagnosis Date   Alcohol abuse 10/26/2011   Brain bleed ([place]) 2012   Cervical cancer (HCC)    COPD (chronic obstructive pulmonary disease) (HCC)    Displaced fracture of lateral malleolus of right fibula, initial encounter for closed fracture 02/13/2021   Hypertension    Lung infection    Tobacco abuse     Past Surgical History:  Procedure Laterality Date   BIOPSY  02/04/2020   Procedure: BIOPSY;  Surgeon: Danie Binder, MD;  [facility];  Service: Endoscopy;;   BRONCHOSCOPY     COLONOSCOPY WITH PROPOFOL N/A 02/04/2020   Procedure: COLONOSCOPY WITH PROPOFOL;  Surgeon: Danie Binder, MD;  [facility];  Service: Endoscopy;  Laterality: N/A;  8:45am   KNEE ARTHROSCOPY WITH MENISCAL REPAIR Right 08/25/2020   Procedure: KNEE ARTHROSCOPY WITH  MEDIAL MENISCAL REPAIR;  Surgeon: Carole Civil, MD;  Location: [facility];  Service: Orthopedics;  Laterality: Right;   MULTIPLE TOOTH EXTRACTIONS     ORIF ANKLE FRACTURE Right 02/14/2021   Procedure: OPEN REDUCTION INTERNAL FIXATION (ORIF) ANKLE FRACTURE;  Surgeon: [name], MD;  Location:  [facility];  Service: Orthopedics;  Laterality: Right;   ORIF FEMUR FRACTURE Right 02/14/2021   Procedure: OPEN REDUCTION INTERNAL FIXATION (ORIF) DISTAL FEMUR FRACTURE;  Surgeon: [name], MD;  Location: [facility];  Service: Orthopedics;  Laterality: Right;   POLYPECTOMY  02/04/2020   Procedure: POLYPECTOMY;  Surgeon: Danie Binder, MD;  [facility];  Service: Endoscopy;;   rod in arm Left     Family History  Problem Relation Age of Onset   Throat cancer Mother    Lung cancer Father    Throat cancer Brother    Lung cancer Brother    Colon cancer Neg Hx     Social History:  reports that she has been smoking cigarettes. She has a 8.75 pack-year smoking history. She has never used smokeless tobacco. She reports that she does not currently use alcohol. She reports that she does not use drugs.  Allergies:  Allergies  Allergen Reactions   Amoxicillin Nausea Only   Codeine Itching    Medications: I have reviewed the patient's current medications. Scheduled:  feeding supplement  237 mL Oral BID BM   folic acid  1 mg Oral Daily   multivitamin with minerals  1 tablet Oral Daily   nicotine  14 mg Transdermal Daily   thiamine  100 mg Oral Daily    Results for orders placed or performed during the hospital encounter of 10/16/21 (from the past 24 hour(s))  CBC  with Differential/Platelet     Status: Abnormal   Collection Time: 10/16/21  8:30 PM  Result Value Ref Range   WBC 12.3 (H) 4.0 - 10.5 K/uL   RBC 3.76 (L) 3.87 - 5.11 MIL/uL   Hemoglobin 12.1 12.0 - 15.0 g/dL   HCT 36.7 36.0 - 46.0 %   MCV 97.6 80.0 - 100.0 fL   MCH 32.2 26.0 - 34.0 pg   MCHC 33.0 30.0 - 36.0 g/dL   RDW 13.0 11.5 - 15.5 %   Platelets 368 150 - 400 K/uL   nRBC 0.0 0.0 - 0.2 %   Neutrophils Relative % 70 %   Neutro Abs 8.7 (H) 1.7 - 7.7 K/uL   Lymphocytes Relative 19 %   Lymphs Abs 2.3 0.7 - 4.0 K/uL   Monocytes Relative 8 %   Monocytes Absolute 0.9 0.1 - 1.0 K/uL   Eosinophils Relative 2 %    Eosinophils Absolute 0.2 0.0 - 0.5 K/uL   Basophils Relative 1 %   Basophils Absolute 0.1 0.0 - 0.1 K/uL   Immature Granulocytes 0 %   Abs Immature Granulocytes 0.05 0.00 - 0.07 K/uL  Comprehensive metabolic panel     Status: Abnormal   Collection Time: 10/16/21  8:30 PM  Result Value Ref Range   Sodium 135 135 - 145 mmol/L   Potassium 3.3 (L) 3.5 - 5.1 mmol/L   Chloride 103 98 - 111 mmol/L   CO2 20 (L) 22 - 32 mmol/L   Glucose, Bld 103 (H) 70 - 99 mg/dL   BUN 9 6 - 20 mg/dL   Creatinine, Ser 0.34 (L) 0.44 - 1.00 mg/dL   Calcium 9.0 8.9 - 10.3 mg/dL   Total Protein 6.6 6.5 - 8.1 g/dL   Albumin 3.9 3.5 - 5.0 g/dL   AST 21 15 - 41 U/L   ALT 15 0 - 44 U/L   Alkaline Phosphatase 55 38 - 126 U/L   Total Bilirubin 0.2 (L) 0.3 - 1.2 mg/dL   GFR, Estimated >60 >60 mL/min   Anion gap 12 5 - 15  Type and screen     Status: None   Collection Time: 10/16/21  9:19 PM  Result Value Ref Range   ABO/RH(D) O POS    Antibody Screen NEG    Sample Expiration      10/19/2021,2359 Performed at [facility], [address], [place], [place] [address]   Resp Panel by RT-PCR (Flu A&B, Covid) Nasopharyngeal Swab     Status: None   Collection Time: 10/16/21  9:43 PM   Specimen: Nasopharyngeal Swab; Nasopharyngeal(NP) swabs in vial transport medium  Result Value Ref Range   SARS Coronavirus 2 by RT PCR NEGATIVE NEGATIVE   Influenza A by PCR NEGATIVE NEGATIVE   Influenza B by PCR NEGATIVE NEGATIVE  Comprehensive metabolic panel     Status: Abnormal   Collection Time: 10/17/21  3:35 AM  Result Value Ref Range   Sodium 138 135 - 145 mmol/L   Potassium 3.8 3.5 - 5.1 mmol/L   Chloride 105 98 - 111 mmol/L   CO2 25 22 - 32 mmol/L   Glucose, Bld 104 (H) 70 - 99 mg/dL   BUN 10 6 - 20 mg/dL   Creatinine, Ser 0.42 (L) 0.44 - 1.00 mg/dL   Calcium 8.9 8.9 - 10.3 mg/dL   Total Protein 6.4 (L) 6.5 - 8.1 g/dL   Albumin 3.8 3.5 - 5.0 g/dL   AST 21 15 - 41 U/L   ALT  15 0 - 44 U/L   Alkaline Phosphatase 55 38  - 126 U/L   Total Bilirubin 0.8 0.3 - 1.2 mg/dL   GFR, Estimated >60 >60 mL/min   Anion gap 8 5 - 15  CBC     Status: Abnormal   Collection Time: 10/17/21  3:35 AM  Result Value Ref Range   WBC 10.4 4.0 - 10.5 K/uL   RBC 3.69 (L) 3.87 - 5.11 MIL/uL   Hemoglobin 11.8 (L) 12.0 - 15.0 g/dL   HCT 36.0 36.0 - 46.0 %   MCV 97.6 80.0 - 100.0 fL   MCH 32.0 26.0 - 34.0 pg   MCHC 32.8 30.0 - 36.0 g/dL   RDW 13.1 11.5 - 15.5 %   Platelets 336 150 - 400 K/uL   nRBC 0.0 0.0 - 0.2 %  Magnesium     Status: None   Collection Time: 10/17/21  3:35 AM  Result Value Ref Range   Magnesium 1.7 1.7 - 2.4 mg/dL  Phosphorus     Status: None   Collection Time: 10/17/21  3:35 AM  Result Value Ref Range   Phosphorus 3.5 2.5 - 4.6 mg/dL    X-ray: CLINICAL DATA:  Recent fall with right-sided hip pain, initial encounter   EXAM: DG HIP (WITH OR WITHOUT PELVIS) 2-3V RIGHT   COMPARISON:  None.   FINDINGS: Intratrochanteric comminuted fracture is noted in the proximal right femur. Some impaction and angulation at the fracture site is noted. Femoral head is well seated. Pelvic ring is otherwise intact.   IMPRESSION: Right intratrochanteric fracture.     Electronically Signed   By: Inez Catalina M.D.  ROS: As noted in HPI  Blood pressure 120/69, pulse 79, temperature 98.8 F (37.1 C), resp. rate 16, height 5\' 3"  (1.6 m), weight 37.6 kg, last menstrual period 10/08/2019, SpO2 99 %.  Physical Exam: General: 60 y.o. year-old cachectic female  in no acute distress.  Alert and oriented x3. HEENT: NCAT, EOMI Neck: Supple, trachea medial Cardiovascular: Regular rate and rhythm with no rubs or gallops.  No thyromegaly or JVD noted.  No lower extremity edema. 2/4 pulses in all 4 extremities. Respiratory: Clear to auscultation with no wheezes or rales. Good inspiratory effort. Abdomen: Soft, nontender nondistended with normal bowel sounds x4 quadrants. Muskuloskeletal: Tender to palpation of right hip.   Restricted ROM of RLE due to pain.  Shortening and ER of right lower extremity Neuro: CN II-XII intact, sensation, reflexes intact Skin: No ulcerative lesions noted or rashes Psychiatry: Judgement and insight appear normal. Mood is appropriate for condition and setting    Assessment/Plan: Right intertrochanteric hip fracture  Plan: I reviewed with the injury to the right hip and the need for surgical management I will try to get this addressed today NPO Consent and orders placed Reviewed risks of infection, malunion, non union and need for further surgeries Reviewed post op course and expectations with hope of home discharge after a couple days of therapy  Mauri Pole 10/17/2021, 6:56 AM

## 2021-10-17 NOTE — Anesthesia Preprocedure Evaluation (Addendum)
Anesthesia Evaluation  Patient identified by MRN, date of birth, ID band Patient awake    Reviewed: Allergy & Precautions, NPO status , Patient's Chart, lab work & pertinent test results  Airway Mallampati: I  TM Distance: >3 FB Neck ROM: Full    Dental no notable dental hx. (+) Edentulous Upper, Edentulous Lower   Pulmonary COPD,  COPD inhaler, Current Smoker and Patient abstained from smoking.,    Pulmonary exam normal breath sounds clear to auscultation       Cardiovascular hypertension, Pt. on medications Normal cardiovascular exam Rhythm:Regular Rate:Normal     Neuro/Psych    GI/Hepatic (+)     substance abuse  alcohol use,   Endo/Other  negative endocrine ROS  Renal/GU negative Renal ROSLab Results      Component                Value               Date                      CREATININE               0.42 (L)            10/17/2021                BUN                      10                  10/17/2021                NA                       138                 10/17/2021                K                        3.8                 10/17/2021                CL                       105                 10/17/2021                CO2                      25                  10/17/2021                Musculoskeletal   Abdominal   Peds  Hematology Lab Results      Component                Value               Date                      WBC  10.4                10/17/2021                HGB                      11.8 (L)            10/17/2021                HCT                      36.0                10/17/2021                MCV                      97.6                10/17/2021                PLT                      336                 10/17/2021              Anesthesia Other Findings   Reproductive/Obstetrics                            Anesthesia Physical Anesthesia  Plan  ASA: 3 and emergent  Anesthesia Plan: Spinal   Post-op Pain Management: Minimal or no pain anticipated   Induction:   PONV Risk Score and Plan: 1 and Treatment may vary due to age or medical condition and Midazolam  Airway Management Planned: Nasal Cannula and Natural Airway  Additional Equipment: None  Intra-op Plan:   Post-operative Plan:   Informed Consent: I have reviewed the patients History and Physical, chart, labs and discussed the procedure including the risks, benefits and alternatives for the proposed anesthesia with the patient or authorized representative who has indicated his/her understanding and acceptance.     Dental advisory given  Plan Discussed with: CRNA  Anesthesia Plan Comments: (R HIP fx under spinal)       Anesthesia Quick Evaluation

## 2021-10-17 NOTE — Op Note (Signed)
NAME: Beth [name], Beth [name]. MEDICAL RECORD NO: [id number] ACCOUNT NO: [id number] DATE OF BIRTH: 02-Jun-1961 FACILITY: [id number] PHYSICIAN: Pietro Cassis. Alvan Dame, MD  Operative Report   DATE OF PROCEDURE: 10/17/2021  PREOPERATIVE DIAGNOSIS:  Comminuted right intertrochanteric hip fracture.  POSTOPERATIVE DIAGNOSIS:  Comminuted right intertrochanteric hip fracture.  PROCEDURES:  Closed reduction intramedullary nailing, open reduction and internal fixation of right hip utilizing a Biomet Affixus nail measuring 11 x 180 mm with a 130-degree lag screw at 95 mm and a distal interlock.  SURGEON:  Pietro Cassis. Alvan Dame, MD  ASSISTANT:  Surgical team.  ANESTHESIA:  Spinal.  BLOOD LOSS:  Less than 20 mL.  DRAINS:  None.  COMPLICATIONS:  None.  INDICATIONS FOR PROCEDURE:  The patient is a 60 year old female who had a reported fall at home, either fallen out on a couple of steps or tripping over a throw rug depending on the story.  Nonetheless, she fell on her right hip and had immediate onset  of pain with inability to bear weight.  She was brought to the emergency room at [facility].  I was on call for that hospital covering.  She was transferred to [facility] upon identifying a comminuted hip fracture.  We reviewed the necessity of  the surgical procedure in order to maintain the anatomy as well as to get healing and pain control.  Risks of infection, nonunion, malunion, and need for future surgeries were reviewed.  Consent was obtained for benefit of pain relief.  DESCRIPTION OF PROCEDURE:  The patient was brought to the operative theater.  Once adequate anesthesia, preoperative antibiotics, Ancef administered, she was positioned supine on the fracture table.  Her all bony prominences were carefully padded and  protected.  Her left unaffected extremity was flexed and abducted with her leg on the leg holder, again with good padding protecting the peroneal nerve laterally.  Her right foot was  placed into the traction boot.  Once she was safely positioned,  fluoroscopy was brought to the field.  With traction and internal rotation, I was able to reduce the fracture into a near anatomic position.  Once this was done, the right hip was prepped and draped from the iliac crest to the knee using a shower curtain  technique.  A timeout was performed identifying the patient, planned procedure, and extremity.  Fluoroscopy was brought back to the field.  Landmarks were identified over the tip of the trochanter.  An incision was then made proximal to this.  Soft  tissue was incised through the gluteal fascia.  A guidewire was then inserted into the tip of the trochanter and passed across the fracture site, confirmed radiographically.  The proximal femur was then drilled.  I then passed the 11 x 180 mm nail by  hand to its appropriate depth.  Then, using the insertion guide for the lag screw, I placed lag screw into slightly inferior, but center of the femoral head on the lateral view.  We measured and selected a 95 mm lag screw.  I drilled for this.  The lag  screw was then passed.  Once I had it at its appropriate depth, we took traction off the lower extremity and used a compression wheel to medialize the shaft to the fracture.  Once this was done, I tightened the locking bolt proximally and then backed it  off a quarter of a turn to allow for further compression.  Once this was done, a distal interlock was placed through the insertion  jig.  The jig was then removed.  Final radiographs were obtained.  The wounds were irrigated with normal saline solution.   There was no significant hemostasis necessary.  Proximally, I reapproximated the gluteal fascia.  The remainder of the wound was closed with 2-0 Vicryl and a running Monocryl. On the distal incisions, 2-0 Vicryl was used on the skin.  The skin was  cleaned, dried, and dressed sterilely using surgical glue and Aquacel dressings.  She was then brought to  the recovery room in stable condition tolerating the procedure well.  Postoperatively, related to her bone quality, she will be partial weightbearing for likely 6 weeks.  After her discharge from the hospital, I will plan to see her back in the office in 2 weeks for wound evaluation and radiographic assessment.   ROH [name]: 10/17/2021 4:16:09 pm T: 10/17/2021 7:39:00 pm  JOB: [id number]/ [id number]

## 2021-10-17 NOTE — Progress Notes (Signed)
PROGRESS NOTE    [name]  FBP:[id number] DOB: 16-Jan-1961 DOA: 10/16/2021 PCP: Sandi Mariscal, MD    Chief Complaint  Patient presents with   Hip Pain    Brief Narrative:  Beth [name] is a 60 y.o. female with medical history significant for hypertension, alcohol abuse, COPD and tobacco abuse who presents to the emergency department via EMS due to right hip pain sustained after slipping of the 2-3 stairs at the back of her house, she was sent to [facility], ED found to have right hip fracture, Ortho consulted recommended patient be admitted to [facility]:  She was put  on o2 supplement last night, but denies being sob, no chest pain, she does has chronic cough, no new cough She is npo , awaiting for right hip surgery, she also reports right ankle pain  Assessment & Plan:   Principal Problem:   Closed right femoral fracture ([place]) Active Problems:   Alcohol abuse   Essential hypertension   COPD (chronic obstructive pulmonary disease) (HCC)   Leukocytosis   Tobacco abuse   Hypokalemia   Failure to thrive in adult   Underweight due to inadequate caloric intake   Right hip fracture She is n.p.o., will have surgery today We will follow Ortho recommendation   Hypoxia? Cxr no acute findings, she denies symptom Lung exam unremarkable, no lower extremity edema Wean O2, keep SPO2 above 88% due to history of COPD  Hypokalemia, replaced, improved  Hypertension Blood pressure low normal, hold home blood pressure medication Norvasc  Chronic alcohol abuse Patient was counseled on alcohol use cessation Continue thiamine, folic acid and multivitamin  Cigarette smoking, smoking cessation education provided, she agreed to nicotine patch 21 mg daily which is ordered   The patient's BMI is: Body mass index is 14.7 kg/m.Marland Kitchen Nutrition consult order placed  .     Unresulted Labs (From admission, onward)     Start     Ordered   Signed and Held  CBC  Daily,   R       Signed and Held   Signed and Held  Basic metabolic panel  Daily,   R      Signed and Held              DVT prophylaxis: SCDs Start: 10/17/21 0132 SCDs Start: 10/16/21 2238   Code Status: Full Family Communication: Patient Disposition:   Status is: Inpatient  Dispo: The patient is from: Home              Anticipated d/c is to: To be determined, likely will need rehab              Anticipated d/c date is: >48hrs                Consultants:  Orthopedic  Procedures:  Right hip surgery plan  Antimicrobials:    Anti-infectives (From admission, onward)    Start     Dose/Rate Route Frequency Ordered Stop   10/18/21 0600  ceFAZolin (ANCEF) IVPB 2g/100 mL premix        2 g 200 mL/hr over 30 Minutes Intravenous On call to O.R. 10/17/21 1406 10/17/21 1514   10/17/21 1422  ceFAZolin (ANCEF) 2-4 GM/100ML-% IVPB  Status:  Discontinued       Note to Pharmacy: Enrigue Catena   : cabinet override      10/17/21 1422 10/17/21 1448   10/17/21 1409  ceFAZolin (ANCEF) 2-4 GM/100ML-% IVPB  Note to Pharmacy: Dara Lords   : cabinet override      10/17/21 1409 10/18/21 0214          Objective: Vitals:   10/17/21 1020 10/17/21 1613 10/17/21 1615 10/17/21 1630  BP: 125/74 97/65 (!) 93/47 97/75  Pulse: 76  77   Resp: 16 13 18 20   Temp: 98.6 F (37 C) (!) 97.5 F (36.4 C)    TempSrc: Oral     SpO2: 98% 100% 99% 93%  Weight:      Height:        Intake/Output Summary (Last 24 hours) at 10/17/2021 1632 Last data filed at 10/17/2021 1612 Gross per 24 hour  Intake 1144.01 ml  Output 125 ml  Net 1019.01 ml   Filed Weights   10/16/21 1848  Weight: 37.6 kg    Examination:  General exam: thin, alert, awake, communicative,calm, NAD Respiratory system: Clear to auscultation. Respiratory effort normal. Cardiovascular system:  RRR.  Gastrointestinal system: Abdomen is nondistended, soft and nontender.  Normal bowel sounds heard. Central nervous system: Alert and  oriented. No focal neurological deficits. Extremities: Right leg shortened ,externally rotated Skin: No rashes, lesions or ulcers Psychiatry: Judgement and insight appear normal. Mood & affect appropriate.     Data Reviewed: I have personally reviewed following labs and imaging studies  CBC: Recent Labs  Lab 10/16/21 2030 10/17/21 0335  WBC 12.3* 10.4  NEUTROABS 8.7*  --   HGB 12.1 11.8*  HCT 36.7 36.0  MCV 97.6 97.6  PLT 368 846    Basic Metabolic Panel: Recent Labs  Lab 10/16/21 2030 10/17/21 0335  NA 135 138  K 3.3* 3.8  CL 103 105  CO2 20* 25  GLUCOSE 103* 104*  BUN 9 10  CREATININE 0.34* 0.42*  CALCIUM 9.0 8.9  MG  --  1.7  PHOS  --  3.5    GFR: Estimated Creatinine Clearance: 44.9 mL/min (A) (by C-G formula based on SCr of 0.42 mg/dL (L)).  Liver Function Tests: Recent Labs  Lab 10/16/21 2030 10/17/21 0335  AST 21 21  ALT 15 15  ALKPHOS 55 55  BILITOT 0.2* 0.8  PROT 6.6 6.4*  ALBUMIN 3.9 3.8    CBG: No results for input(s): GLUCAP in the last 168 hours.   Recent Results (from the past 240 hour(s))  Resp Panel by RT-PCR (Flu A&B, Covid) Nasopharyngeal Swab     Status: None   Collection Time: 10/16/21  9:43 PM   Specimen: Nasopharyngeal Swab; Nasopharyngeal(NP) swabs in vial transport medium  Result Value Ref Range Status   SARS Coronavirus 2 by RT PCR NEGATIVE NEGATIVE Final    Comment: (NOTE) SARS-CoV-2 target nucleic acids are NOT DETECTED.  The SARS-CoV-2 RNA is generally detectable in upper respiratory specimens during the acute phase of infection. The lowest concentration of SARS-CoV-2 viral copies this assay can detect is 138 copies/mL. A negative result does not preclude SARS-Cov-2 infection and should not be used as the sole basis for treatment or other patient management decisions. A negative result may occur with  improper specimen collection/handling, submission of specimen other than nasopharyngeal swab, presence of viral  mutation(s) within the areas targeted by this assay, and inadequate number of viral copies(<138 copies/mL). A negative result must be combined with clinical observations, patient history, and epidemiological information. The expected result is Negative.  Fact Sheet for Patients:  EntrepreneurPulse.com.au  Fact Sheet for Healthcare Providers:  IncredibleEmployment.be  This test is no t yet approved or cleared by  the [employer] and  has been authorized for detection and/or diagnosis of SARS-CoV-2 by FDA under an Emergency Use Authorization (EUA). This EUA will remain  in effect (meaning this test can be used) for the duration of the COVID-19 declaration under Section 564(b)(1) of the Act, 21 U.S.C.section 360bbb-3(b)(1), unless the authorization is terminated  or revoked sooner.       Influenza A by PCR NEGATIVE NEGATIVE Final   Influenza B by PCR NEGATIVE NEGATIVE Final    Comment: (NOTE) The Xpert Xpress SARS-CoV-2/FLU/RSV plus assay is intended as an aid in the diagnosis of influenza from Nasopharyngeal swab specimens and should not be used as a sole basis for treatment. Nasal washings and aspirates are unacceptable for Xpert Xpress SARS-CoV-2/FLU/RSV testing.  Fact Sheet for Patients: EntrepreneurPulse.com.au  Fact Sheet for Healthcare Providers: IncredibleEmployment.be  This test is not yet approved or cleared by the [place] FDA and has been authorized for detection and/or diagnosis of SARS-CoV-2 by FDA under an Emergency Use Authorization (EUA). This EUA will remain in effect (meaning this test can be used) for the duration of the COVID-19 declaration under Section 564(b)(1) of the Act, 21 U.S.C. section 360bbb-3(b)(1), unless the authorization is terminated or revoked.  Performed at [facility], [address], [place], [place] [address]   Surgical pcr screen     Status: Abnormal    Collection Time: 10/17/21  6:07 AM   Specimen: Nasal Mucosa; Nasal Swab  Result Value Ref Range Status   MRSA, PCR NEGATIVE NEGATIVE Final   Staphylococcus aureus POSITIVE (A) NEGATIVE Final    Comment: CRITICAL RESULT CALLED TO, READ BACK BY AND VERIFIED WITH: JESSIE, RN @ [id number] ON 10/17/2021 BY LBROOKS, MLT (NOTE) The Xpert SA Assay (FDA approved for NASAL specimens in patients 41 years of age and older), is one component of a comprehensive surveillance program. It is not intended to diagnose infection nor to guide or monitor treatment. Performed at [facility], [place] [address], [place], [place] [address]          Radiology Studies: Chest Portable 1 View  Result Date: 10/16/2021 CLINICAL DATA:  Right hip fracture.  Medical clearance.  COPD. EXAM: PORTABLE CHEST 1 VIEW COMPARISON:  02/13/2021 FINDINGS: The lungs are hyperinflated in keeping with changes of underlying COPD. Parenchymal scarring within the right upper lobe appears stable since prior examination dating as far back as 10/02/2011 and is better assessed on prior CT examination of 10/03/2011. No new focal pulmonary nodule or infiltrate. No pneumothorax or pleural effusion. Cardiac size within normal limits. Remote nonunited distal right clavicle fracture noted. Multiple healed right rib fractures noted. Bilateral IMPRESSION: No active disease.  COPD. Electronically Signed   By: Fidela Salisbury M.D.   On: 10/16/2021 23:07   DG Ankle Right Port  Result Date: 10/17/2021 CLINICAL DATA:  Fall EXAM: PORTABLE RIGHT ANKLE - 2 VIEW COMPARISON:  02/14/2021 FINDINGS: There is no evidence of acute fracture, dislocation, or joint effusion. Status post plate and screw fixation of the distal right fibula. There is no evidence of arthropathy or other focal bone abnormality. Soft tissues are unremarkable. IMPRESSION: 1. No acute fracture or dislocation of the right ankle. 2. Status post plate and screw fixation of the distal  right fibula. Electronically Signed   By: Delanna Ahmadi M.D.   On: 10/17/2021 16:04   DG C-Arm 1-60 Min-No Report  Result Date: 10/17/2021 Fluoroscopy was utilized by the requesting physician.  No radiographic interpretation.   DG  HIP OPERATIVE UNILAT W OR W/O PELVIS RIGHT  Result Date: 10/17/2021 CLINICAL DATA:  Right femoral intramedullary nail placement. EXAM: OPERATIVE RIGHT HIP (WITH PELVIS IF PERFORMED) 3 VIEWS TECHNIQUE: Fluoroscopic spot image(s) were submitted for interpretation post-operatively. COMPARISON:  10/16/2021 FINDINGS: Three spot fluoroscopic images over the right hip demonstrate placement of right femoral intramedullary nail with associated screw bridging the femoral neck into the femoral head. Hardware bridges patient's known intertrochanteric fracture as the hardware is intact with anatomic alignment over the fracture site. IMPRESSION: Fixation of right intertrochanteric fracture with hardware intact and anatomic alignment over the fracture site. Electronically Signed   By: Marin Olp M.D.   On: 10/17/2021 16:23   DG HIP UNILAT WITH PELVIS 2-3 VIEWS RIGHT  Result Date: 10/16/2021 CLINICAL DATA:  Recent fall with right-sided hip pain, initial encounter EXAM: DG HIP (WITH OR WITHOUT PELVIS) 2-3V RIGHT COMPARISON:  None. FINDINGS: Intratrochanteric comminuted fracture is noted in the proximal right femur. Some impaction and angulation at the fracture site is noted. Femoral head is well seated. Pelvic ring is otherwise intact. IMPRESSION: Right intratrochanteric fracture. Electronically Signed   By: Inez Catalina M.D.   On: 10/16/2021 20:33        Scheduled Meds:  chlorhexidine  60 mL Topical Once   [MAR Hold] feeding supplement  237 mL Oral BID BM   [MAR Hold] folic acid  1 mg Oral Daily   [MAR Hold] multivitamin with minerals  1 tablet Oral Daily   [MAR Hold] nicotine  21 mg Transdermal Daily   povidone-iodine  2 application Topical Once   povidone-iodine  2  application Topical Once   [MAR Hold] thiamine  100 mg Oral Daily   Continuous Infusions:  [MAR Hold] sodium chloride 10 mL/hr at 10/17/21 0133   acetaminophen     ceFAZolin     tranexamic acid       LOS: 1 day   Time spent: 24mins Greater than 50% of this time was spent in counseling, explanation of diagnosis, planning of further management, and coordination of care.   Voice Recognition Viviann Spare dictation system was used to create this note, attempts have been made to correct errors. Please contact the author with questions and/or clarifications.   Florencia Reasons, MD PhD FACP [facility]  Available via Epic secure chat 7am-7pm for nonurgent issues Please page for urgent issues To page the attending provider between 7A-7P or the covering provider during after hours 7P-7A, please log into the web site [email or web address] and access using universal [place] password for that web site. If you do not have the password, please call the hospital operator.    10/17/2021, 4:32 PM

## 2021-10-17 NOTE — Consult Note (Signed)
Reason for Consult: right hip fracture Referring Physician: Florencia Reasons, MD (Hospitalist)  Beth [name] is an 60 y.o. female.  HPI: Beth [name] is a 60 y.o. female with medical history significant for hypertension, alcohol abuse, COPD and tobacco abuse who presents to the emergency department via EMS due to right hip pain sustained after slipping of the 2-3 stairs at the back of her house, husband came to her rescue and took her to bed.  She tells me this am that she tripped over a throw rug in the house?? She continued to complain of right hip pain and inability to bear weight on right leg, EMS was activated and patient was taken to the ED for further evaluation and management.  She denies chest pain, shortness of breath, nausea, vomiting or abdominal pain. She denies any other musculoskeletal related issues    Past Medical History:  Diagnosis Date   Alcohol abuse 10/26/2011   Brain bleed ([place]) 2012   Cervical cancer (HCC)    COPD (chronic obstructive pulmonary disease) (HCC)    Displaced fracture of lateral malleolus of right fibula, initial encounter for closed fracture 02/13/2021   Hypertension    Lung infection    Tobacco abuse     Past Surgical History:  Procedure Laterality Date   BIOPSY  02/04/2020   Procedure: BIOPSY;  Surgeon: Danie Binder, MD;  [facility];  Service: Endoscopy;;   BRONCHOSCOPY     COLONOSCOPY WITH PROPOFOL N/A 02/04/2020   Procedure: COLONOSCOPY WITH PROPOFOL;  Surgeon: Danie Binder, MD;  [facility];  Service: Endoscopy;  Laterality: N/A;  8:45am   KNEE ARTHROSCOPY WITH MENISCAL REPAIR Right 08/25/2020   Procedure: KNEE ARTHROSCOPY WITH  MEDIAL MENISCAL REPAIR;  Surgeon: Carole Civil, MD;  Location: [facility];  Service: Orthopedics;  Laterality: Right;   MULTIPLE TOOTH EXTRACTIONS     ORIF ANKLE FRACTURE Right 02/14/2021   Procedure: OPEN REDUCTION INTERNAL FIXATION (ORIF) ANKLE FRACTURE;  Surgeon: [name], MD;  Location:  [facility];  Service: Orthopedics;  Laterality: Right;   ORIF FEMUR FRACTURE Right 02/14/2021   Procedure: OPEN REDUCTION INTERNAL FIXATION (ORIF) DISTAL FEMUR FRACTURE;  Surgeon: [name], MD;  Location: [place];  Service: Orthopedics;  Laterality: Right;   POLYPECTOMY  02/04/2020   Procedure: POLYPECTOMY;  Surgeon: Danie Binder, MD;  [facility];  Service: Endoscopy;;   rod in arm Left     Family History  Problem Relation Age of Onset   Throat cancer Mother    Lung cancer Father    Throat cancer Brother    Lung cancer Brother    Colon cancer Neg Hx     Social History:  reports that she has been smoking cigarettes. She has a 8.75 pack-year smoking history. She has never used smokeless tobacco. She reports that she does not currently use alcohol. She reports that she does not use drugs.  Allergies:  Allergies  Allergen Reactions   Amoxicillin Nausea Only   Codeine Itching    Medications: I have reviewed the patient's current medications. Scheduled:  feeding supplement  237 mL Oral BID BM   folic acid  1 mg Oral Daily   multivitamin with minerals  1 tablet Oral Daily   nicotine  14 mg Transdermal Daily   thiamine  100 mg Oral Daily    Results for orders placed or performed during the hospital encounter of 10/16/21 (from the past 24 hour(s))  CBC  with Differential/Platelet     Status: Abnormal   Collection Time: 10/16/21  8:30 PM  Result Value Ref Range   WBC 12.3 (H) 4.0 - 10.5 K/uL   RBC 3.76 (L) 3.87 - 5.11 MIL/uL   Hemoglobin 12.1 12.0 - 15.0 g/dL   HCT 36.7 36.0 - 46.0 %   MCV 97.6 80.0 - 100.0 fL   MCH 32.2 26.0 - 34.0 pg   MCHC 33.0 30.0 - 36.0 g/dL   RDW 13.0 11.5 - 15.5 %   Platelets 368 150 - 400 K/uL   nRBC 0.0 0.0 - 0.2 %   Neutrophils Relative % 70 %   Neutro Abs 8.7 (H) 1.7 - 7.7 K/uL   Lymphocytes Relative 19 %   Lymphs Abs 2.3 0.7 - 4.0 K/uL   Monocytes Relative 8 %   Monocytes Absolute 0.9 0.1 - 1.0 K/uL   Eosinophils Relative 2 %    Eosinophils Absolute 0.2 0.0 - 0.5 K/uL   Basophils Relative 1 %   Basophils Absolute 0.1 0.0 - 0.1 K/uL   Immature Granulocytes 0 %   Abs Immature Granulocytes 0.05 0.00 - 0.07 K/uL  Comprehensive metabolic panel     Status: Abnormal   Collection Time: 10/16/21  8:30 PM  Result Value Ref Range   Sodium 135 135 - 145 mmol/L   Potassium 3.3 (L) 3.5 - 5.1 mmol/L   Chloride 103 98 - 111 mmol/L   CO2 20 (L) 22 - 32 mmol/L   Glucose, Bld 103 (H) 70 - 99 mg/dL   BUN 9 6 - 20 mg/dL   Creatinine, Ser 0.34 (L) 0.44 - 1.00 mg/dL   Calcium 9.0 8.9 - 10.3 mg/dL   Total Protein 6.6 6.5 - 8.1 g/dL   Albumin 3.9 3.5 - 5.0 g/dL   AST 21 15 - 41 U/L   ALT 15 0 - 44 U/L   Alkaline Phosphatase 55 38 - 126 U/L   Total Bilirubin 0.2 (L) 0.3 - 1.2 mg/dL   GFR, Estimated >60 >60 mL/min   Anion gap 12 5 - 15  Type and screen     Status: None   Collection Time: 10/16/21  9:19 PM  Result Value Ref Range   ABO/RH(D) O POS    Antibody Screen NEG    Sample Expiration      10/19/2021,2359 Performed at [facility], [address], [place], [place] [address]   Resp Panel by RT-PCR (Flu A&B, Covid) Nasopharyngeal Swab     Status: None   Collection Time: 10/16/21  9:43 PM   Specimen: Nasopharyngeal Swab; Nasopharyngeal(NP) swabs in vial transport medium  Result Value Ref Range   SARS Coronavirus 2 by RT PCR NEGATIVE NEGATIVE   Influenza A by PCR NEGATIVE NEGATIVE   Influenza B by PCR NEGATIVE NEGATIVE  Comprehensive metabolic panel     Status: Abnormal   Collection Time: 10/17/21  3:35 AM  Result Value Ref Range   Sodium 138 135 - 145 mmol/L   Potassium 3.8 3.5 - 5.1 mmol/L   Chloride 105 98 - 111 mmol/L   CO2 25 22 - 32 mmol/L   Glucose, Bld 104 (H) 70 - 99 mg/dL   BUN 10 6 - 20 mg/dL   Creatinine, Ser 0.42 (L) 0.44 - 1.00 mg/dL   Calcium 8.9 8.9 - 10.3 mg/dL   Total Protein 6.4 (L) 6.5 - 8.1 g/dL   Albumin 3.8 3.5 - 5.0 g/dL   AST 21 15 - 41 U/L   ALT  15 0 - 44 U/L   Alkaline Phosphatase 55 38  - 126 U/L   Total Bilirubin 0.8 0.3 - 1.2 mg/dL   GFR, Estimated >60 >60 mL/min   Anion gap 8 5 - 15  CBC     Status: Abnormal   Collection Time: 10/17/21  3:35 AM  Result Value Ref Range   WBC 10.4 4.0 - 10.5 K/uL   RBC 3.69 (L) 3.87 - 5.11 MIL/uL   Hemoglobin 11.8 (L) 12.0 - 15.0 g/dL   HCT 36.0 36.0 - 46.0 %   MCV 97.6 80.0 - 100.0 fL   MCH 32.0 26.0 - 34.0 pg   MCHC 32.8 30.0 - 36.0 g/dL   RDW 13.1 11.5 - 15.5 %   Platelets 336 150 - 400 K/uL   nRBC 0.0 0.0 - 0.2 %  Magnesium     Status: None   Collection Time: 10/17/21  3:35 AM  Result Value Ref Range   Magnesium 1.7 1.7 - 2.4 mg/dL  Phosphorus     Status: None   Collection Time: 10/17/21  3:35 AM  Result Value Ref Range   Phosphorus 3.5 2.5 - 4.6 mg/dL    X-ray: CLINICAL DATA:  Recent fall with right-sided hip pain, initial encounter   EXAM: DG HIP (WITH OR WITHOUT PELVIS) 2-3V RIGHT   COMPARISON:  None.   FINDINGS: Intratrochanteric comminuted fracture is noted in the proximal right femur. Some impaction and angulation at the fracture site is noted. Femoral head is well seated. Pelvic ring is otherwise intact.   IMPRESSION: Right intratrochanteric fracture.     Electronically Signed   By: Inez Catalina M.D.  ROS: As noted in HPI  Blood pressure 120/69, pulse 79, temperature 98.8 F (37.1 C), resp. rate 16, height 5\' 3"  (1.6 m), weight 37.6 kg, last menstrual period 10/08/2019, SpO2 99 %.  Physical Exam: General: 60 y.o. year-old cachectic female  in no acute distress.  Alert and oriented x3. HEENT: NCAT, EOMI Neck: Supple, trachea medial Cardiovascular: Regular rate and rhythm with no rubs or gallops.  No thyromegaly or JVD noted.  No lower extremity edema. 2/4 pulses in all 4 extremities. Respiratory: Clear to auscultation with no wheezes or rales. Good inspiratory effort. Abdomen: Soft, nontender nondistended with normal bowel sounds x4 quadrants. Muskuloskeletal: Tender to palpation of right hip.   Restricted ROM of RLE due to pain.  Shortening and ER of right lower extremity Neuro: CN II-XII intact, sensation, reflexes intact Skin: No ulcerative lesions noted or rashes Psychiatry: Judgement and insight appear normal. Mood is appropriate for condition and setting    Assessment/Plan: Right intertrochanteric hip fracture  Plan: I reviewed with the injury to the right hip and the need for surgical management I will try to get this addressed today NPO Consent and orders placed Reviewed risks of infection, malunion, non union and need for further surgeries Reviewed post op course and expectations with hope of home discharge after a couple days of therapy  Mauri Pole 10/17/2021, 6:56 AM

## 2021-10-18 ENCOUNTER — Encounter (HOSPITAL_COMMUNITY): Payer: Self-pay | Admitting: Orthopedic Surgery

## 2021-10-18 DIAGNOSIS — E43 Unspecified severe protein-calorie malnutrition: Secondary | ICD-10-CM | POA: Insufficient documentation

## 2021-10-18 DIAGNOSIS — J449 Chronic obstructive pulmonary disease, unspecified: Secondary | ICD-10-CM | POA: Diagnosis not present

## 2021-10-18 DIAGNOSIS — R627 Adult failure to thrive: Secondary | ICD-10-CM | POA: Diagnosis not present

## 2021-10-18 DIAGNOSIS — S72001A Fracture of unspecified part of neck of right femur, initial encounter for closed fracture: Secondary | ICD-10-CM | POA: Diagnosis not present

## 2021-10-18 LAB — BASIC METABOLIC PANEL
Anion gap: 6 (ref 5–15)
BUN: 8 mg/dL (ref 6–20)
CO2: 25 mmol/L (ref 22–32)
Calcium: 8.3 mg/dL — ABNORMAL LOW (ref 8.9–10.3)
Chloride: 102 mmol/L (ref 98–111)
Creatinine, Ser: 0.33 mg/dL — ABNORMAL LOW (ref 0.44–1.00)
GFR, Estimated: >60 mL/min (ref >60)
Glucose, Bld: 163 mg/dL — ABNORMAL HIGH (ref 70–99)
Potassium: 3.3 mmol/L — ABNORMAL LOW (ref 3.5–5.1)
Sodium: 133 mmol/L — ABNORMAL LOW (ref 135–145)

## 2021-10-18 LAB — MAGNESIUM: Magnesium: 1.5 mg/dL — ABNORMAL LOW (ref 1.7–2.4)

## 2021-10-18 LAB — CBC
HCT: 34 % — ABNORMAL LOW (ref 36.0–46.0)
Hemoglobin: 11.3 g/dL — ABNORMAL LOW (ref 12.0–15.0)
MCH: 32 pg (ref 26.0–34.0)
MCHC: 33.2 g/dL (ref 30.0–36.0)
MCV: 96.3 fL (ref 80.0–100.0)
Platelets: 319 10*3/uL (ref 150–400)
RBC: 3.53 MIL/uL — ABNORMAL LOW (ref 3.87–5.11)
RDW: 12.9 % (ref 11.5–15.5)
WBC: 12.3 10*3/uL — ABNORMAL HIGH (ref 4.0–10.5)
nRBC: 0 % (ref 0.0–0.2)

## 2021-10-18 MED ORDER — METOPROLOL TARTRATE 5 MG/5ML IV SOLN: 5.0000 mg | INTRAVENOUS | Status: DC | PRN

## 2021-10-18 MED ORDER — POTASSIUM CHLORIDE 20 MEQ PO PACK
40.0000 meq | PACK | Freq: Once | ORAL | Status: AC
  Administered 2021-10-18: 40 meq via ORAL
  Filled 2021-10-18: qty 2

## 2021-10-18 MED ORDER — HYDRALAZINE HCL 20 MG/ML IJ SOLN: 10.0000 mg | INTRAMUSCULAR | Status: DC | PRN

## 2021-10-18 MED ORDER — IPRATROPIUM-ALBUTEROL 0.5-2.5 (3) MG/3ML IN SOLN: 3.0000 mL | RESPIRATORY_TRACT | Status: DC | PRN

## 2021-10-18 MED ORDER — GUAIFENESIN 100 MG/5ML PO LIQD: 5.0000 mL | ORAL | Status: DC | PRN

## 2021-10-18 MED ORDER — SENNOSIDES-DOCUSATE SODIUM 8.6-50 MG PO TABS: 1.0000 | ORAL_TABLET | Freq: Every evening | ORAL | Status: DC | PRN

## 2021-10-18 MED ORDER — ENSURE ENLIVE PO LIQD
237.0000 mL | Freq: Three times a day (TID) | ORAL | Status: DC
Start: 2021-10-18 — End: 2021-10-18
  Administered 2021-10-18: 237 mL via ORAL

## 2021-10-18 MED ORDER — ASPIRIN 81 MG PO TBEC: 81.0000 mg | DELAYED_RELEASE_TABLET | Freq: Two times a day (BID) | ORAL | 0 refills | Status: AC

## 2021-10-18 MED ORDER — DOCUSATE SODIUM 100 MG PO CAPS: 100.0000 mg | ORAL_CAPSULE | Freq: Two times a day (BID) | ORAL | 0 refills | Status: AC

## 2021-10-18 NOTE — Discharge Summary (Signed)
Physician Discharge Summary  Beth [name] UMP:[id number] DOB: October 19, 1961 DOA: 10/16/2021  PCP: Sandi Mariscal, MD  Admit date: 10/16/2021 Discharge date: 10/18/2021  Admitted From: Home Disposition: Home with home health  Recommendations for Outpatient Follow-up:  Follow up with PCP in 1-2 weeks Please obtain BMP/CBC in one week your next doctors visit.  Aspirin 81 mg twice daily for DVT prophylaxis, 4 weeks Outpatient follow-up with orthopedic in 2 weeks Patient already has outpatient prescription for narcotics which she can continue using.  Bowel regimen has been prescribed Up with therapy, partial weightbearing of right lower extremity  Discharge Condition: Stable CODE STATUS: Full code Diet recommendation: 2 g salt  Brief/Interim Summary: 60 y.o. female with medical history significant for hypertension, alcohol abuse, COPD and tobacco abuse who presents to the emergency department via EMS due to right hip pain sustained after slipping of the 2-3 stairs at the back of her house, she was sent to [facility], ED found to have right hip fracture, Ortho consulted recommended patient be admitted to [facility].  Patient went for IM nailing on 11/20 by orthopedic.  PT recommended home health therefore arrangements were made.  Rest of the recommendations as stated above.  Resume home blood pressure medications upon discharge     Assessment & Plan:   Principal Problem:   Closed right femoral fracture (HCC) Active Problems:   Alcohol abuse   Essential hypertension   COPD (chronic obstructive pulmonary disease) (HCC)   Leukocytosis   Tobacco abuse   Hypokalemia   Failure to thrive in adult   Underweight due to inadequate caloric intake       Right hip fracture status postrepair -Status post repair with intramedullary nail of the right hip on 11/20.  Orthopedic recommending aspirin as DVT prophylaxis for 4 weeks, partial weightbearing of right lower extremity.  Outpatient follow-up in 2  weeks.    Essential hypertension Blood pressure low normal, hold home blood pressure medication Norvasc   Hypokalemia/hypomagnesemia - Repletion as needed   Chronic alcohol use at home Patient was counseled on alcohol use cessation Continue thiamine, folic acid and multivitamin   Daily tobacco use, counseled to quit using - Nicotine patch as needed   Moderate to severe protein calorie malnutrition The patient's BMI is: Body mass index is 14.7 kg/m.. -Nutrition consult  Body mass index is 14.7 kg/m.         Discharge Diagnoses:  Principal Problem:   Closed right femoral fracture (HCC) Active Problems:   Alcohol abuse   Essential hypertension   COPD (chronic obstructive pulmonary disease) (HCC)   Leukocytosis   Tobacco abuse   Hypokalemia   Failure to thrive in adult   Underweight due to inadequate caloric intake   Protein-calorie malnutrition, severe      Consultations: Orthopedics  Subjective: Initially patient hesitant to take aspirin due to remote history of ICH but after my discussion she is agreeable to take this.  No other complaints.  Discharge Exam: Vitals:   10/18/21 1026 10/18/21 1329  BP: 121/72 119/72  Pulse: 77 81  Resp: 18 18  Temp: 98.7 F (37.1 C) 98 F (36.7 C)  SpO2: 100% 100%   Vitals:   10/18/21 0000 10/18/21 0601 10/18/21 1026 10/18/21 1329  BP: 125/71 126/77 121/72 119/72  Pulse: 76 80 77 81  Resp: 18 14 18 18   Temp: 99.2 F (37.3 C) 98.4 F (36.9 C) 98.7 F (37.1 C) 98 F (36.7 C)  TempSrc: Oral Oral Oral Oral  SpO2: 99% 99% 100% 100%  Weight:      Height:        General: Pt is alert, awake, not in acute distress Cardiovascular: RRR, S1/S2 +, no rubs, no gallops Respiratory: CTA bilaterally, no wheezing, no rhonchi Abdominal: Soft, NT, ND, bowel sounds + Extremities: no edema, no cyanosis, surgical site of the left hip looks okay  Discharge Instructions   Allergies as of 10/18/2021       Reactions    Amoxicillin Nausea Only   Codeine Itching        Medication List     STOP taking these medications    folic acid 1 MG tablet Commonly known as: FOLVITE   magnesium oxide 400 (241.3 Mg) MG tablet Commonly known as: MAG-OX   polyethylene glycol 17 g packet Commonly known as: MIRALAX / GLYCOLAX       TAKE these medications    acetaminophen 500 MG tablet Commonly known as: TYLENOL Take 1,000 mg by mouth daily as needed for headache.   albuterol 108 (90 Base) MCG/ACT inhaler Commonly known as: VENTOLIN HFA Inhale 1-2 puffs into the lungs every 6 (six) hours as needed for wheezing or shortness of breath.   albuterol (2.5 MG/3ML) 0.083% nebulizer solution Commonly known as: PROVENTIL Take 2.5 mg by nebulization every 6 (six) hours as needed for wheezing or shortness of breath.   amLODipine 5 MG tablet Commonly known as: NORVASC Take 5 mg by mouth every morning.   aspirin 81 MG EC tablet Take 1 tablet (81 mg total) by mouth 2 (two) times daily. Swallow whole.   docusate sodium 100 MG capsule Commonly known as: COLACE Take 1 capsule (100 mg total) by mouth 2 (two) times daily.   Magnesium 250 MG Tabs Take 250 mg by mouth every morning.   multivitamin with minerals Tabs tablet Take 1 tablet by mouth every morning.   nicotine 14 mg/24hr patch Commonly known as: NICODERM CQ - dosed in mg/24 hours Place 1 patch (14 mg total) onto the skin daily.   oxyCODONE-acetaminophen 5-325 MG tablet Commonly known as: PERCOCET/ROXICET Take 1 tablet by mouth 4 (four) times daily as needed (pain).   thiamine 100 MG tablet Take 1 tablet (100 mg total) by mouth daily.   Trelegy Ellipta 200-62.5-25 MCG/ACT Aepb Generic drug: Fluticasone-Umeclidin-Vilant Inhale 1 puff into the lungs every morning.   Vitamin D3 250 MCG (10000 UT) Tabs Take 10,000 Units by mouth every morning.        Follow-up Information     Beth Cancel, MD. Schedule an appointment as soon as possible  for a visit in 2 week(s).   Specialty: Orthopedic Surgery Why: wound check and X-rays of right hip Contact information: [address] [place] [address] [phone]         Sandi Mariscal, MD Follow up in 1 week(s).   Specialty: Internal Medicine Contact information: [place] [address] [phone]                Allergies  Allergen Reactions   Amoxicillin Nausea Only   Codeine Itching    You were cared for by a hospitalist during your hospital stay. If you have any questions about your discharge medications or the care you received while you were in the hospital after you are discharged, you can call the unit and asked to speak with the hospitalist on call if the hospitalist that took care of you is not available. Once you are discharged, your  primary care physician will handle any further medical issues. Please note that no refills for any discharge medications will be authorized once you are discharged, as it is imperative that you return to your primary care physician (or establish a relationship with a primary care physician if you do not have one) for your aftercare needs so that they can reassess your need for medications and monitor your lab values.   Procedures/Studies: Chest Portable 1 View  Result Date: 10/16/2021 CLINICAL DATA:  Right hip fracture.  Medical clearance.  COPD. EXAM: PORTABLE CHEST 1 VIEW COMPARISON:  02/13/2021 FINDINGS: The lungs are hyperinflated in keeping with changes of underlying COPD. Parenchymal scarring within the right upper lobe appears stable since prior examination dating as far back as 10/02/2011 and is better assessed on prior CT examination of 10/03/2011. No new focal pulmonary nodule or infiltrate. No pneumothorax or pleural effusion. Cardiac size within normal limits. Remote nonunited distal right clavicle fracture noted. Multiple healed right rib fractures noted. Bilateral IMPRESSION: No active  disease.  COPD. Electronically Signed   By: Fidela Salisbury M.D.   On: 10/16/2021 23:07   DG Ankle Right Port  Result Date: 10/17/2021 CLINICAL DATA:  Fall EXAM: PORTABLE RIGHT ANKLE - 2 VIEW COMPARISON:  02/14/2021 FINDINGS: There is no evidence of acute fracture, dislocation, or joint effusion. Status post plate and screw fixation of the distal right fibula. There is no evidence of arthropathy or other focal bone abnormality. Soft tissues are unremarkable. IMPRESSION: 1. No acute fracture or dislocation of the right ankle. 2. Status post plate and screw fixation of the distal right fibula. Electronically Signed   By: Delanna Ahmadi M.D.   On: 10/17/2021 16:04   DG C-Arm 1-60 Min-No Report  Result Date: 10/17/2021 Fluoroscopy was utilized by the requesting physician.  No radiographic interpretation.   DG HIP OPERATIVE UNILAT W OR W/O PELVIS RIGHT  Result Date: 10/17/2021 CLINICAL DATA:  Right femoral intramedullary nail placement. EXAM: OPERATIVE RIGHT HIP (WITH PELVIS IF PERFORMED) 3 VIEWS TECHNIQUE: Fluoroscopic spot image(s) were submitted for interpretation post-operatively. COMPARISON:  10/16/2021 FINDINGS: Three spot fluoroscopic images over the right hip demonstrate placement of right femoral intramedullary nail with associated screw bridging the femoral neck into the femoral head. Hardware bridges patient's known intertrochanteric fracture as the hardware is intact with anatomic alignment over the fracture site. IMPRESSION: Fixation of right intertrochanteric fracture with hardware intact and anatomic alignment over the fracture site. Electronically Signed   By: Marin Olp M.D.   On: 10/17/2021 16:23   DG HIP UNILAT WITH PELVIS 2-3 VIEWS RIGHT  Result Date: 10/16/2021 CLINICAL DATA:  Recent fall with right-sided hip pain, initial encounter EXAM: DG HIP (WITH OR WITHOUT PELVIS) 2-3V RIGHT COMPARISON:  None. FINDINGS: Intratrochanteric comminuted fracture is noted in the proximal right  femur. Some impaction and angulation at the fracture site is noted. Femoral head is well seated. Pelvic ring is otherwise intact. IMPRESSION: Right intratrochanteric fracture. Electronically Signed   By: Inez Catalina M.D.   On: 10/16/2021 20:33     The results of significant diagnostics from this hospitalization (including imaging, microbiology, ancillary and laboratory) are listed below for reference.     Microbiology: Recent Results (from the past 240 hour(s))  Resp Panel by RT-PCR (Flu A&B, Covid) Nasopharyngeal Swab     Status: None   Collection Time: 10/16/21  9:43 PM   Specimen: Nasopharyngeal Swab; Nasopharyngeal(NP) swabs in vial transport medium  Result Value Ref Range Status   SARS Coronavirus  2 by RT PCR NEGATIVE NEGATIVE Final    Comment: (NOTE) SARS-CoV-2 target nucleic acids are NOT DETECTED.  The SARS-CoV-2 RNA is generally detectable in upper respiratory specimens during the acute phase of infection. The lowest concentration of SARS-CoV-2 viral copies this assay can detect is 138 copies/mL. A negative result does not preclude SARS-Cov-2 infection and should not be used as the sole basis for treatment or other patient management decisions. A negative result may occur with  improper specimen collection/handling, submission of specimen other than nasopharyngeal swab, presence of viral mutation(s) within the areas targeted by this assay, and inadequate number of viral copies(<138 copies/mL). A negative result must be combined with clinical observations, patient history, and epidemiological information. The expected result is Negative.  Fact Sheet for Patients:  EntrepreneurPulse.com.au  Fact Sheet for Healthcare Providers:  IncredibleEmployment.be  This test is no t yet approved or cleared by the [place] FDA and  has been authorized for detection and/or diagnosis of SARS-CoV-2 by FDA under an Emergency Use Authorization (EUA).  This EUA will remain  in effect (meaning this test can be used) for the duration of the COVID-19 declaration under Section 564(b)(1) of the Act, 21 U.S.C.section 360bbb-3(b)(1), unless the authorization is terminated  or revoked sooner.       Influenza A by PCR NEGATIVE NEGATIVE Final   Influenza B by PCR NEGATIVE NEGATIVE Final    Comment: (NOTE) The Xpert Xpress SARS-CoV-2/FLU/RSV plus assay is intended as an aid in the diagnosis of influenza from Nasopharyngeal swab specimens and should not be used as a sole basis for treatment. Nasal washings and aspirates are unacceptable for Xpert Xpress SARS-CoV-2/FLU/RSV testing.  Fact Sheet for Patients: EntrepreneurPulse.com.au  Fact Sheet for Healthcare Providers: IncredibleEmployment.be  This test is not yet approved or cleared by the [place] FDA and has been authorized for detection and/or diagnosis of SARS-CoV-2 by FDA under an Emergency Use Authorization (EUA). This EUA will remain in effect (meaning this test can be used) for the duration of the COVID-19 declaration under Section 564(b)(1) of the Act, 21 U.S.C. section 360bbb-3(b)(1), unless the authorization is terminated or revoked.  Performed at [facility], [address] [place], [place] [address]   Surgical pcr screen     Status: Abnormal   Collection Time: 10/17/21  6:07 AM   Specimen: Nasal Mucosa; Nasal Swab  Result Value Ref Range Status   MRSA, PCR NEGATIVE NEGATIVE Final   Staphylococcus aureus POSITIVE (A) NEGATIVE Final    Comment: CRITICAL RESULT CALLED TO, READ BACK BY AND VERIFIED WITH: JESSIE, RN @ [phone] ON 10/17/2021 BY LBROOKS, MLT (NOTE) The Xpert SA Assay (FDA approved for NASAL specimens in patients 52 years of age and older), is one component of a comprehensive surveillance program. It is not intended to diagnose infection nor to guide or monitor treatment. Performed at [facility], [place] [address], [place], [place] [address]      Labs: BNP (last 3 results) No results for input(s): BNP in the last 8760 hours. Basic Metabolic Panel: Recent Labs  Lab 10/16/21 2030 10/17/21 0335 10/18/21 0348  NA 135 138 133*  K 3.3* 3.8 3.3*  CL 103 105 102  CO2 20* 25 25  GLUCOSE 103* 104* 163*  BUN 9 10 8   CREATININE 0.34* 0.42* 0.33*  CALCIUM 9.0 8.9 8.3*  MG  --  1.7 1.5*  PHOS  --  3.5  --    Liver Function Tests: Recent Labs  Lab 10/16/21  2030 10/17/21 0335  AST 21 21  ALT 15 15  ALKPHOS 55 55  BILITOT 0.2* 0.8  PROT 6.6 6.4*  ALBUMIN 3.9 3.8   No results for input(s): LIPASE, AMYLASE in the last 168 hours. No results for input(s): AMMONIA in the last 168 hours. CBC: Recent Labs  Lab 10/16/21 2030 10/17/21 0335 10/18/21 0348  WBC 12.3* 10.4 12.3*  NEUTROABS 8.7*  --   --   HGB 12.1 11.8* 11.3*  HCT 36.7 36.0 34.0*  MCV 97.6 97.6 96.3  PLT 368 336 319   Cardiac Enzymes: No results for input(s): CKTOTAL, CKMB, CKMBINDEX, TROPONINI in the last 168 hours. BNP: Invalid input(s): POCBNP CBG: No results for input(s): GLUCAP in the last 168 hours. D-Dimer No results for input(s): DDIMER in the last 72 hours. Hgb A1c No results for input(s): HGBA1C in the last 72 hours. Lipid Profile No results for input(s): CHOL, HDL, LDLCALC, TRIG, CHOLHDL, LDLDIRECT in the last 72 hours. Thyroid function studies No results for input(s): TSH, T4TOTAL, T3FREE, THYROIDAB in the last 72 hours.  Invalid input(s): FREET3 Anemia work up No results for input(s): VITAMINB12, FOLATE, FERRITIN, TIBC, IRON, RETICCTPCT in the last 72 hours. Urinalysis    Component Value Date/Time   COLORURINE STRAW (A) 02/13/2021 1654   APPEARANCEUR HAZY (A) 02/13/2021 1654   LABSPEC 1.005 02/13/2021 1654   PHURINE 7.0 02/13/2021 1654   GLUCOSEU NEGATIVE 02/13/2021 1654   HGBUR NEGATIVE 02/13/2021 1654   BILIRUBINUR NEGATIVE 02/13/2021 1654   KETONESUR NEGATIVE 02/13/2021 1654    PROTEINUR NEGATIVE 02/13/2021 1654   UROBILINOGEN 0.2 10/02/2011 2305   NITRITE NEGATIVE 02/13/2021 1654   LEUKOCYTESUR NEGATIVE 02/13/2021 1654   Sepsis Labs Invalid input(s): PROCALCITONIN,  WBC,  LACTICIDVEN Microbiology Recent Results (from the past 240 hour(s))  Resp Panel by RT-PCR (Flu A&B, Covid) Nasopharyngeal Swab     Status: None   Collection Time: 10/16/21  9:43 PM   Specimen: Nasopharyngeal Swab; Nasopharyngeal(NP) swabs in vial transport medium  Result Value Ref Range Status   SARS Coronavirus 2 by RT PCR NEGATIVE NEGATIVE Final    Comment: (NOTE) SARS-CoV-2 target nucleic acids are NOT DETECTED.  The SARS-CoV-2 RNA is generally detectable in upper respiratory specimens during the acute phase of infection. The lowest concentration of SARS-CoV-2 viral copies this assay can detect is 138 copies/mL. A negative result does not preclude SARS-Cov-2 infection and should not be used as the sole basis for treatment or other patient management decisions. A negative result may occur with  improper specimen collection/handling, submission of specimen other than nasopharyngeal swab, presence of viral mutation(s) within the areas targeted by this assay, and inadequate number of viral copies(<138 copies/mL). A negative result must be combined with clinical observations, patient history, and epidemiological information. The expected result is Negative.  Fact Sheet for Patients:  EntrepreneurPulse.com.au  Fact Sheet for Healthcare Providers:  IncredibleEmployment.be  This test is no t yet approved or cleared by the [place] FDA and  has been authorized for detection and/or diagnosis of SARS-CoV-2 by FDA under an Emergency Use Authorization (EUA). This EUA will remain  in effect (meaning this test can be used) for the duration of the COVID-19 declaration under Section 564(b)(1) of the Act, 21 U.S.C.section 360bbb-3(b)(1), unless the  authorization is terminated  or revoked sooner.       Influenza A by PCR NEGATIVE NEGATIVE Final   Influenza B by PCR NEGATIVE NEGATIVE Final    Comment: (NOTE) The Xpert Xpress SARS-CoV-2/FLU/RSV plus assay  is intended as an aid in the diagnosis of influenza from Nasopharyngeal swab specimens and should not be used as a sole basis for treatment. Nasal washings and aspirates are unacceptable for Xpert Xpress SARS-CoV-2/FLU/RSV testing.  Fact Sheet for Patients: EntrepreneurPulse.com.au  Fact Sheet for Healthcare Providers: IncredibleEmployment.be  This test is not yet approved or cleared by the [place] FDA and has been authorized for detection and/or diagnosis of SARS-CoV-2 by FDA under an Emergency Use Authorization (EUA). This EUA will remain in effect (meaning this test can be used) for the duration of the COVID-19 declaration under Section 564(b)(1) of the Act, 21 U.S.C. section 360bbb-3(b)(1), unless the authorization is terminated or revoked.  Performed at [facility], [address], [place], [place] [address]   Surgical pcr screen     Status: Abnormal   Collection Time: 10/17/21  6:07 AM   Specimen: Nasal Mucosa; Nasal Swab  Result Value Ref Range Status   MRSA, PCR NEGATIVE NEGATIVE Final   Staphylococcus aureus POSITIVE (A) NEGATIVE Final    Comment: CRITICAL RESULT CALLED TO, READ BACK BY AND VERIFIED WITH: JESSIE, RN @ [phone] ON 10/17/2021 BY LBROOKS, MLT (NOTE) The Xpert SA Assay (FDA approved for NASAL specimens in patients 45 years of age and older), is one component of a comprehensive surveillance program. It is not intended to diagnose infection nor to guide or monitor treatment. Performed at [facility], [place] [address], [place], [place] [address]      Time coordinating discharge:  I have spent 35 minutes face to face with the patient and on the ward discussing the patients care, assessment,  plan and disposition with other care givers. >50% of the time was devoted counseling the patient about the risks and benefits of treatment/Discharge disposition and coordinating care.   SIGNED:   Damita Lack, MD  [facility] 10/18/2021, 2:39 PM   If 7PM-7AM, please contact night-coverage

## 2021-10-18 NOTE — Evaluation (Signed)
Physical Therapy Evaluation Patient Details Name: Beth [name] MRN: [id number] DOB: 05-Feb-1961 Today's Date: 10/18/2021  History of Present Illness  60 yo female admitted with femur fx after falling at home. S/P IM nail/ORIF 10/17/21. Hx of R ankle fx s/p ORIF 01/2021, R femur fx s/p ORIF 01/2021, COPD, lung ca, polysubstance abuse, [facility] 2012  Clinical Impression  On eval, pt required Min guard-Min A for mobility. She walked ~20 feet with a RW. Moderate pain with activity. Anticipate pt should be able to d/c home with 24/7 supervision/assist once medically ready. Will recommend HHPT f/u if at all possible. Will follow during hospital stay.        Recommendations for follow up therapy are one component of a multi-disciplinary discharge planning process, led by the attending physician.  Recommendations may be updated based on patient status, additional functional criteria and insurance authorization.  Follow Up Recommendations Home health PT    Assistance Recommended at Discharge Frequent or constant Supervision/Assistance  Functional Status Assessment Patient has had a recent decline in their functional status and demonstrates the ability to make significant improvements in function in a reasonable and predictable amount of time.  Equipment Recommendations  None recommended by PT    Recommendations for Other Services       Precautions / Restrictions Precautions Precautions: Fall Restrictions Weight Bearing Restrictions: Yes RLE Weight Bearing: Partial weight bearing RLE Partial Weight Bearing Percentage or Pounds: 50%      Mobility  Bed Mobility Overal bed mobility: Needs Assistance Bed Mobility: Supine to Sit;Sit to Supine     Supine to sit: Min assist;HOB elevated Sit to supine: Min assist;HOB elevated   General bed mobility comments: Assist for R LE    Transfers Overall transfer level: Needs assistance Equipment used: Rolling walker (2 wheels) Transfers: Sit to/from  Stand Sit to Stand: Min guard           General transfer comment: Min guard for safety.    Ambulation/Gait Ambulation/Gait assistance: Min guard Gait Distance (Feet): 20 Feet Assistive device: Rolling walker (2 wheels) Gait Pattern/deviations: Step-to pattern       General Gait Details: Cues for safety, technique, adherence to PWB status. Pt using NWB pattern intermittently-encouraged her to at least keep toes on floor for balance.  Stairs            Wheelchair Mobility    Modified Rankin (Stroke Patients Only)       Balance Overall balance assessment: Needs assistance;History of Falls         Standing balance support: Bilateral upper extremity supported Standing balance-Leahy Scale: Poor                               Pertinent Vitals/Pain Pain Assessment: Faces Faces Pain Scale: Hurts little more Pain Location: R LE Pain Descriptors / Indicators: Discomfort;Sore Pain Intervention(s): Premedicated before session    [place] expects to be discharged to:: Private residence Living Arrangements: Spouse/significant other Available Help at Discharge: Family;Available 24 hours/day Type of Home: House Home Access: Stairs to enter Entrance Stairs-Rails: Left;Right;Can reach both Entrance Stairs-Number of Steps: 2   Home Layout: One level Home Equipment: Conservation officer, nature (2 wheels);Crutches;BSC/3in1      Prior Function Prior Level of Function : Independent/Modified Independent                     Hand Dominance   Dominant Hand: Right  Extremity/Trunk Assessment   Upper Extremity Assessment Upper Extremity Assessment: Defer to OT evaluation    Lower Extremity Assessment Lower Extremity Assessment: Generalized weakness    Cervical / Trunk Assessment Cervical / Trunk Assessment: Normal  Communication   Communication: No difficulties  Cognition Arousal/Alertness: Awake/alert Behavior During Therapy: WFL for  tasks assessed/performed Overall Cognitive Status: Within Functional Limits for tasks assessed                                          General Comments      Exercises     Assessment/Plan    PT Assessment Patient needs continued PT services  PT Problem List Decreased strength;Decreased mobility;Decreased range of motion;Decreased activity tolerance;Decreased balance;Decreased knowledge of use of DME;Pain       PT Treatment Interventions DME instruction;Gait training;Therapeutic activities;Therapeutic exercise;Patient/family education;Balance training;Functional mobility training;Stair training    PT Goals (Current goals can be found in the Care Plan section)  Acute Rehab PT Goals Patient Stated Goal: home tomorrow PT Goal Formulation: With patient Time For Goal Achievement: 11/01/21 Potential to Achieve Goals: Good    Frequency Min 5X/week   Barriers to discharge        Co-evaluation               AM-PAC PT "6 Clicks" Mobility  Outcome Measure Help needed turning from your back to your side while in a flat bed without using bedrails?: A Little Help needed moving from lying on your back to sitting on the side of a flat bed without using bedrails?: A Little Help needed moving to and from a bed to a chair (including a wheelchair)?: A Little Help needed standing up from a chair using your arms (e.g., wheelchair or bedside chair)?: A Little Help needed to walk in hospital room?: A Little Help needed climbing 3-5 steps with a railing? : A Little 6 Click Score: 18    End of Session Equipment Utilized During Treatment: Gait belt Activity Tolerance: Patient tolerated treatment well Patient left: in bed;with call bell/phone within reach;with bed alarm set   PT Visit Diagnosis: Pain;Other abnormalities of gait and mobility (R26.89);History of falling (Z91.81);Repeated falls (R29.6) Pain - Right/Left: Right Pain - part of body: Hip    Time:  1125-1135 PT Time Calculation (min) (ACUTE ONLY): 10 min   Charges:   PT Evaluation $PT Eval Moderate Complexity: 1 Mod           Doreatha Massed, PT Acute Rehabilitation  Office: [phone] Pager: [phone]

## 2021-10-18 NOTE — TOC Transition Note (Signed)
Transition of Care [facility]) - CM/SW Discharge Note  Patient Details  Name: DEVINE DANT MRN: [id number] Date of Birth: 1961-08-28  [facility] of [facility]) CM/SW Contact:  Sherie Don, LCSW Phone Number: 10/18/2021, 3:32 PM  Clinical Narrative: PT evaluation recommended HHPT. CSW made [facility] referrals to the following agencies:  [facility]: out of network [facility]: out of Scientist, physiological: declined [facility]: declined [facility]: out of network [facility]: declined Nurse, learning disability: declined [facility]: declined  CSW spoke with patient regarding no [place] agencies accepting the referral. Per patient, she had to do OPPT near [facility] in March when she broke her ankle as no HH agency would accept the referral at that time either. Patient has a rolling walker and 3N1 at home. Hospitalist notified. OPPT referral placed. TOC signing off.  Final next level of care: OP Rehab Barriers to Discharge: No [place] will accept this patient  Patient Goals and CMS Choice Patient states their goals for this hospitalization and ongoing recovery are:: Return home CMS Medicare.gov Compare Post Acute Care list provided to:: Patient Choice offered to / list presented to : NA  Discharge Plan and Services      DME Arranged: N/A DME Agency: NA  Readmission Risk Interventions No flowsheet data found.

## 2021-10-18 NOTE — Progress Notes (Addendum)
Physical Therapy Treatment Patient Details Name: Beth [name] MRN: [id number] DOB: 05-17-1961 Today's Date: 10/18/2021   History of Present Illness 60 yo female admitted with femur fx after falling at home. S/P IM nail/ORIF 10/17/21. Hx of R ankle fx s/p ORIF 01/2021, R femur fx s/p ORIF 01/2021, COPD, lung ca, polysubstance abuse, [place] 2012    PT Comments    Called by RN and informed pt is set to d/c home on TODAY. Pt continues to participate well. Reviewed/practiced gait training and stair training. No further questions/concerns from pt. All education completed.    Recommendations for follow up therapy are one component of a multi-disciplinary discharge planning process, led by the attending physician.  Recommendations may be updated based on patient status, additional functional criteria and insurance authorization.  Follow Up Recommendations  Home health PT (may need OPPT if [facility] not an option)     Assistance Recommended at Discharge Frequent or constant Supervision/Assistance  Equipment Recommendations  None recommended by PT    Recommendations for Other Services       Precautions / Restrictions Precautions Precautions: Fall Restrictions Weight Bearing Restrictions: Yes RLE Weight Bearing: Partial weight bearing RLE Partial Weight Bearing Percentage or Pounds: 50%     Mobility  Bed Mobility Overal bed mobility: Needs Assistance Bed Mobility: Supine to Sit     Supine to sit: Min guard Sit to supine: Min assist;HOB elevated   General bed mobility comments: Pt used gait belt to assist R LE off bed.    Transfers Overall transfer level: Needs assistance Equipment used: Rolling walker (2 wheels) Transfers: Sit to/from Stand Sit to Stand: Min guard           General transfer comment: Min guard for safety.    Ambulation/Gait Ambulation/Gait assistance: Min guard Gait Distance (Feet): 75 Feet Assistive device: Rolling walker (2 wheels) Gait Pattern/deviations:  Step-to pattern       General Gait Details: Cues for safety, technique, adherence to PWB status. Pt using NWB pattern intermittently-encouraged her to at least keep toes on floor for balance-also reminded her that she is allowed 50% WBing.   Stairs Stairs: Yes Min Assist  Stair Management: One rail Left;Step to pattern;Forwards;With crutches Number of Stairs: 3 General stair comments: Up and down portable steps with 1 crutch, 1 rail. Again, pt tends to use NWB technique-encouraged her to at least keep toes on step for balance-also reminded her that she is allowed 50% weight on that R leg   Wheelchair Mobility    Modified Rankin (Stroke Patients Only)       Balance Overall balance assessment: Needs assistance;History of Falls         Standing balance support: Bilateral upper extremity supported Standing balance-Leahy Scale: Poor                              Cognition Arousal/Alertness: Awake/alert Behavior During Therapy: WFL for tasks assessed/performed Overall Cognitive Status: Within Functional Limits for tasks assessed                                          Exercises      General Comments        Pertinent Vitals/Pain Pain Assessment: 0-10 Faces Pain Scale: Hurts little more Pain Location: R LE Pain Descriptors / Indicators: Discomfort;Sore Pain Intervention(s): Limited activity within patient's  tolerance;Monitored during session    [place] expects to be discharged to:: Private residence Living Arrangements: Spouse/significant other Available Help at Discharge: Family;Available 24 hours/day Type of Home: House Home Access: Stairs to enter Entrance Stairs-Rails: Left;Right;Can reach both Entrance Stairs-Number of Steps: 2   Home Layout: One level Home Equipment: Conservation officer, nature (2 wheels);Crutches;BSC/3in1      Prior Function            PT Goals (current goals can now be found in the care plan  section) Acute Rehab PT Goals Patient Stated Goal: home tomorrow PT Goal Formulation: With patient Time For Goal Achievement: 11/01/21 Potential to Achieve Goals: Good Progress towards PT goals: Progressing toward goals    Frequency    Min 5X/week      PT Plan Current plan remains appropriate    Co-evaluation              AM-PAC PT "6 Clicks" Mobility   Outcome Measure  Help needed turning from your back to your side while in a flat bed without using bedrails?: A Little Help needed moving from lying on your back to sitting on the side of a flat bed without using bedrails?: A Little Help needed moving to and from a bed to a chair (including a wheelchair)?: A Little Help needed standing up from a chair using your arms (e.g., wheelchair or bedside chair)?: A Little Help needed to walk in hospital room?: A Little Help needed climbing 3-5 steps with a railing? : A Little 6 Click Score: 18    End of Session Equipment Utilized During Treatment: Gait belt Activity Tolerance: Patient tolerated treatment well Patient left: in bed;with call bell/phone within reach;with bed alarm set   PT Visit Diagnosis: Pain;Other abnormalities of gait and mobility (R26.89);History of falling (Z91.81);Repeated falls (R29.6) Pain - Right/Left: Right Pain - part of body: Hip     Time: 5790-3833 PT Time Calculation (min) (ACUTE ONLY): 18 min  Charges:  $Gait Training: 8-22 mins                        Doreatha Massed, PT Acute Rehabilitation  Office: [phone] Pager: [phone]

## 2021-10-18 NOTE — Discharge Instructions (Signed)

## 2021-10-18 NOTE — Progress Notes (Signed)
Initial Nutrition Assessment  DOCUMENTATION CODES:  Severe malnutrition in context of chronic illness, Underweight  INTERVENTION:  Continue regular diet.  Increase Ensure from BID to TID.  Continue CIWA protocol.  Encourage PO and supplement intake.  NUTRITION DIAGNOSIS:  Severe Malnutrition related to chronic illness (COPD, EtOH abuse) as evidenced by severe fat depletion, severe muscle depletion.  GOAL:  Patient will meet greater than or equal to 90% of their needs  MONITOR:  PO intake, Supplement acceptance, Labs, Weight trends, Skin, I & O's  REASON FOR ASSESSMENT:  Consult Hip fracture protocol  ASSESSMENT:  60 yo female with a PMH of hypertension, alcohol abuse, COPD and tobacco abuse who presents to the emergency department via EMS due to right hip pain sustained after slipping of the 2-3 stairs at the back of her house, she was sent to [facility], ED found to have right hip fracture, Ortho consulted recommended patient be admitted to [facility]. 11/20 - Closed reduction intramedullary nailing  Spoke with pt at bedside. Pt reports eating what she wants when she wants to. Pt reports exhaustion this morning due to being woken up during the night.  Per Epic, pt ate 25% of dinner last night.   She endorses some recent weight loss, but is unsure of how much or over how long it has been happening for.   Per Epic, pt has lost ~6 lbs (6.6%) in the past 8 months, which is not necessarily significant for the time frame, but still concerning.  Recommend increasing Ensure from BID to TID and continuing CIWA protocol.  Supplements: Ensure BID  Medications: reviewed; colace BID, folic acid, MVI with minerals, thiamine, Vicodin PO PRN (given once today)  Labs: reviewed; Na 133 (L), K 3.3 (L), Glucose 163 (H), Mag 1.5 (L)  NUTRITION - FOCUSED PHYSICAL EXAM: Flowsheet Row Most Recent Value  Orbital Region Severe depletion  Upper Arm Region Severe depletion  Thoracic and  Lumbar Region Severe depletion  Buccal Region Severe depletion  Temple Region Severe depletion  Clavicle Bone Region Severe depletion  Clavicle and Acromion Bone Region Severe depletion  Scapular Bone Region Severe depletion  Dorsal Hand Severe depletion  Patellar Region Severe depletion  Anterior Thigh Region Severe depletion  Posterior Calf Region Severe depletion  Edema (RD Assessment) None  Hair Reviewed  Eyes Reviewed  Mouth Reviewed  Skin Reviewed  Nails Reviewed   Diet Order:   Diet Order             Diet regular Room service appropriate? Yes; Fluid consistency: Thin  Diet effective now                  EDUCATION NEEDS:  Education needs have been addressed  Skin:  Skin Assessment: Skin Integrity Issues: Skin Integrity Issues:: Incisions Incisions: R hip, closed (11/20)  Last BM:  10/18/21 - Type 4, medium  Height:  Ht Readings from Last 1 Encounters:  10/16/21 5\' 3"  (1.6 m)   Weight:  Wt Readings from Last 1 Encounters:  10/16/21 37.6 kg   BMI:  Body mass index is 14.7 kg/m.  Estimated Nutritional Needs:  Kcal:  1600-1800 Protein:  55-70 grams Fluid:  >1.6 L  Derrel Nip, RD, LDN (she/her/hers) Clinical Inpatient Dietitian RD Pager/After-Hours/Weekend Pager # in [place]

## 2021-10-18 NOTE — Progress Notes (Signed)
Patient ID: Beth [name], female   DOB: 08-May-1961, 60 y.o.   MRN: [id number] Subjective: 1 Day Post-Op Procedure(s) (LRB): INTRAMEDULLARY (IM) NAIL INTERTROCHANTRIC (Right)    Patient reports pain as mild. No OOB activity yet. No events over night  Objective:   VITALS:   Vitals:   10/18/21 0000 10/18/21 0601  BP: 125/71 126/77  Pulse: 76 80  Resp: 18 14  Temp: 99.2 F (37.3 C) 98.4 F (36.9 C)  SpO2: 99% 99%    Neurovascular intact Incision: dressing C/D/I  LABS Recent Labs    10/16/21 2030 10/17/21 0335 10/18/21 0348  HGB 12.1 11.8* 11.3*  HCT 36.7 36.0 34.0*  WBC 12.3* 10.4 12.3*  PLT 368 336 319    Recent Labs    10/16/21 2030 10/17/21 0335 10/18/21 0348  NA 135 138 133*  K 3.3* 3.8 3.3*  BUN 9 10 8   CREATININE 0.34* 0.42* 0.33*  GLUCOSE 103* 104* 163*    No results for input(s): LABPT, INR in the last 72 hours.   Assessment/Plan: 1 Day Post-Op Procedure(s) (LRB): INTRAMEDULLARY (IM) NAIL INTERTROCHANTRIC (Right)   Advance diet Up with therapy - PWB RLE Aspirin for DVT prophylaxis for 4 weeks Norco for pain RTC in 2 weeks

## 2021-11-09 ENCOUNTER — Ambulatory Visit (HOSPITAL_COMMUNITY): Payer: Medicaid Other | Admitting: Physical Therapy

## 2021-11-25 ENCOUNTER — Ambulatory Visit
Admission: RE | Admit: 2021-11-25 | Discharge: 2021-11-25 | Disposition: A | Discharge status: Home / Self Care | Payer: Medicaid Other | Source: Ambulatory Visit | Attending: Internal Medicine | Admitting: Internal Medicine

## 2021-11-25 ENCOUNTER — Ambulatory Visit: Payer: Medicaid Other

## 2021-11-25 DIAGNOSIS — Z1231 Encounter for screening mammogram for malignant neoplasm of breast: Secondary | ICD-10-CM

## 2021-12-08 ENCOUNTER — Ambulatory Visit (HOSPITAL_COMMUNITY): Payer: Medicaid Other | Attending: Internal Medicine

## 2022-10-24 ENCOUNTER — Other Ambulatory Visit: Payer: Self-pay | Admitting: Internal Medicine

## 2022-10-24 DIAGNOSIS — Z1231 Encounter for screening mammogram for malignant neoplasm of breast: Secondary | ICD-10-CM

## 2022-11-23 ENCOUNTER — Other Ambulatory Visit: Payer: Self-pay

## 2022-11-23 ENCOUNTER — Emergency Department (HOSPITAL_COMMUNITY)
Admission: EM | Admit: 2022-11-23 | Discharge: 2022-11-24 | Disposition: A | Discharge status: Home / Self Care | Payer: Medicaid Other | Attending: Emergency Medicine | Admitting: Emergency Medicine

## 2022-11-23 ENCOUNTER — Encounter (HOSPITAL_COMMUNITY): Payer: Self-pay

## 2022-11-23 DIAGNOSIS — N898 Other specified noninflammatory disorders of vagina: Secondary | ICD-10-CM | POA: Insufficient documentation

## 2022-11-23 DIAGNOSIS — R531 Weakness: Secondary | ICD-10-CM | POA: Diagnosis not present

## 2022-11-23 DIAGNOSIS — R202 Paresthesia of skin: Secondary | ICD-10-CM | POA: Diagnosis not present

## 2022-11-23 LAB — COMPREHENSIVE METABOLIC PANEL
ALT: 14 U/L (ref 0–44)
AST: 16 U/L (ref 15–41)
Albumin: 3.8 g/dL (ref 3.5–5.0)
Alkaline Phosphatase: 51 U/L (ref 38–126)
Anion gap: 9 (ref 5–15)
BUN: 6 mg/dL (ref 6–20)
CO2: 22 mmol/L (ref 22–32)
Calcium: 9 mg/dL (ref 8.9–10.3)
Chloride: 106 mmol/L (ref 98–111)
Creatinine, Ser: 0.38 mg/dL — ABNORMAL LOW (ref 0.44–1.00)
GFR, Estimated: >60 mL/min (ref >60)
Glucose, Bld: 97 mg/dL (ref 70–99)
Potassium: 3.4 mmol/L — ABNORMAL LOW (ref 3.5–5.1)
Sodium: 137 mmol/L (ref 135–145)
Total Bilirubin: 0.9 mg/dL (ref 0.3–1.2)
Total Protein: 7 g/dL (ref 6.5–8.1)

## 2022-11-23 LAB — CBC
HCT: 40.4 % (ref 36.0–46.0)
Hemoglobin: 13.2 g/dL (ref 12.0–15.0)
MCH: 30.9 pg (ref 26.0–34.0)
MCHC: 32.7 g/dL (ref 30.0–36.0)
MCV: 94.6 fL (ref 80.0–100.0)
Platelets: 387 10*3/uL (ref 150–400)
RBC: 4.27 MIL/uL (ref 3.87–5.11)
RDW: 13.3 % (ref 11.5–15.5)
WBC: 11.3 10*3/uL — ABNORMAL HIGH (ref 4.0–10.5)
nRBC: 0 % (ref 0.0–0.2)

## 2022-11-23 NOTE — ED Notes (Signed)
Upon this RN's assessment,pt is alert and oriented x 4. She c/o generalized weakness in bilateral upper and lower extremities. Equal strength in bilateral grips but is weak.  Denies changes in vision.  No drift noted in bilateral lower or upper extremities.  She does c/o foul smelling brown vaginal discharge that started around Saturday. She reports she previously seen Dr Glo Herring for same and had a rare ovarian infection. Denies dysuria or hematuria. Bryson Corona Edd Fabian

## 2022-11-23 NOTE — ED Notes (Signed)
Patient states she fell and was weak and states she has coordination and balance issues since and normally does not use a walker and has been having to use one.

## 2022-11-23 NOTE — ED Triage Notes (Signed)
Patient states hand numbness with feet numbness for about a month. Patient states increased weakness in legs in that. Patient had diarrhea this morning. Hx of electrolyte imbalance.

## 2022-11-24 LAB — URINALYSIS, ROUTINE W REFLEX MICROSCOPIC
Bilirubin Urine: NEGATIVE
Glucose, UA: NEGATIVE mg/dL
Ketones, ur: NEGATIVE mg/dL
Nitrite: POSITIVE — AB
Protein, ur: NEGATIVE mg/dL
Specific Gravity, Urine: 1.01 (ref 1.005–1.030)
WBC, UA: >50 WBC/hpf — ABNORMAL HIGH (ref 0–5)
pH: 6 (ref 5.0–8.0)

## 2022-11-24 LAB — WET PREP, GENITAL
Sperm: NONE SEEN
Trich, Wet Prep: NONE SEEN
WBC, Wet Prep HPF POC: NONE SEEN — AB (ref <10)
Yeast Wet Prep HPF POC: >=10 — AB

## 2022-11-24 LAB — HIV ANTIBODY (ROUTINE TESTING W REFLEX): HIV Screen 4th Generation wRfx: NONREACTIVE

## 2022-11-24 MED ORDER — METRONIDAZOLE 500 MG PO TABS: 500.0000 mg | ORAL_TABLET | Freq: Two times a day (BID) | ORAL | 0 refills | Status: DC

## 2022-11-24 MED ORDER — FLUCONAZOLE 150 MG PO TABS
150.0000 mg | ORAL_TABLET | Freq: Once | ORAL | Status: AC
  Administered 2022-11-24: 150 mg via ORAL
  Filled 2022-11-24: qty 1

## 2022-11-24 NOTE — ED Provider Notes (Signed)
[facility] EMERGENCY DEPARTMENT Provider Note   CSN: [id number] Arrival date & time: 11/23/22  1528     History  Chief Complaint  Patient presents with   Weakness    Beth [name] is a 61 y.o. female.  Patient presents to the emergency department with multiple complaints.  Patient reports that she has been feeling very weak.  Weakness is generalized and she has some tingling in her upper and lower extremities that is symmetric bilaterally.  No headache, neck pain, back pain.  She has not had any fever or flu symptoms.  Patient does, however, reports that she has had some brown vaginal discharge that started 3 or 4 days ago.  She is concerned because she does have a history of pyometra which was treated by Dr. Glo Herring some years ago.       Home Medications Prior to Admission medications   Medication Sig Start Date End Date Taking? Authorizing Provider  acetaminophen (TYLENOL) 500 MG tablet Take 1,000 mg by mouth daily as needed for headache.   Yes [provider]  albuterol (PROVENTIL HFA;VENTOLIN HFA) 108 (90 BASE) MCG/ACT inhaler Inhale 1-2 puffs into the lungs every 6 (six) hours as needed for wheezing or shortness of breath.   Yes [provider]  albuterol (PROVENTIL) (2.5 MG/3ML) 0.083% nebulizer solution Take 2.5 mg by nebulization every 6 (six) hours as needed for wheezing or shortness of breath.   Yes [provider]  alendronate (FOSAMAX) 70 MG tablet Take 70 mg by mouth once a week.   Yes [provider]  Cholecalciferol (VITAMIN D3) 250 MCG (10000 UT) TABS Take 10,000 Units by mouth every morning.   Yes [provider]  docusate sodium (COLACE) 100 MG capsule Take 1 capsule (100 mg total) by mouth 2 (two) times daily. 10/18/21  Yes Amin, Jeanella Flattery, MD  Fluticasone-Umeclidin-Vilant (TRELEGY ELLIPTA) 100-62.5-25 MCG/ACT AEPB Inhale 1 puff into the lungs daily.   Yes [provider]  Magnesium 250 MG TABS Take 250 mg  by mouth every morning.   Yes [provider]  Multiple Vitamin (MULTIVITAMIN WITH MINERALS) TABS tablet Take 1 tablet by mouth every morning.   Yes [provider]  naproxen sodium (ALEVE) 220 MG tablet Take 220 mg by mouth daily as needed (headache).   Yes [provider]  oxyCODONE-acetaminophen (PERCOCET/ROXICET) 5-325 MG tablet Take 1 tablet by mouth 4 (four) times daily as needed (pain). 10/11/21  Yes [provider]  promethazine (PHENERGAN) 25 MG tablet Take 25 mg by mouth every 6 (six) hours as needed. 11/04/22  Yes [provider]  thiamine 100 MG tablet Take 1 tablet (100 mg total) by mouth daily. 02/17/21  Yes Shelly Coss, MD      Allergies    Amoxicillin and Codeine    Review of Systems   Review of Systems  Physical Exam Updated Vital Signs BP 117/77   Pulse 62   Temp 98.2 F (36.8 C) (Oral)   Resp (!) 22   Ht 5\' 2"  (1.575 m)   Wt 35.4 kg   LMP 10/08/2019 Comment: Still spotting since November 10  SpO2 95%   BMI 14.27 kg/m  Physical Exam Vitals and nursing note reviewed. Exam conducted with a chaperone present.  Constitutional:      General: She is not in acute distress.    Appearance: She is well-developed and underweight.  HENT:     Head: Normocephalic and atraumatic.     Mouth/Throat:  Mouth: Mucous membranes are moist.  Eyes:     General: Vision grossly intact. Gaze aligned appropriately.     Extraocular Movements: Extraocular movements intact.     Conjunctiva/sclera: Conjunctivae normal.  Cardiovascular:     Rate and Rhythm: Normal rate and regular rhythm.     Pulses: Normal pulses.     Heart sounds: Normal heart sounds, S1 normal and S2 normal. No murmur heard.    No friction rub. No gallop.  Pulmonary:     Effort: Pulmonary effort is normal. No respiratory distress.     Breath sounds: Normal breath sounds.  Abdominal:     General: Bowel sounds are normal.     Palpations: Abdomen is soft.      Tenderness: There is no abdominal tenderness. There is no guarding or rebound.     Hernia: No hernia is present. There is no hernia in the left inguinal area or right inguinal area.  Genitourinary:    Labia:        Right: No rash or lesion.        Left: No rash or lesion.      Vagina: Normal. No vaginal discharge.     Cervix: No cervical motion tenderness, discharge, friability, erythema or cervical bleeding.     Uterus: Normal.      Adnexa: Right adnexa normal and left adnexa normal.  Musculoskeletal:        General: No swelling.     Cervical back: Full passive range of motion without pain, normal range of motion and neck supple. No spinous process tenderness or muscular tenderness. Normal range of motion.     Right lower leg: No edema.     Left lower leg: No edema.  Lymphadenopathy:     Lower Body: No right inguinal adenopathy. No left inguinal adenopathy.  Skin:    General: Skin is warm and dry.     Capillary Refill: Capillary refill takes less than 2 seconds.     Findings: No ecchymosis, erythema, rash or wound.  Neurological:     General: No focal deficit present.     Mental Status: She is alert and oriented to person, place, and time.     GCS: GCS eye subscore is 4. GCS verbal subscore is 5. GCS motor subscore is 6.     Cranial Nerves: Cranial nerves 2-12 are intact.     Sensory: Sensation is intact.     Motor: Motor function is intact.     Coordination: Coordination is intact.     Comments: Extraocular muscle movement: normal No visual field cut Pupils: equal and reactive both direct and consensual response is normal No nystagmus present    Sensory function is intact to light touch, pinprick Proprioception intact  Grip strength 5/5 symmetric in upper extremities No pronator drift Normal finger to nose bilaterally  Lower extremity strength 5/5 against gravity Normal heel to shin bilaterally  Gait: normal   Psychiatric:        Attention and Perception: Attention  normal.        Mood and Affect: Mood normal.        Speech: Speech normal.        Behavior: Behavior normal.     ED Results / Procedures / Treatments   Labs (all labs ordered are listed, but only abnormal results are displayed) Labs Reviewed  COMPREHENSIVE METABOLIC PANEL - Abnormal; Notable for the following components:      Result Value   Potassium 3.4 (*)  Creatinine, Ser 0.38 (*)    All other components within normal limits  CBC - Abnormal; Notable for the following components:   WBC 11.3 (*)    All other components within normal limits  WET PREP, GENITAL  AEROBIC CULTURE W GRAM STAIN (SUPERFICIAL SPECIMEN)  HIV ANTIBODY (ROUTINE TESTING W REFLEX)  URINALYSIS, ROUTINE W REFLEX MICROSCOPIC  GC/CHLAMYDIA PROBE AMP ([place]) NOT AT [facility]    EKG None  Radiology No results found.  Procedures Procedures    Medications Ordered in ED Medications - No data to display  ED Course/ Medical Decision Making/ A&P                           Medical Decision Making Amount and/or Complexity of Data Reviewed Labs: ordered.   Patient complains of bilateral, symmetric tingling and weakness of all of her extremities.  Neurologic exam is unremarkable.  As it is symmetric, no concern for stroke.  She does not have any cervical thoracic or lumbar spine symptoms or pain that would suggest any CNS involvement.  Lab work is unremarkable.  She appears well, vital signs are normal and she is afebrile.  Patient is concerned because she reports that she had a rare gynecologic infection.  When I did review her records, indeed she does have a history of pyometra.  It appears that she had bleeding and purulent discharge when this was present and it was successfully treated outpatient with Flagyl.  I doubt that this is a recurrent pyometra.  She appears well.  Pelvic exam was entirely normal.  There is no discharge from the cervix, no cervical motion tenderness, no uterine tenderness.  Will  empirically restart Flagyl, follow-up with OB/GYN.        Final Clinical Impression(s) / ED Diagnoses Final diagnoses:  Vaginal discharge    Rx / DC Orders ED Discharge Orders     None         Quianna Avery, Gwenyth Allegra, MD 11/24/22 0105

## 2022-11-25 LAB — GC/CHLAMYDIA PROBE AMP (~~LOC~~) NOT AT ARMC
Chlamydia: NEGATIVE
Comment: NEGATIVE
Comment: NORMAL
Neisseria Gonorrhea: NEGATIVE

## 2022-11-26 LAB — AEROBIC CULTURE W GRAM STAIN (SUPERFICIAL SPECIMEN): Gram Stain: NONE SEEN

## 2022-11-27 ENCOUNTER — Telehealth (HOSPITAL_BASED_OUTPATIENT_CLINIC_OR_DEPARTMENT_OTHER): Payer: Self-pay | Admitting: *Deleted

## 2022-11-27 NOTE — Progress Notes (Signed)
ED Antimicrobial Stewardship Positive Culture Follow Up   Beth [name] is an 61 y.o. female who presented to [facility] on 11/23/2022 with a chief complaint of  Chief Complaint  Patient presents with   Weakness    Recent Results (from the past 720 hour(s))  Aerobic Culture w Gram Stain (superficial specimen)     Status: None   Collection Time: 11/24/22  1:06 AM   Specimen: Cervix  Result Value Ref Range Status   Specimen Description   Final    CERVIX Performed at [facility], [address] [place], [place] [address]    Special Requests   Final    NONE Performed at [facility] [address] [place], [place] [address]    Gram Stain   Final    NO WBC SEEN ABUNDANT GRAM POSITIVE RODS RARE GRAM NEGATIVE RODS    Culture   Final    RARE ESCHERICHIA COLI FEW LACTOBACILLUS SPECIES Standardized susceptibility testing for this organism is not available. Performed at [facility], [place] [address], [place], [place] [address]    Report Status 11/26/2022 FINAL  Final   Organism ID, Bacteria ESCHERICHIA COLI  Final      Susceptibility   Escherichia coli - MIC*    AMPICILLIN 8 SENSITIVE Sensitive     CEFAZOLIN <=4 SENSITIVE Sensitive     CEFEPIME <=0.12 SENSITIVE Sensitive     CEFTAZIDIME <=1 SENSITIVE Sensitive     CEFTRIAXONE <=0.25 SENSITIVE Sensitive     CIPROFLOXACIN <=0.25 SENSITIVE Sensitive     GENTAMICIN <=1 SENSITIVE Sensitive     IMIPENEM <=0.25 SENSITIVE Sensitive     TRIMETH/SULFA <=20 SENSITIVE Sensitive     AMPICILLIN/SULBACTAM 4 SENSITIVE Sensitive     PIP/TAZO <=4 SENSITIVE Sensitive     * RARE ESCHERICHIA COLI  Wet prep, genital     Status: Abnormal   Collection Time: 11/24/22  1:11 AM   Specimen: Cervical/Vaginal swab  Result Value Ref Range Status   Yeast Wet Prep HPF POC >=10 (A) NONE SEEN Final   Trich, Wet Prep NONE SEEN NONE SEEN Final   Clue Cells Wet Prep HPF POC PRESENT (A) NONE SEEN Final   WBC, Wet Prep HPF POC NONE SEEN (A) <10 Final    Sperm NONE SEEN  Final    Comment: Performed at [facility], [address], [place], [place] [address]    [x]  Patient discharged originally without antimicrobial agent and treatment is now indicated   Continue flagyl will add antibiotic to cover E.Coli, patient is to follow up with OB per last documentation   New antibiotic prescription: Bactrim 1 DS PO BID x 5 days   ED Provider: Domenic Moras, PA-C    [name] 11/27/2022, 11:53 AM Clinical Pharmacist Monday - Friday phone -  [phone] Saturday - Sunday phone - [phone]

## 2022-11-27 NOTE — Telephone Encounter (Signed)
Post ED Visit - Positive Culture Follow-up: Unsuccessful Patient Follow-up  Culture assessed and recommendations reviewed by:  []  Elenor Quinones, Pharm.D. []  Heide Guile, Pharm.D., BCPS AQ-ID []  Parks Neptune, Pharm.D., BCPS []  Alycia Rossetti, Pharm.D., BCPS []  [place], Pharm.D., BCPS, AAHIVP []  Legrand Como, Pharm.D., BCPS, AAHIVP [x]  Esmeralda Arthur, PharmD,   Positive aerobic culture  [x]  Patient discharged without antimicrobial prescription and treatment is now indicated []  Organism is resistant to prescribed ED discharge antimicrobial []  Patient with positive blood cultures  Continue Flagyl Bactrim DS 1 PO BID x 5 days Unable to contact patient , letter will be sent to address on file  Rosie Fate 11/27/2022, 5:04 PM

## 2022-12-06 ENCOUNTER — Encounter: Payer: Medicaid Other | Admitting: Obstetrics & Gynecology

## 2022-12-08 NOTE — Telephone Encounter (Signed)
Received phone call from pt in response to letter sent to address on file.  Has F/U with OBGYN scheduled for today. No further treatment provided at this time.

## 2022-12-12 ENCOUNTER — Encounter: Payer: Medicaid Other | Admitting: Adult Health

## 2022-12-20 ENCOUNTER — Ambulatory Visit: Payer: Medicaid Other

## 2023-01-05 ENCOUNTER — Encounter: Payer: Self-pay | Admitting: Neurology

## 2023-01-10 ENCOUNTER — Encounter: Payer: Self-pay | Admitting: *Deleted

## 2023-01-18 NOTE — Progress Notes (Signed)
Initial neurology clinic note  SERVICE DATE: 01/26/22  Reason for Evaluation: Consultation requested by Sandi Mariscal, MD for an opinion regarding weakness and tingling. My final recommendations will be communicated back to the requesting physician by way of shared medical record or letter to requesting physician via [place] mail.  HPI: This is Ms. Newman Nickels, a 62 y.o. right-handed female with a medical history of chronic pain (knee, neck, back pain on chronic opioid management), pre-DM, COPD, migraine, HTN, vit D deficiency, HLD, EtOH abuse, current smoker who presents to neurology clinic with the chief complaint of weakness and tingling. The patient is accompanied by husband.  Patient's symptoms started in 08/2022. She had a lot of blood tests that were drawn. Later that day she was walking around her bed and her legs gave out and she fell to the floor. She does not think you were hurt. The next day her hands were tingling. A couple of weeks later her right leg started tingling (whole thing). The left leg started about 1 month ago. She is falling about once every 2 weeks. She feels week in arms and legs. Patient's husband agrees that patient's weakness came on very suddenly around 08/2022. She denies any trauma prior to symptoms or any known illness.  Patient has never taken a statin that she can recall.  Patient broke her hip in 09/2021 when a rug went out from her. This was not related to leg weakness.  Patient's normal weight is about 85 lbs. She is now down about 10 lbs since 08/2022. She is not eating as much as she was. She occasionally has night sweats, maybe twice per week at most.   Patient feels like she is caught up with cancer screening.  She denies cramps or twitching.  The patient denies symptoms suggestive of oculobulbar weakness including diplopia, ptosis, dysphagia, poor saliva control, dysarthria/dysphonia, impaired mastication, facial weakness/droop.  EtOH use: every couple of  weeks she will have a couple of drinks; prior to 2020, patient was a very strong drinker, up to 24 beers.  Restrictive diet? No Family history of neuropathy/myopathy/NM disease? No  She has never had an EMG.  MEDICATIONS:  Outpatient Encounter Medications as of 01/27/2023  Medication Sig Note   acetaminophen (TYLENOL) 500 MG tablet Take 1,000 mg by mouth daily as needed for headache.    albuterol (PROVENTIL HFA;VENTOLIN HFA) 108 (90 BASE) MCG/ACT inhaler Inhale 1-2 puffs into the lungs every 6 (six) hours as needed for wheezing or shortness of breath.    albuterol (PROVENTIL) (2.5 MG/3ML) 0.083% nebulizer solution Take 2.5 mg by nebulization every 6 (six) hours as needed for wheezing or shortness of breath.    alendronate (FOSAMAX) 70 MG tablet Take 70 mg by mouth once a week.    Cholecalciferol (VITAMIN D3) 250 MCG (10000 UT) TABS Take 10,000 Units by mouth every morning.    docusate sodium (COLACE) 100 MG capsule Take 1 capsule (100 mg total) by mouth 2 (two) times daily.    Fluticasone-Umeclidin-Vilant (TRELEGY ELLIPTA) 100-62.5-25 MCG/ACT AEPB Inhale 1 puff into the lungs daily.    Magnesium 250 MG TABS Take 250 mg by mouth every morning.    metroNIDAZOLE (FLAGYL) 500 MG tablet Take 1 tablet (500 mg total) by mouth 2 (two) times daily.    Multiple Vitamin (MULTIVITAMIN WITH MINERALS) TABS tablet Take 1 tablet by mouth every morning.    naproxen sodium (ALEVE) 220 MG tablet Take 220 mg by mouth daily as needed (headache).  oxyCODONE-acetaminophen (PERCOCET/ROXICET) 5-325 MG tablet Take 1 tablet by mouth 4 (four) times daily as needed (pain). 10/17/2021: #120 filled 10/11/2021 [facility] per PMP AWARE (bottle stored in pharmacy)   promethazine (PHENERGAN) 25 MG tablet Take 25 mg by mouth every 6 (six) hours as needed.    thiamine 100 MG tablet Take 1 tablet (100 mg total) by mouth daily.    No facility-administered encounter medications on file as of 01/27/2023.    PAST MEDICAL  HISTORY: Past Medical History:  Diagnosis Date   Alcohol abuse 10/26/2011   Brain bleed ([place]) 2012   Cervical cancer (HCC)    COPD (chronic obstructive pulmonary disease) (HCC)    Displaced fracture of lateral malleolus of right fibula, initial encounter for closed fracture 02/13/2021   Hypertension    Lung infection    Tobacco abuse     PAST SURGICAL HISTORY: Past Surgical History:  Procedure Laterality Date   BIOPSY  02/04/2020   Procedure: BIOPSY;  Surgeon: Danie Binder, MD;  [facility]: [facility];  Service: Endoscopy;;   BRONCHOSCOPY     COLONOSCOPY WITH PROPOFOL N/A 02/04/2020   Procedure: COLONOSCOPY WITH PROPOFOL;  Surgeon: Danie Binder, MD;  [facility]: [facility];  Service: Endoscopy;  Laterality: N/A;  8:45am   INTRAMEDULLARY (IM) NAIL INTERTROCHANTERIC Right 10/17/2021   Procedure: INTRAMEDULLARY (IM) NAIL INTERTROCHANTRIC;  Surgeon: [name], MD;  Location: [facility];  Service: Orthopedics;  Laterality: Right;   KNEE ARTHROSCOPY WITH MENISCAL REPAIR Right 08/25/2020   Procedure: KNEE ARTHROSCOPY WITH  MEDIAL MENISCAL REPAIR;  Surgeon: Carole Civil, MD;  Location: [facility];  Service: Orthopedics;  Laterality: Right;   MULTIPLE TOOTH EXTRACTIONS     ORIF ANKLE FRACTURE Right 02/14/2021   Procedure: OPEN REDUCTION INTERNAL FIXATION (ORIF) ANKLE FRACTURE;  Surgeon: [name], MD;  Location: [place];  Service: Orthopedics;  Laterality: Right;   ORIF FEMUR FRACTURE Right 02/14/2021   Procedure: OPEN REDUCTION INTERNAL FIXATION (ORIF) DISTAL FEMUR FRACTURE;  Surgeon: [name], MD;  Location: [place];  Service: Orthopedics;  Laterality: Right;   POLYPECTOMY  02/04/2020   Procedure: POLYPECTOMY;  Surgeon: Danie Binder, MD;  [facility];  Service: Endoscopy;;   rod in arm Left     ALLERGIES: Allergies  Allergen Reactions   Amoxicillin Nausea Only   Codeine Itching    FAMILY HISTORY: Family History  Problem Relation Age of Onset   Throat  cancer Mother    Lung cancer Father    Throat cancer Brother    Lung cancer Brother    Colon cancer Neg Hx    Breast cancer Neg Hx     SOCIAL HISTORY: Social History   Tobacco Use   Smoking status: Every Day    Packs/day: 0.25    Years: 35.00    Total pack years: 8.75    Types: Cigarettes   Smokeless tobacco: Never   Tobacco comments:    I pack a day  Vaping Use   Vaping Use: Never used  Substance Use Topics   Alcohol use: Not Currently    Comment: rarely   Drug use: Yes    Types: Marijuana    Comment: rarely   Social History   Social History Narrative   Are you right handed or left handed? Right   Are you currently employed ?    What is your current occupation?disability   Do you live at home alone?   Who lives with you? husband  What type of home do you live in: 1 story or 2 story? one    Caffeine 2 cups of coffee, 1- 2 sodas     OBJECTIVE: PHYSICAL EXAM: BP 137/73   Pulse 76   Ht '5\' 2"'$  (1.575 m)   Wt 76 lb 14.4 oz (34.9 kg)   LMP 10/08/2019 Comment: Still spotting since November 10  SpO2 97%   BMI 14.07 kg/m   General: General appearance: Thin. Cachetic. No distress. Cooperative with exam.  HEENT: Atraumatic. Anicteric. Lungs: Non-labored breathing on room air  Extremities: No edema. No obvious deformity.  Psych: Affect appropriate.  Neurological: Mental Status: Alert. Speech fluent. No pseudobulbar affect Cranial Nerves: CNII: No RAPD. Visual fields grossly intact. CNIII, IV, VI: PERRL. No nystagmus. EOMI. CN V: Facial sensation intact bilaterally to fine touch. Masseter clench strong. Jaw jerk - equivocal. CN VII: Facial muscles symmetric and strong. No ptosis at rest. CN VIII: Hearing grossly intact bilaterally. CN IX: No hypophonia. CN X: Palate elevates symmetrically. CN XI: Full strength shoulder shrug bilaterally. CN XII: Tongue protrusion full and midline. No atrophy or fasciculations. No significant dysarthria Motor: Tone is  increased in all extremities. Positive for fasciculations in bilateral upper extremities (deltoid and biceps). None seen in lower extremities  Individual muscle group testing (MRC grade out of 5):  Movement     Neck flexion 5    Neck extension 5     Right Left   Shoulder abduction 4+ 4+   Shoulder adduction 5 5   Shoulder ext rotation 4+ 4+   Shoulder int rotation 5 5   Elbow flexion 5 5   Elbow extension 4+ 4   Wrist extension 5- 5-   Wrist flexion 5- 5-   Finger abduction - FDI 4 5-   Finger abduction - ADM 4+ 4   Finger extension 4+ 4+   Finger distal flexion - 2/3 4+ 4+   Finger distal flexion - 4/5 4+ 4+   Thumb flexion - FPL 4+ 4+   Thumb abduction - APB 4+ 4+    Hip flexion 5- 5   Hip extension 5 5   Hip adduction 5 5   Hip abduction 5 5   Knee extension 5 5   Knee flexion 5 5   Dorsiflexion 5- 5   Plantarflexion 5 5     Reflexes:  Right Left   Bicep 3+ 3+   Tricep 3+ 3+   BrRad 3+ 3+   Knee 3+ 3+   Ankle 3+ 3+ 3 beats of ankle clonus on right   Pathological Reflexes: Babinski: extensor response bilaterally Hoffman: present bilaterally Troemner: present bilaterally Pectoral: present Facial: absent bilaterally Midline tap: absent Sensation: Pinprick: Intact in bilateral upper extremities. Diminished in right medial lower leg and bilateral thighs Coordination: Intact finger-to- nose-finger bilaterally. Romberg positive. Gait: Able to rise from chair with arms crossed unassisted. Very unsteady, wobbly. Walks with walker. Stiff legged, spastic.  Lab and Test Review: External labs: CMP (11/04/22): unremarkable CBC (11/04/22): unremarkable HbA1c (11/04/22): 5.5 ESR (08/05/22): 5 ANA (08/05/22): negative RF (08/05/22): < 7 TSH (08/05/22): 0.628 Vit D (08/05/22): 64.75  Imaging: CT head and cervical spine wo contrast (02/12/22): FINDINGS: CT HEAD FINDINGS   Brain: No evidence of acute infarction, hemorrhage, hydrocephalus, extra-axial collection, visible mass  lesion or mass effect.   Vascular: Atherosclerotic calcification of the carotid siphons. No hyperdense vessel.   Skull: Chronic right supraorbital scarring. No acute soft tissue swelling, gas or laceration. No large  scalp hematoma. No calvarial fracture or acute osseous injury within the included levels of imaging.   Sinuses/Orbits: Chronic rightward anterior nasal septal deviation and in contacting left-sided nasal septal spur. Paranasal sinuses and mastoid air cells are predominantly clear. Middle ear cavities are clear. Included orbital structures are unremarkable.   Other: None   CT CERVICAL SPINE FINDINGS   Alignment: Slight exaggeration of the upper cervical lordosis. Unchanged likely degenerative stepwise anterolisthesis C4-C7. Grossly unchanged alignment of a nonunited type 2 dens fracture and the asymmetric positioning of the lateral masses C1-C2. No acute traumatic listhesis is seen.   Skull base and vertebrae: Redemonstration of the chronic, well corticated type 2 dens fracture with posterior translation and angulation. No acute cervical spine fracture is seen. No visible skull base fractures or other acute osseous abnormalities. No suspicious osseous lesions. Cervical spondylitic changes as below. Additional arthrosis about the atlantodental interval and basion dens intervals, unchanged from prior.   Soft tissues and spinal canal: No pre or paravertebral fluid or swelling. No visible canal hematoma.   Disc levels: Multilevel intervertebral disc height loss with spondylitic endplate changes. At most mild resulting canal stenosis C4-5, C5-6. Multilevel uncinate spurring and facet hypertrophic changes are present as well maximal C4-C7 where there is mild-to-moderate foraminal narrowing bilaterally.   Upper chest: No acute abnormality in the upper chest or imaged lung apices. Stable emphysematous changes.   Other: No concerning thyroid nodule or mass.    IMPRESSION: 1. No acute intracranial abnormality. 2. Grossly unchanged alignment of the chronic, well corticated type 2 dens fracture with posterior translation and angulation. 3. No acute cervical spine fracture or traumatic listhesis. 4. Multilevel degenerative changes of the cervical spine as described above.  ASSESSMENT: LASHE COYE is a 62 y.o. female who presents for evaluation of weakness and tingling. She has a relevant medical history of chronic pain (knee, neck, back pain on chronic opioid management), pre-DM, COPD, migraine, HTN, vit D deficiency, HLD, EtOH abuse, current smoker. Her neurological examination is pertinent for both proximal and distal weakness in upper extremities with minimal clear weakness in lower extremities. She has diminished sensation in bilateral lower extremities. She has hyperreflexia and spastic gait.   This is a complex case. Her symptoms are predominantly upper motor neuron and may best localize to the cervical spine. She does have a prior CT cervical spine from 01/2022 showing anterolisthesis at C4-C7 and a nonunited type 2 dens fracture and asymmetric positioning of the lateral masses at C1-C2. Her symptoms could be explained by cervical cord compression. If there is no evidence of this, then a mimic such as vitamin deficiency or a UMN predominant motor neuron disease is also possible. While she has a history of severe EtOH abuse, this is less likely neuropathy given UMN findings.  PLAN: -Blood work: B1, B12, folate, vit E, copper -MRI brain and cervical spine w/wo contrast - ordered stat. Patient understands that if she has acute worsening, she is to go to closest ED and mention my concern for cervical spine compression. -If MRI is EMG: MND protocol (R > L)  -Return to clinic in 1-2 months  The impression above as well as the plan as outlined below were extensively discussed with the patient (in the company of husband) who voiced understanding. All  questions were answered to their satisfaction.  The patient was counseled on pertinent fall precautions per the printed material provided today, and as noted under the "Patient Instructions" section below.  When available, results of  the above investigations and possible further recommendations will be communicated to the patient via telephone/MyChart. Patient to call office if not contacted after expected testing turnaround time.   Total time spent reviewing records, interview, history/exam, documentation, and coordination of care on day of encounter:  80 min   Thank you for allowing me to participate in patient's care.  If I can answer any additional questions, I would be pleased to do so.  Kai Levins, MD   CC: Sandi Mariscal, [place] [address]  CC: Referring provider: Sandi Mariscal, MD [address] [place],  [place] [address]

## 2023-01-27 ENCOUNTER — Ambulatory Visit: Payer: Medicaid Other | Admitting: Neurology

## 2023-01-27 ENCOUNTER — Encounter: Payer: Self-pay | Admitting: Neurology

## 2023-01-27 ENCOUNTER — Other Ambulatory Visit (INDEPENDENT_AMBULATORY_CARE_PROVIDER_SITE_OTHER): Payer: Medicaid Other

## 2023-01-27 VITALS — BP 137/73 | HR 76 | Ht 62.0 in | Wt 76.9 lb

## 2023-01-27 DIAGNOSIS — R292 Abnormal reflex: Secondary | ICD-10-CM

## 2023-01-27 DIAGNOSIS — R531 Weakness: Secondary | ICD-10-CM

## 2023-01-27 DIAGNOSIS — R2689 Other abnormalities of gait and mobility: Secondary | ICD-10-CM | POA: Diagnosis not present

## 2023-01-27 DIAGNOSIS — R269 Unspecified abnormalities of gait and mobility: Secondary | ICD-10-CM

## 2023-01-27 DIAGNOSIS — R202 Paresthesia of skin: Secondary | ICD-10-CM

## 2023-01-27 DIAGNOSIS — R200 Anesthesia of skin: Secondary | ICD-10-CM

## 2023-01-27 DIAGNOSIS — M6289 Other specified disorders of muscle: Secondary | ICD-10-CM

## 2023-01-27 NOTE — Patient Instructions (Addendum)
I saw you today for weakness and tingling. I would like to investigate your symptoms further with the following: -Blood work today -MRI of your brain and cervical spine (neck). I am asking for this ASAP. If you do not hear from someone by early next week to schedule, or if they are scheduling out more than 1 week, please call our office to let us know as I want it done quickly.  If your MRI does not show a problem, we will likely have to do muscle and nerve testing called an EMG. I am giving you more information on this below.  I will be in touch when I have your results. I want to see you back in clinic in 1-2 months.  The physicians and staff at [facility] are committed to providing excellent care. You may receive a survey requesting feedback about your experience at our office. We strive to receive "very good" responses to the survey questions. If you feel that your experience would prevent you from giving the office a "very good " response, please contact our office to try to remedy the situation. We may be reached at [phone]. Thank you for taking the time out of your busy day to complete the survey.  Kai Levins, MD [facility]  ELECTROMYOGRAM AND NERVE CONDUCTION STUDIES (EMG/NCS) INSTRUCTIONS  How to Prepare The neurologist conducting the EMG will need to know if you have certain medical conditions. Tell the neurologist and other EMG lab personnel if you: Have a pacemaker or any other electrical medical device Take blood-thinning medications Have hemophilia, a blood-clotting disorder that causes prolonged bleeding Bathing Take a shower or bath shortly before your exam in order to remove oils from your skin. Don't apply lotions or creams before the exam.  What to Expect You'll likely be asked to change into a hospital gown for the procedure and lie down on an examination table. The following explanations can help you understand what will happen during the exam.   Electrodes. The neurologist or a technician places surface electrodes at various locations on your skin depending on where you're experiencing symptoms. Or the neurologist may insert needle electrodes at different sites depending on your symptoms.  Sensations. The electrodes will at times transmit a tiny electrical current that you may feel as a twinge or spasm. The needle electrode may cause discomfort or pain that usually ends shortly after the needle is removed. If you are concerned about discomfort or pain, you may want to talk to the neurologist about taking a short break during the exam.  Instructions. During the needle EMG, the neurologist will assess whether there is any spontaneous electrical activity when the muscle is at rest - activity that isn't present in healthy muscle tissue - and the degree of activity when you slightly contract the muscle.  He or she will give you instructions on resting and contracting a muscle at appropriate times. Depending on what muscles and nerves the neurologist is examining, he or she may ask you to change positions during the exam.  After your EMG You may experience some temporary, minor bruising where the needle electrode was inserted into your muscle. This bruising should fade within several days. If it persists, contact your primary care doctor.   Preventing Falls at [facility] are common, often dreaded events in the lives of older people. Aside from the obvious injuries and even death that may result, fall can cause wide-ranging consequences including loss of independence, mental decline, decreased activity  and mobility. Younger people are also at risk of falling, especially those with chronic illnesses and fatigue.  Ways to reduce risk for falling Examine diet and medications. Warm foods and alcohol dilate blood vessels, which can lead to dizziness when standing. Sleep aids, antidepressants and pain medications can also increase the likelihood of a  fall.  Get a vision exam. Poor vision, cataracts and glaucoma increase the chances of falling.  Check foot gear. Shoes should fit snugly and have a sturdy, nonskid sole and a broad, low heel  Participate in a physician-approved exercise program to build and maintain muscle strength and improve balance and coordination. Programs that use ankle weights or stretch bands are excellent for muscle-strengthening. Water aerobics programs and low-impact Tai Chi programs have also been shown to improve balance and coordination.  Increase vitamin D intake. Vitamin D improves muscle strength and increases the amount of calcium the body is able to absorb and deposit in bones.  How to prevent falls from common hazards Floors - Remove all loose wires, cords, and throw rugs. Minimize clutter. Make sure rugs are anchored and smooth. Keep furniture in its usual place.  Chairs -- Use chairs with straight backs, armrests and firm seats. Add firm cushions to existing pieces to add height.  Bathroom - Install grab bars and non-skid tape in the tub or shower. Use a bathtub transfer bench or a shower chair with a back support Use an elevated toilet seat and/or safety rails to assist standing from a low surface. Do not use towel racks or bathroom tissue holders to help you stand.  Lighting - Make sure halls, stairways, and entrances are well-lit. Install a night light in your bathroom or hallway. Make sure there is a light switch at the top and bottom of the staircase. Turn lights on if you get up in the middle of the night. Make sure lamps or light switches are within reach of the bed if you have to get up during the night.  Kitchen - Install non-skid rubber mats near the sink and stove. Clean spills immediately. Store frequently used utensils, pots, pans between waist and eye level. This helps prevent reaching and bending. Sit when getting things out of lower cupboards.  Living room/ Bedrooms - Place furniture with  wide spaces in between, giving enough room to move around. Establish a route through the living room that gives you something to hold onto as you walk.  Stairs - Make sure treads, rails, and rugs are secure. Install a rail on both sides of the stairs. If stairs are a threat, it might be helpful to arrange most of your activities on the lower level to reduce the number of times you must climb the stairs.  Entrances and doorways - Install metal handles on the walls adjacent to the doorknobs of all doors to make it more secure as you travel through the doorway.  Tips for maintaining balance Keep at least one hand free at all times. Try using a backpack or fanny pack to hold things rather than carrying them in your hands. Never carry objects in both hands when walking as this interferes with keeping your balance.  Attempt to swing both arms from front to back while walking. This might require a conscious effort if Parkinson's disease has diminished your movement. It will, however, help you to maintain balance and posture, and reduce fatigue.  Consciously lift your feet off of the ground when walking. Shuffling and dragging of the feet is a  common culprit in losing your balance.  When trying to navigate turns, use a "U" technique of facing forward and making a wide turn, rather than pivoting sharply.  Try to stand with your feet shoulder-length apart. When your feet are close together for any length of time, you increase your risk of losing your balance and falling.  Do one thing at a time. Don't try to walk and accomplish another task, such as reading or looking around. The decrease in your automatic reflexes complicates motor function, so the less distraction, the better.  Do not wear rubber or gripping soled shoes, they might "catch" on the floor and cause tripping.  Move slowly when changing positions. Use deliberate, concentrated movements and, if needed, use a grab bar or walking aid. Count 15  seconds between each movement. For example, when rising from a seated position, wait 15 seconds after standing to begin walking.  If balance is a continuous problem, you might want to consider a walking aid such as a cane, walking stick, or walker. Once you've mastered walking with help, you might be ready to try it on your own again.

## 2023-01-31 ENCOUNTER — Telehealth: Payer: Self-pay

## 2023-01-31 NOTE — Telephone Encounter (Signed)
[facility] called back and said they call Beth [name] and she refused the 7th at 7am due to getting a ride from [place]. They are going to try and get her in before you get back from vacation.

## 2023-02-01 ENCOUNTER — Telehealth: Payer: Self-pay

## 2023-02-01 NOTE — Telephone Encounter (Signed)
-----   Message from Shellia Carwin, MD sent at 02/01/2023  1:45 PM EST ----- Regarding: Low B1 Algie Cales,  Can you let the patient know her B1 was low? I recommend she take B1 (also called thiamine) 100 mg every day. She can pick this up at the drug store over the counter or online.  Thank you,  Kai Levins, MD

## 2023-02-01 NOTE — Telephone Encounter (Signed)
Called and informed pt of results per Dr. Berdine Addison. Understood , I repeated it twice. She also reported her MRIs are scheduled for 3/9

## 2023-02-04 ENCOUNTER — Ambulatory Visit
Admission: RE | Admit: 2023-02-04 | Discharge: 2023-02-04 | Disposition: A | Discharge status: Home / Self Care | Payer: Medicaid Other | Source: Ambulatory Visit | Attending: Neurology | Admitting: Neurology

## 2023-02-04 DIAGNOSIS — R292 Abnormal reflex: Secondary | ICD-10-CM

## 2023-02-04 DIAGNOSIS — R531 Weakness: Secondary | ICD-10-CM

## 2023-02-04 DIAGNOSIS — M6289 Other specified disorders of muscle: Secondary | ICD-10-CM

## 2023-02-04 DIAGNOSIS — R269 Unspecified abnormalities of gait and mobility: Secondary | ICD-10-CM

## 2023-02-04 DIAGNOSIS — R2689 Other abnormalities of gait and mobility: Secondary | ICD-10-CM

## 2023-02-04 DIAGNOSIS — R200 Anesthesia of skin: Secondary | ICD-10-CM

## 2023-02-04 MED ORDER — GADOPICLENOL 0.5 MMOL/ML IV SOLN
5.0000 mL | Freq: Once | INTRAVENOUS | Status: AC | PRN
  Administered 2023-02-04: 5 mL via INTRAVENOUS

## 2023-02-05 NOTE — Progress Notes (Signed)
Can we call patient and let her know that her MRI cervical spine did show there was compression of her spinal cord and that I recommend referral to spine surgery ASAP? If she is okay with that, please order stat spine surgery consultation for severe cervical spine canal stenosis?  Her MRI brain was unchanged from previous which showed nothing new or concerning.   Thank you,  Kai Levins, MD

## 2023-02-06 ENCOUNTER — Other Ambulatory Visit: Payer: Self-pay

## 2023-02-06 DIAGNOSIS — G9520 Unspecified cord compression: Secondary | ICD-10-CM

## 2023-02-07 ENCOUNTER — Ambulatory Visit: Payer: Medicaid Other

## 2023-02-08 LAB — EXTRA SPECIMEN

## 2023-02-08 LAB — VITAMIN B1: Vitamin B1 (Thiamine): 7 nmol/L — ABNORMAL LOW (ref 8–30)

## 2023-02-08 LAB — COPPER, SERUM: Copper: 145 ug/dL (ref 70–175)

## 2023-02-08 LAB — VITAMIN B12: Vitamin B-12: 431 pg/mL (ref 200–1100)

## 2023-02-08 LAB — VITAMIN E
Gamma-Tocopherol (Vit E): <1 mg/L (ref <4.4)
Vitamin E (Alpha Tocopherol): 9.1 mg/L (ref 5.7–19.9)

## 2023-02-08 LAB — FOLATE: Folate: 8.7 ng/mL

## 2023-02-17 ENCOUNTER — Other Ambulatory Visit: Payer: Self-pay | Admitting: Neurological Surgery

## 2023-02-20 ENCOUNTER — Other Ambulatory Visit (HOSPITAL_COMMUNITY)
Admission: RE | Admit: 2023-02-20 | Discharge: 2023-02-20 | Disposition: A | Discharge status: Home / Self Care | Payer: Medicaid Other | Source: Ambulatory Visit | Attending: Adult Health | Admitting: Adult Health

## 2023-02-20 ENCOUNTER — Encounter: Payer: Self-pay | Admitting: Adult Health

## 2023-02-20 ENCOUNTER — Ambulatory Visit: Payer: Medicaid Other | Admitting: Adult Health

## 2023-02-20 VITALS — BP 128/80 | HR 83 | Ht 62.0 in | Wt 75.0 lb

## 2023-02-20 DIAGNOSIS — R102 Pelvic and perineal pain: Secondary | ICD-10-CM

## 2023-02-20 DIAGNOSIS — N898 Other specified noninflammatory disorders of vagina: Secondary | ICD-10-CM | POA: Diagnosis present

## 2023-02-20 NOTE — Progress Notes (Signed)
  Subjective:     Patient ID: Newman Nickels, female   DOB: 1961/08/14, 62 y.o.   MRN: [id number]  HPI [place] is a 62 year old white female,married, PM in complaining of vaginal discharge with odor, when had before, ovaries infected, has had some pelvic pain R>L.  She has history of pyometra years ago. She denies any vaginal bleeding but has seen brown in pull ups.  Last pap was negative in 2022 she says with Dr Nancy Fetter.  PCP is Dr Nancy Fetter   Review of Systems +vaginal discharge with odor +pelvic pain R>L Denies vaginal bleeding Not having sex Has some constipation at times Reviewed past medical,surgical, social and family history. Reviewed medications and allergies.     Objective:   Physical Exam BP 128/80 (BP Location: Left Arm, Patient Position: Sitting, Cuff Size: Normal)   Pulse 83   Ht 5\' 2"  (1.575 m)   Wt 75 lb (34 kg)   LMP 10/08/2019 Comment: Still spotting since November 10  BMI 13.72 kg/m     Skin warm and dry.Pelvic: external genitalia is normal in appearance no lesions, vagina: white discharge without odor,urethra has no lesions or masses noted, cervix:smooth and tiny, uterus: normal size, shape and contour, non tender, no masses felt, adnexa: no masses or tenderness noted. Bladder is non tender and no masses felt.CV swab obtained. Examination chaperoned by Levy Pupa LPN  Fall risk is high  [facility] - 02/20/23 1555       Pregnancy Intention Screening   Does the patient want to become pregnant in the next year? No    Does the patient's partner want to become pregnant in the next year? No    Would the patient like to discuss contraceptive options today? No      Contraception Wrap Up   Current Method Abstinence;No Method - Other Reason   postmenopausal   Reason for No Current Contraceptive Method at Intake ([facility] Only) Other    End Method Abstinence;No Method - Other Reason   postmenopausal   Contraception Counseling Provided No             Assessment:     1. Pelvic  pain +pelvic pain R>L Scheduled pelvic US 3/39/24 at 1:30 pm at [facility] to assess uterus and ovaries  - US PELVIC COMPLETE WITH TRANSVAGINAL; Future  2. Vaginal discharge CV swab sent for GC/CHL,trich,BV and yeast  - Cervicovaginal ancillary only( [place])     Plan:     Follow up TBD

## 2023-02-22 ENCOUNTER — Telehealth: Payer: Self-pay | Admitting: *Deleted

## 2023-02-22 ENCOUNTER — Other Ambulatory Visit: Payer: Self-pay | Admitting: Adult Health

## 2023-02-22 LAB — CERVICOVAGINAL ANCILLARY ONLY
Bacterial Vaginitis (gardnerella): POSITIVE — AB
Candida Glabrata: NEGATIVE
Candida Vaginitis: NEGATIVE
Chlamydia: NEGATIVE
Comment: NEGATIVE
Comment: NEGATIVE
Comment: NEGATIVE
Comment: NEGATIVE
Comment: NEGATIVE
Comment: NORMAL
Neisseria Gonorrhea: NEGATIVE
Trichomonas: NEGATIVE

## 2023-02-22 MED ORDER — METRONIDAZOLE 500 MG PO TABS: 500.0000 mg | ORAL_TABLET | Freq: Two times a day (BID) | ORAL | 0 refills | Status: DC

## 2023-02-22 NOTE — Progress Notes (Deleted)
I saw Beth [name] in neurology clinic on 03/01/23 in follow up for weakness and cervical spine stenosis.  HPI: Beth [name] is a 62 y.o. year old female with a history of chronic pain (knee, neck, back pain on chronic opioid management), pre-DM, COPD, migraine, HTN, vit D deficiency, HLD, EtOH abuse, current smoker who we last saw on 01/27/23.  To briefly review: Patient's symptoms started in 08/2022. She had a lot of blood tests that were drawn. Later that day she was walking around her bed and her legs gave out and she fell to the floor. She does not think you were hurt. The next day her hands were tingling. A couple of weeks later her right leg started tingling (whole thing). The left leg started about 1 month ago. She is falling about once every 2 weeks. She feels week in arms and legs. Patient's husband agrees that patient's weakness came on very suddenly around 08/2022. She denies any trauma prior to symptoms or any known illness.   Patient has never taken a statin that she can recall.   Patient broke her hip in 09/2021 when a rug went out from her. This was not related to leg weakness.   Patient's normal weight is about 85 lbs. She is now down about 10 lbs since 08/2022. She is not eating as much as she was. She occasionally has night sweats, maybe twice per week at most.    Patient feels like she is caught up with cancer screening.   She denies cramps or twitching.   The patient denies symptoms suggestive of oculobulbar weakness including diplopia, ptosis, dysphagia, poor saliva control, dysarthria/dysphonia, impaired mastication, facial weakness/droop.   EtOH use: every couple of weeks she will have a couple of drinks; prior to 2020, patient was a very strong drinker, up to 24 beers.  Restrictive diet? No Family history of neuropathy/myopathy/NM disease? No  Most recent Assessment and Plan (01/27/23): This is a complex case. Her symptoms are predominantly upper motor neuron and may  best localize to the cervical spine. She does have a prior CT cervical spine from 01/2022 showing anterolisthesis at C4-C7 and a nonunited type 2 dens fracture and asymmetric positioning of the lateral masses at C1-C2. Her symptoms could be explained by cervical cord compression. If there is no evidence of this, then a mimic such as vitamin deficiency or a UMN predominant motor neuron disease is also possible. While she has a history of severe EtOH abuse, this is less likely neuropathy given UMN findings.   PLAN: -Blood work: B1, B12, folate, vit E, copper -MRI brain and cervical spine w/wo contrast - ordered stat. Patient understands that if she has acute worsening, she is to go to closest ED and mention my concern for cervical spine compression. -If MRI is EMG: MND protocol (R > L)  Since their last visit: Labs were significant for low B1. Supplementation with thiamine 100 mg daily was recommended. ***  MRI brain showed no acute process, but right temporoparietal encephalomalacia (post ischemic or traumatic). MRI cervical spine severe canal stenosis with cord compression at C4-5 and severe bilateral foraminal stenosis at C4-5. There was also a cystic lesion seen at C5-6 without significant stenosis.  Due to this, patient was referred to [place]. Patient is scheduled for surgery***  ROS: Pertinent positive and negative systems reviewed in HPI. ***   MEDICATIONS:  Outpatient Encounter Medications as of 03/01/2023  Medication Sig Note   acetaminophen (TYLENOL) 500 MG tablet Take  1,000 mg by mouth daily as needed for headache.    albuterol (PROVENTIL HFA;VENTOLIN HFA) 108 (90 BASE) MCG/ACT inhaler Inhale 1-2 puffs into the lungs every 6 (six) hours as needed for wheezing or shortness of breath.    albuterol (PROVENTIL) (2.5 MG/3ML) 0.083% nebulizer solution Take 2.5 mg by nebulization every 6 (six) hours as needed for wheezing or shortness of breath.    alendronate (FOSAMAX) 70 MG tablet Take 70 mg by  mouth once a week.    Cholecalciferol (VITAMIN D3) 250 MCG (10000 UT) TABS Take 10,000 Units by mouth every morning.    docusate sodium (COLACE) 100 MG capsule Take 1 capsule (100 mg total) by mouth 2 (two) times daily. (Patient taking differently: Take 100 mg by mouth.)    Fluticasone-Umeclidin-Vilant (TRELEGY ELLIPTA) 100-62.5-25 MCG/ACT AEPB Inhale 1 puff into the lungs daily.    Magnesium 250 MG TABS Take 250 mg by mouth every morning.    Multiple Vitamin (MULTIVITAMIN WITH MINERALS) TABS tablet Take 1 tablet by mouth every morning.    naproxen sodium (ALEVE) 220 MG tablet Take 220 mg by mouth daily as needed (headache). (Patient not taking: Reported on 01/27/2023)    oxyCODONE-acetaminophen (PERCOCET/ROXICET) 5-325 MG tablet Take 1 tablet by mouth 4 (four) times daily as needed (pain). 10/17/2021: #120 filled 10/11/2021 [facility] per PMP AWARE (bottle stored in pharmacy)   promethazine (PHENERGAN) 25 MG tablet Take 25 mg by mouth every 6 (six) hours as needed.    No facility-administered encounter medications on file as of 03/01/2023.    PAST MEDICAL HISTORY: Past Medical History:  Diagnosis Date   Alcohol abuse 10/26/2011   Brain bleed ([place]) 2012   Cervical cancer (HCC)    COPD (chronic obstructive pulmonary disease) (HCC)    Displaced fracture of lateral malleolus of right fibula, initial encounter for closed fracture 02/13/2021   Hypertension    Lung infection    Tobacco abuse     PAST SURGICAL HISTORY: Past Surgical History:  Procedure Laterality Date   BIOPSY  02/04/2020   Procedure: BIOPSY;  Surgeon: Danie Binder, MD;  Location: [facility];  Service: Endoscopy;;   BRONCHOSCOPY     COLONOSCOPY WITH PROPOFOL N/A 02/04/2020   Procedure: COLONOSCOPY WITH PROPOFOL;  Surgeon: Danie Binder, MD;  Location: [facility];  Service: Endoscopy;  Laterality: N/A;  8:45am   INTRAMEDULLARY (IM) NAIL INTERTROCHANTERIC Right 10/17/2021   Procedure: INTRAMEDULLARY (IM) NAIL  INTERTROCHANTRIC;  Surgeon: [name], MD;  Location: [facility];  Service: Orthopedics;  Laterality: Right;   KNEE ARTHROSCOPY WITH MENISCAL REPAIR Right 08/25/2020   Procedure: KNEE ARTHROSCOPY WITH  MEDIAL MENISCAL REPAIR;  Surgeon: Carole Civil, MD;  Location: [facility];  Service: Orthopedics;  Laterality: Right;   MULTIPLE TOOTH EXTRACTIONS     ORIF ANKLE FRACTURE Right 02/14/2021   Procedure: OPEN REDUCTION INTERNAL FIXATION (ORIF) ANKLE FRACTURE;  Surgeon: [name], MD;  Location: [place];  Service: Orthopedics;  Laterality: Right;   ORIF FEMUR FRACTURE Right 02/14/2021   Procedure: OPEN REDUCTION INTERNAL FIXATION (ORIF) DISTAL FEMUR FRACTURE;  Surgeon: [name], MD;  Location: [place];  Service: Orthopedics;  Laterality: Right;   POLYPECTOMY  02/04/2020   Procedure: POLYPECTOMY;  Surgeon: Danie Binder, MD;  [facility];  Service: Endoscopy;;   rod in arm Left     ALLERGIES: Allergies  Allergen Reactions   Amoxicillin Nausea Only   Codeine Itching    FAMILY HISTORY: Family History  Problem Relation Age of Onset   Throat cancer Mother    Lung cancer Father    Throat cancer Brother    Lung cancer Brother    Colon cancer Neg Hx    Breast cancer Neg Hx     SOCIAL HISTORY: Social History   Tobacco Use   Smoking status: Every Day    Packs/day: 0.25    Years: 35.00    Additional pack years: 0.00    Total pack years: 8.75    Types: Cigarettes   Smokeless tobacco: Never   Tobacco comments:    I pack a day  Vaping Use   Vaping Use: Never used  Substance Use Topics   Alcohol use: Not Currently    Comment: rarely   Drug use: Yes    Types: Marijuana    Comment: occ   Social History   Social History Narrative   Are you right handed or left handed? Right   Are you currently employed ?    What is your current occupation?disability   Do you live at home alone?   Who lives with you? husband   What type of home do you live in: 1 story or 2  story? one    Caffeine 2 cups of coffee, 1- 2 sodas    Objective:  Vital Signs:  LMP 10/08/2019 Comment: Still spotting since November 10  General:*** General appearance: Awake and alert. No distress. Cooperative with exam.  Skin: No obvious rash or jaundice. HEENT: Atraumatic. Anicteric. Lungs: Non-labored breathing on room air  Heart: Regular Abdomen: Soft, non tender. Extremities: No edema. No obvious deformity.  Musculoskeletal: No obvious joint swelling.  Neurological: Mental Status: Alert. Speech fluent. No pseudobulbar affect Cranial Nerves: CNII: No RAPD. Visual fields intact. CNIII, IV, VI: PERRL. No nystagmus. EOMI. CN V: Facial sensation intact bilaterally to fine touch. [place] clench strong. Jaw jerk***. CN VII: Facial muscles symmetric and strong. No ptosis at rest or after sustained upgaze***. CN VIII: Hears finger rub well bilaterally. CN IX: No hypophonia. CN X: Palate elevates symmetrically. CN XI: Full strength shoulder shrug bilaterally. CN XII: Tongue protrusion full and midline. No atrophy or fasciculations. No significant dysarthria*** Motor: Tone is ***. *** fasciculations in *** extremities. *** atrophy. No grip or percussive myotonia.  Individual muscle group testing (MRC grade out of 5):  Movement     Neck flexion ***    Neck extension ***     Right Left   Shoulder abduction *** ***   Shoulder adduction *** ***   Shoulder ext rotation *** ***   Shoulder int rotation *** ***   Elbow flexion *** ***   Elbow extension *** ***   Wrist extension *** ***   Wrist flexion *** ***   Finger abduction - FDI *** ***   Finger abduction - ADM *** ***   Finger extension *** ***   Finger distal flexion - 2/3 *** ***   Finger distal flexion - 4/5 *** ***   Thumb flexion - FPL *** ***   Thumb abduction - APB *** ***    Hip flexion *** ***   Hip extension *** ***   Hip adduction *** ***   Hip abduction *** ***   Knee extension *** ***   Knee  flexion *** ***   Dorsiflexion *** ***   Plantarflexion *** ***   Inversion *** ***   Eversion *** ***   Great toe extension *** ***   Great toe flexion *** ***  Reflexes:  Right Left  Bicep *** ***  Tricep *** ***  BrRad *** ***  Knee *** ***  Ankle *** ***   Pathological Reflexes: Babinski: *** response bilaterally*** Hoffman: *** Troemner: *** Pectoral: *** Palmomental: *** Facial: *** Midline tap: *** Sensation: Pinprick: *** Vibration: *** Temperature: *** Proprioception: *** Coordination: Intact finger-to- nose-finger and heel-to-shin bilaterally. Romberg negative.*** Gait: Able to rise from chair with arms crossed unassisted. Normal, narrow-based gait. Able to tandem walk. Able to walk on toes and heels.***   Lab and Test Review: New results: 01/27/23: Copper wnl Vit E wnl Folate wnl B12: 431 B1: < 7  MRI cervical spine w/wo contrast (02/04/23): FINDINGS: Alignment: Chronic grade 1-2 anterolisthesis of C4 on C5 and grade 1 anterolisthesis of C5 on C6, and C6 on C7. Slightly exaggerated cervical lordosis.   Vertebrae: Chronic type 2 dens fracture with similar posterior angulation. No residual marrow edema at the fracture site. No acute fracture. No evidence of discitis. No marrow replacing bone lesion. Mild discogenic endplate marrow changes at C4-5.   Cord: Cord compression at the C4-5 level. T2/STIR hyperintense signal within the cord at this location likely reflecting chronic myelomalacia given the diminutive size of the cord at this level. Cord edema is not excluded, particularly along the inferior aspect of the area of signal abnormality (series 6, image 8).   There is a well circumscribed cyst with along the posterior epidural space on the right centered at the C5-6 level measuring 1.3 x 0.9 x 0.6 cm (series 11, image 7; series 8, image 22). Cyst walls are low in signal on T1 and T2 with smooth peripheral enhancement. It is unclear if this  represents a synovial cyst arising from the right facet joint at C5-6 or C4-5 or represents a ganglion cyst arising from the ligamentum flavum. No additional epidural space abnormalities. No epidural fluid collection.   Posterior Fossa, vertebral arteries, paraspinal tissues: Negative.   Disc levels:   C2-C3: No disc protrusion. Bilateral facet joint arthropathy. No significant foraminal stenosis. No canal stenosis.   C3-C4: Small disc osteophyte complex with bilateral facet and uncovertebral spurring. Mild bilateral foraminal stenosis. No canal stenosis.   C4-C5: Disc osteophyte complex with bilateral facet and uncovertebral arthropathy. Severe canal stenosis with cord compression. Severe bilateral foraminal stenosis.   C5-C6: Disc osteophyte complex with bilateral facet arthropathy and uncovertebral spurring. There is mass effect from the cystic lesion in the posterior epidural space on the right. Findings contribute to mild stenosis of the canal on the right. Moderate bilateral foraminal stenosis.   C6-C7: Mild disc osteophyte complex with facet arthropathy. No foraminal or canal stenosis.   C7-T1: No significant disc protrusion, foraminal stenosis, or canal stenosis.   IMPRESSION: 1. Severe canal stenosis with cord compression at the C4-5 level. High signal within the cord at this location likely reflecting chronic myelomalacia given the diminutive size of the cord at this level. Cord edema is not excluded. 2. Well-circumscribed cystic lesion in the posterior epidural space on the right centered at the C5-6 level measuring 1.3 x 0.9 x 0.6 cm. It is unclear if this represents a facet synovial cyst versus a ganglion cyst arising from the ligamentum flavum. Findings contribute to mild stenosis of the canal on the right at C5-6. 3. Severe bilateral foraminal stenosis at C4-5. Moderate bilateral foraminal stenosis at C5-6. 4. Chronic type 2 dens fracture with similar  posterior angulation. No residual marrow edema at the fracture site. No acute fractures.   These  results will be called to the ordering clinician or representative by the Radiologist Assistant, and communication documented in the PACS or [employer].  MRI brain w/wo contrast (02/04/23): FINDINGS: Brain: There is no evidence of an acute infarct, intracranial hemorrhage, mass, midline shift, or extra-axial fluid collection. Small T2 hyperintensities in the cerebral white matter bilaterally are nonspecific but compatible with mild chronic small vessel ischemic disease. Mild cortical encephalomalacia laterally at the right temporoparietal junction is unchanged. There is very mild generalized cerebral atrophy. No abnormal enhancement is identified.   Vascular: Major intracranial vascular flow voids are preserved.   Skull and upper cervical spine: No suspicious marrow lesion. Cervical spine reported separately.   Sinuses/Orbits: Remote left orbital fracture. Minimal mucosal thickening in the paranasal sinuses. Clear mastoid air cells.   Other: None.   IMPRESSION: 1. No acute intracranial abnormality. 2. Mild chronic small vessel ischemic disease. 3. Right temporoparietal encephalomalacia which could be post ischemic or traumatic.  Previously reviewed results: External labs: CMP (11/04/22): unremarkable CBC (11/04/22): unremarkable HbA1c (11/04/22): 5.5 ESR (08/05/22): 5 ANA (08/05/22): negative RF (08/05/22): < 7 TSH (08/05/22): 0.628 Vit D (08/05/22): 64.75   Imaging: CT head and cervical spine wo contrast (02/12/22): FINDINGS: CT HEAD FINDINGS   Brain: No evidence of acute infarction, hemorrhage, hydrocephalus, extra-axial collection, visible mass lesion or mass effect.   Vascular: Atherosclerotic calcification of the carotid siphons. No hyperdense vessel.   Skull: Chronic right supraorbital scarring. No acute soft tissue swelling, gas or laceration. No large scalp hematoma.  No calvarial fracture or acute osseous injury within the included levels of imaging.   Sinuses/Orbits: Chronic rightward anterior nasal septal deviation and in contacting left-sided nasal septal spur. Paranasal sinuses and mastoid air cells are predominantly clear. Middle ear cavities are clear. Included orbital structures are unremarkable.   Other: None   CT CERVICAL SPINE FINDINGS   Alignment: Slight exaggeration of the upper cervical lordosis. Unchanged likely degenerative stepwise anterolisthesis C4-C7. Grossly unchanged alignment of a nonunited type 2 dens fracture and the asymmetric positioning of the lateral masses C1-C2. No acute traumatic listhesis is seen.   Skull base and vertebrae: Redemonstration of the chronic, well corticated type 2 dens fracture with posterior translation and angulation. No acute cervical spine fracture is seen. No visible skull base fractures or other acute osseous abnormalities. No suspicious osseous lesions. Cervical spondylitic changes as below. Additional arthrosis about the atlantodental interval and basion dens intervals, unchanged from prior.   Soft tissues and spinal canal: No pre or paravertebral fluid or swelling. No visible canal hematoma.   Disc levels: Multilevel intervertebral disc height loss with spondylitic endplate changes. At most mild resulting canal stenosis C4-5, C5-6. Multilevel uncinate spurring and facet hypertrophic changes are present as well maximal C4-C7 where there is mild-to-moderate foraminal narrowing bilaterally.   Upper chest: No acute abnormality in the upper chest or imaged lung apices. Stable emphysematous changes.   Other: No concerning thyroid nodule or mass.   IMPRESSION: 1. No acute intracranial abnormality. 2. Grossly unchanged alignment of the chronic, well corticated type 2 dens fracture with posterior translation and angulation. 3. No acute cervical spine fracture or traumatic listhesis. 4.  Multilevel degenerative changes of the cervical spine as described above.  ASSESSMENT: This is Beth [name], a 62 y.o. female with:  ***  Plan: ***  Return to clinic in ***  Total time spent reviewing records, interview, history/exam, documentation, and coordination of care on day of encounter:  *** min  Kai Levins, MD

## 2023-02-22 NOTE — Progress Notes (Signed)
+  BV on vaginal swab will rx flagyl, no sex or alcohol while taking   

## 2023-02-22 NOTE — Telephone Encounter (Signed)
-----   Message from Estill Dooms, NP sent at 02/22/2023  1:30 PM EDT ----- Let her know +BV on vaginal swab and rx sent to her drug store, no sex or alcohol while taking. THX

## 2023-02-22 NOTE — Telephone Encounter (Signed)
Pt aware BV on vaginal swab. Med sent to pharmacy. No sex or alcohol while taking med. Pt voiced understanding. [place]

## 2023-02-24 ENCOUNTER — Other Ambulatory Visit: Payer: Self-pay | Admitting: Adult Health

## 2023-02-24 ENCOUNTER — Ambulatory Visit (HOSPITAL_COMMUNITY)
Admission: RE | Admit: 2023-02-24 | Discharge: 2023-02-24 | Disposition: A | Discharge status: Home / Self Care | Payer: Medicaid Other | Source: Ambulatory Visit | Attending: Adult Health | Admitting: Adult Health

## 2023-02-24 DIAGNOSIS — N898 Other specified noninflammatory disorders of vagina: Secondary | ICD-10-CM

## 2023-02-24 DIAGNOSIS — R102 Pelvic and perineal pain: Secondary | ICD-10-CM | POA: Diagnosis present

## 2023-02-27 ENCOUNTER — Telehealth: Payer: Self-pay | Admitting: Adult Health

## 2023-02-27 NOTE — Telephone Encounter (Signed)
Pt aware of [place] and need for endometrial biopsy, appt made for 03/06/23 at 2:10 pm with Dr Elonda Husky

## 2023-03-01 ENCOUNTER — Ambulatory Visit: Payer: Medicaid Other | Admitting: Neurology

## 2023-03-06 ENCOUNTER — Ambulatory Visit (INDEPENDENT_AMBULATORY_CARE_PROVIDER_SITE_OTHER): Payer: Medicaid Other | Admitting: Obstetrics & Gynecology

## 2023-03-06 ENCOUNTER — Other Ambulatory Visit (HOSPITAL_COMMUNITY)
Admission: RE | Admit: 2023-03-06 | Discharge: 2023-03-06 | Disposition: A | Discharge status: Home / Self Care | Payer: Medicaid Other | Source: Ambulatory Visit | Attending: Obstetrics & Gynecology | Admitting: Obstetrics & Gynecology

## 2023-03-06 ENCOUNTER — Encounter: Payer: Self-pay | Admitting: Obstetrics & Gynecology

## 2023-03-06 VITALS — BP 125/78 | HR 78 | Ht 62.0 in | Wt 76.0 lb

## 2023-03-06 DIAGNOSIS — N711 Chronic inflammatory disease of uterus: Secondary | ICD-10-CM

## 2023-03-06 DIAGNOSIS — N882 Stricture and stenosis of cervix uteri: Secondary | ICD-10-CM

## 2023-03-06 MED ORDER — METRONIDAZOLE 500 MG PO TABS: 500.0000 mg | ORAL_TABLET | Freq: Two times a day (BID) | ORAL | 0 refills | Status: DC

## 2023-03-06 MED ORDER — CIPROFLOXACIN HCL 500 MG PO TABS
500.0000 mg | ORAL_TABLET | Freq: Two times a day (BID) | ORAL | 0 refills | Status: DC
Start: 2023-03-06 — End: 2023-03-24

## 2023-03-06 NOTE — Addendum Note (Signed)
Addended by: Moss Mc on: 03/06/2023 03:40 PM   Modules accepted: Orders

## 2023-03-06 NOTE — Pre-Procedure Instructions (Signed)
Surgical Instructions    Your procedure is scheduled on Wednesday, March 15, 2023.  Report to [facility] Main Entrance "A" at 12:30 PM., then check in with the Admitting office.  Call this number if you have problems the morning of surgery:  [phone]   If you have any questions prior to your surgery date call [phone]: Open Monday-Friday 8am-4pm If you experience any cold or flu symptoms such as cough, fever, chills, shortness of breath, etc. between now and your scheduled surgery, please notify us at the above number     Remember:  Do not eat or drink after midnight the night before your surgery     Take these medicines the morning of surgery with A SIP OF WATER:   Fluticasone-Umeclidin-Vilant (TRELEGY ELLIPTA)    IF NEEDED:  acetaminophen (TYLENOL)  OR  oxyCODONE-acetaminophen (PERCOCET/ROXICET)   promethazine (PHENERGAN)   albuterol (PROVENTIL) (2.5 MG/3ML) 0.083% nebulizer   albuterol (PROVENTIL HFA;VENTOLIN HFA) inhaler-please bring with you to hospital day of surgery   As of today, STOP taking any Aspirin (unless otherwise instructed by your surgeon) Aleve, Naproxen, Ibuprofen, Motrin, Advil, Goody's, BC's, all herbal medications, fish oil, and all vitamins.           Do not wear jewelry or makeup. Do not wear lotions, powders, perfumes or deodorant. Do not shave 48 hours prior to surgery.   Do not bring valuables to the hospital. Do not wear nail polish, gel polish, artificial nails, or any other type of covering on natural nails (fingers and toes) If you have artificial nails or gel coating that need to be removed by a nail salon, please have this removed prior to surgery. Artificial nails or gel coating may interfere with anesthesia's ability to adequately monitor your vital signs.  [facility] is not responsible for any belongings or valuables.    Do NOT Smoke (Tobacco/Vaping)  24 hours prior to your procedure  If you use a CPAP at night, you may bring your  mask for your overnight stay.   Contacts, glasses, hearing aids, dentures or partials may not be worn into surgery, please bring cases for these belongings   For patients admitted to the hospital, discharge time will be determined by your treatment team.   Patients discharged the day of surgery will not be allowed to drive home, and someone needs to stay with them for 24 hours.   SURGICAL WAITING ROOM VISITATION Patients having surgery or a procedure may have no more than 2 support people in the waiting area - these visitors may rotate.   Children under the age of 68 must have an adult with them who is not the patient. If the patient needs to stay at the hospital during part of their recovery, the visitor guidelines for inpatient rooms apply. Pre-op nurse will coordinate an appropriate time for 1 support person to accompany patient in pre-op.  This support person may not rotate.   Please refer to [email or web address] for the visitor guidelines for Inpatients (after your surgery is over and you are in a regular room).    Special instructions:    Oral Hygiene is also important to reduce your risk of infection.  Remember - BRUSH YOUR TEETH THE MORNING OF SURGERY WITH YOUR REGULAR TOOTHPASTE   [place]- Preparing For Surgery  Before surgery, you can play an important role. Because skin is not sterile, your skin needs to be as free of germs as possible. You can reduce the number of germs on your  skin by washing with CHG (chlorahexidine gluconate) Soap before surgery.  CHG is an antiseptic cleaner which kills germs and bonds with the skin to continue killing germs even after washing.     Please do not use if you have an allergy to CHG or antibacterial soaps. If your skin becomes reddened/irritated stop using the CHG.  Do not shave (including legs and underarms) for at least 48 hours prior to first CHG shower. It is OK to shave your  face.  Please follow separate instructions given to you carefully.     Day of Surgery:  Take a shower with CHG soap. Wear Clean/Comfortable clothing the morning of surgery Do not apply any deodorants/lotions.   Remember to brush your teeth WITH YOUR REGULAR TOOTHPASTE.    If you received a COVID test during your pre-op visit, it is requested that you wear a mask when out in public, stay away from anyone that may not be feeling well, and notify your surgeon if you develop symptoms. If you have been in contact with anyone that has tested positive in the last 10 days, please notify your surgeon.    Please read over the following fact sheets that you were given.

## 2023-03-06 NOTE — Progress Notes (Signed)
Follow up appointment for results: sonogram  Chief Complaint  Patient presents with   Endometrial biopsy    Blood pressure 125/78, pulse 78, height 5\' 2"  (1.575 m), weight 76 lb (34.5 kg), last menstrual period 10/08/2019.  CLINICAL DATA:  pelvic pain   EXAM: TRANSABDOMINAL ULTRASOUND OF PELVIS   TECHNIQUE: Transabdominalultrasound examination of the pelvis was performed including evaluation of the uterus, ovaries, adnexal regions, and pelvic cul-de-sac.   COMPARISON:  11/02/2020.   FINDINGS: Uterusanteverted, 7 x 5 x 4 cm. Complex fluid and a masslike process in the uterine cavity of uncertain etiology. Tissue diagnosis is recommended as a precaution to exclude a neoplastic etiology.   Ovaries were not identified. No adnexal masses or fluid collections were seen.   IMPRESSION: 1. Complex fluid and masslike process in the distended uterine cavity. Findings are worrisome for possible neoplastic etiology with a differential of blood clot/debris. Consider tissue diagnosis as a precaution. 2. No adnexal pathology.  The ovaries were not visualized.     Electronically Signed   By: Layla Maw M.D.   On: 02/24/2023 19:23    Reviewed scan 2020 and follow up thereafter, had a chronic pyometra, negative pathology It appears she has reaccumulated the same  I dilated a stenotic cervix and go 10 cc approximately of pyometra   Endometrial Biopsy Procedure Note  Pre-operative Diagnosis: Post menopausal bleeding with chronic pyometra Post-operative Diagnosis: same  Indications: abnormal sonogram findings  Procedure Details   Urine pregnancy test was not done.  The risks (including infection, bleeding, pain, and uterine perforation) and benefits of the procedure were explained to the patient and Written informed consent was obtained.  Antibiotic prophylaxis against endocarditis was not indicated.   The patient was placed in the dorsal lithotomy position.  Bimanual exam  showed the uterus to be in the neutral position.  A Graves' speculum inserted in the vagina, and the cervix prepped with povidone iodine.  Endocervical curettage with a Kevorkian curette was not performed.   A sharp tenaculum was applied to the anterior lip of the cervix for stabilization.  A sterile uterine sound was used to sound the uterus to a depth of 6.5 cm.  A Pipelle endometrial aspirator was used to sample the endometrium.  Sample was sent for pathologic examination.  Condition: Stable  Complications: None  Plan:  The patient was advised to call for any fever or for prolonged or severe pain or bleeding. She was advised to use OTC analgesics as needed for mild to moderate pain. She was advised to avoid vaginal intercourse for 48 hours or until the bleeding has completely stopped.  Attending Physician Documentation: I was present for or performed the following: endometrial biopsy    MEDS ordered this encounter: Meds ordered this encounter  Medications   metroNIDAZOLE (FLAGYL) 500 MG tablet    Sig: Take 1 tablet (500 mg total) by mouth 2 (two) times daily.    Dispense:  20 tablet    Refill:  0   ciprofloxacin (CIPRO) 500 MG tablet    Sig: Take 1 tablet (500 mg total) by mouth 2 (two) times daily.    Dispense:  14 tablet    Refill:  0    Orders for this encounter: No orders of the defined types were placed in this encounter.   Impression + Management Plan   ICD-10-CM   1. Chronic pyometra  N71.1       Follow Up: Return in about 2 weeks (around 03/20/2023) for Follow up,  with Dr Despina Hidden.     All questions were answered.  Past Medical History:  Diagnosis Date   Alcohol abuse 10/26/2011   Brain bleed 2012   Cervical cancer    COPD (chronic obstructive pulmonary disease)    Displaced fracture of lateral malleolus of right fibula, initial encounter for closed fracture 02/13/2021   Hypertension    Lung infection    Tobacco abuse     Past Surgical History:   Procedure Laterality Date   BIOPSY  02/04/2020   Procedure: BIOPSY;  Surgeon: West Bali, MD;  [facility];  Service: Endoscopy;;   BRONCHOSCOPY     COLONOSCOPY WITH PROPOFOL N/A 02/04/2020   Procedure: COLONOSCOPY WITH PROPOFOL;  Surgeon: West Bali, MD;  [facility];  Service: Endoscopy;  Laterality: N/A;  8:45am   INTRAMEDULLARY (IM) NAIL INTERTROCHANTERIC Right 10/17/2021   Procedure: INTRAMEDULLARY (IM) NAIL INTERTROCHANTRIC;  Surgeon: Durene Romans, MD;  Location: [facility];  Service: Orthopedics;  Laterality: Right;   KNEE ARTHROSCOPY WITH MENISCAL REPAIR Right 08/25/2020   Procedure: KNEE ARTHROSCOPY WITH  MEDIAL MENISCAL REPAIR;  Surgeon: Vickki Hearing, MD;  Location: [facility];  Service: Orthopedics;  Laterality: Right;   MULTIPLE TOOTH EXTRACTIONS     ORIF ANKLE FRACTURE Right 02/14/2021   Procedure: OPEN REDUCTION INTERNAL FIXATION (ORIF) ANKLE FRACTURE;  Surgeon: Sheral Apley, MD;  Location: [facility];  Service: Orthopedics;  Laterality: Right;   ORIF FEMUR FRACTURE Right 02/14/2021   Procedure: OPEN REDUCTION INTERNAL FIXATION (ORIF) DISTAL FEMUR FRACTURE;  Surgeon: Sheral Apley, MD;  Location: [facility] OR;  Service: Orthopedics;  Laterality: Right;   POLYPECTOMY  02/04/2020   Procedure: POLYPECTOMY;  Surgeon: West Bali, MD;  [facility];  Service: Endoscopy;;   rod in arm Left     OB History     Gravida  2   Para  0   Term  0   Preterm  0   AB  2   Living  0      SAB  2   IAB  0   Ectopic  0   Multiple  0   Live Births  0           Allergies  Allergen Reactions   Amoxicillin Nausea Only   Codeine Itching    Social History   Socioeconomic History   Marital status: Married    Spouse name: Not on file   Number of children: Not on file   Years of education: Not on file   Highest education level: Not on file  Occupational History   Not on file  Tobacco Use   Smoking status: Every Day     Packs/day: 0.25    Years: 35.00    Additional pack years: 0.00    Total pack years: 8.75    Types: Cigarettes   Smokeless tobacco: Never   Tobacco comments:    I pack a day  Vaping Use   Vaping Use: Never used  Substance and Sexual Activity   Alcohol use: Not Currently    Comment: rarely   Drug use: Yes    Types: Marijuana    Comment: occ   Sexual activity: Not Currently    Birth control/protection: Post-menopausal  Other Topics Concern   Not on file  Social History Narrative   Are you right handed or left handed? Right   Are you currently employed ?    What is your current occupation?disability  Do you live at home alone?   Who lives with you? husband   What type of home do you live in: 1 story or 2 story? one    Caffeine 2 cups of coffee, 1- 2 sodas   Social Determinants of Health   Financial Resource Strain: Not on file  Food Insecurity: Not on file  Transportation Needs: Not on file  Physical Activity: Not on file  Stress: Not on file  Social Connections: Not on file    Family History  Problem Relation Age of Onset   Throat cancer Mother    Lung cancer Father    Throat cancer Brother    Lung cancer Brother    Colon cancer Neg Hx    Breast cancer Neg Hx

## 2023-03-07 ENCOUNTER — Inpatient Hospital Stay (HOSPITAL_COMMUNITY)
Admission: RE | Admit: 2023-03-07 | Discharge: 2023-03-07 | Disposition: A | Discharge status: Home / Self Care | Payer: Medicaid Other | Source: Ambulatory Visit

## 2023-03-08 LAB — SURGICAL PATHOLOGY

## 2023-03-09 ENCOUNTER — Encounter (HOSPITAL_COMMUNITY): Payer: Self-pay

## 2023-03-09 ENCOUNTER — Other Ambulatory Visit: Payer: Self-pay

## 2023-03-09 ENCOUNTER — Encounter (HOSPITAL_COMMUNITY)
Admission: RE | Admit: 2023-03-09 | Discharge: 2023-03-09 | Disposition: A | Discharge status: Home / Self Care | Payer: Medicaid Other | Source: Ambulatory Visit | Attending: Neurological Surgery | Admitting: Neurological Surgery

## 2023-03-09 VITALS — BP 120/77 | HR 77 | Temp 98.0°F | Resp 18 | Ht 62.0 in | Wt 75.2 lb

## 2023-03-09 DIAGNOSIS — G9389 Other specified disorders of brain: Secondary | ICD-10-CM | POA: Insufficient documentation

## 2023-03-09 DIAGNOSIS — N719 Inflammatory disease of uterus, unspecified: Secondary | ICD-10-CM | POA: Diagnosis not present

## 2023-03-09 DIAGNOSIS — I10 Essential (primary) hypertension: Secondary | ICD-10-CM | POA: Insufficient documentation

## 2023-03-09 DIAGNOSIS — F1910 Other psychoactive substance abuse, uncomplicated: Secondary | ICD-10-CM | POA: Insufficient documentation

## 2023-03-09 DIAGNOSIS — Z01818 Encounter for other preprocedural examination: Secondary | ICD-10-CM

## 2023-03-09 DIAGNOSIS — J449 Chronic obstructive pulmonary disease, unspecified: Secondary | ICD-10-CM | POA: Insufficient documentation

## 2023-03-09 DIAGNOSIS — G992 Myelopathy in diseases classified elsewhere: Secondary | ICD-10-CM | POA: Diagnosis not present

## 2023-03-09 DIAGNOSIS — Z01812 Encounter for preprocedural laboratory examination: Secondary | ICD-10-CM | POA: Diagnosis present

## 2023-03-09 DIAGNOSIS — I6782 Cerebral ischemia: Secondary | ICD-10-CM | POA: Insufficient documentation

## 2023-03-09 DIAGNOSIS — M4802 Spinal stenosis, cervical region: Secondary | ICD-10-CM | POA: Insufficient documentation

## 2023-03-09 HISTORY — DX: Unspecified osteoarthritis, unspecified site: M19.90

## 2023-03-09 LAB — COMPREHENSIVE METABOLIC PANEL
ALT: 26 U/L (ref 0–44)
AST: 31 U/L (ref 15–41)
Albumin: 4 g/dL (ref 3.5–5.0)
Alkaline Phosphatase: 54 U/L (ref 38–126)
Anion gap: 11 (ref 5–15)
BUN: 7 mg/dL (ref 6–20)
CO2: 24 mmol/L (ref 22–32)
Calcium: 9.7 mg/dL (ref 8.9–10.3)
Chloride: 101 mmol/L (ref 98–111)
Creatinine, Ser: 0.53 mg/dL (ref 0.44–1.00)
GFR, Estimated: >60 mL/min (ref >60)
Glucose, Bld: 93 mg/dL (ref 70–99)
Potassium: 4 mmol/L (ref 3.5–5.1)
Sodium: 136 mmol/L (ref 135–145)
Total Bilirubin: 0.4 mg/dL (ref 0.3–1.2)
Total Protein: 7.5 g/dL (ref 6.5–8.1)

## 2023-03-09 LAB — CBC
HCT: 44.1 % (ref 36.0–46.0)
Hemoglobin: 14.4 g/dL (ref 12.0–15.0)
MCH: 30.4 pg (ref 26.0–34.0)
MCHC: 32.7 g/dL (ref 30.0–36.0)
MCV: 93 fL (ref 80.0–100.0)
Platelets: 428 10*3/uL — ABNORMAL HIGH (ref 150–400)
RBC: 4.74 MIL/uL (ref 3.87–5.11)
RDW: 14.2 % (ref 11.5–15.5)
WBC: 9.5 10*3/uL (ref 4.0–10.5)
nRBC: 0 % (ref 0.0–0.2)

## 2023-03-09 LAB — SURGICAL PCR SCREEN
MRSA, PCR: NEGATIVE
Staphylococcus aureus: NEGATIVE

## 2023-03-09 LAB — PROTIME-INR
INR: 1.1 (ref 0.8–1.2)
Prothrombin Time: 13.8 seconds (ref 11.4–15.2)

## 2023-03-09 NOTE — Progress Notes (Signed)
[facility].  Cardiologist - denies [facility] Pt reports she got clearance from pulmonologist- records requested  PPM/ICD - denies   Chest x-ray - N/A EKG - 11/23/22 Stress Test - over 10 years ago- normal per patient ECHO - denies Cardiac Cath - denies  Sleep Study - denies   Fasting Blood Sugar - N/A   Last dose of GLP1 agonist-  N/A   Blood Thinner Instructions: N/A Aspirin Instructions:N/A  ERAS Protcol - NPO order    COVID TEST- N/A   Anesthesia review: follow up on requested records. Pt reports she uses her albuterol inhaler 2-3 times a week. Pt states that this is normal for her. Pt denies any shortness of breath, fever, cough or any issues currently. Pt states she has an infection in her ovaries that she is taken ciprofloxacin and metronidazole for. Pt states she has already completed one course of antibiotics for this a couple of weeks ago, but was started on antibiotics again yesterday. Pt states that she will notify Dr. Yetta Barre' office of this when she gets home. I spoke with Erie Noe from Dr. Yetta Barre' office to notify of patient currently taking antibiotic.   Patient denies shortness of breath, fever, cough and chest pain at PAT appointment   All instructions explained to the patient, with a verbal understanding of the material. Patient agrees to go over the instructions while at home for a better understanding. Patient also instructed to self quarantine after being tested for COVID-19. The opportunity to ask questions was provided.
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
## 2023-03-13 NOTE — Anesthesia Preprocedure Evaluation (Signed)
Anesthesia Evaluation  Patient identified by MRN, date of birth, ID band Patient awake    Reviewed: Allergy & Precautions, H&P , NPO status , Patient's Chart, lab work & pertinent test results  Airway Mallampati: II  TM Distance: >3 FB Neck ROM: Full    Dental no notable dental hx. (+) Edentulous Upper, Edentulous Lower, Dental Advisory Given   Pulmonary COPD,  COPD inhaler, Current Smoker and Patient abstained from smoking.   Pulmonary exam normal breath sounds clear to auscultation       Cardiovascular hypertension, Pt. on medications  Rhythm:Regular Rate:Normal     Neuro/Psych negative neurological ROS  negative psych ROS   GI/Hepatic negative GI ROS, Neg liver ROS,,,  Endo/Other  negative endocrine ROS    Renal/GU negative Renal ROS  negative genitourinary   Musculoskeletal  (+) Arthritis , Osteoarthritis,    Abdominal   Peds  Hematology negative hematology ROS (+)   Anesthesia Other Findings   Reproductive/Obstetrics negative OB ROS                             Anesthesia Physical Anesthesia Plan  ASA: 3  Anesthesia Plan: General   Post-op Pain Management: Tylenol PO (pre-op)* and Gabapentin PO (pre-op)*   Induction: Intravenous  PONV Risk Score and Plan: 3 and Ondansetron, Dexamethasone and Treatment may vary due to age or medical condition  Airway Management Planned: Oral ETT and Video Laryngoscope Planned  Additional Equipment:   Intra-op Plan:   Post-operative Plan: Extubation in OR  Informed Consent: I have reviewed the patients History and Physical, chart, labs and discussed the procedure including the risks, benefits and alternatives for the proposed anesthesia with the patient or authorized representative who has indicated his/her understanding and acceptance.     Dental advisory given  Plan Discussed with: CRNA  Anesthesia Plan Comments: (PAT note written  by Shonna Chock, PA-C.  )       Anesthesia Quick Evaluation

## 2023-03-14 NOTE — Progress Notes (Signed)
Patient was called and informed that the surgery time for tomorrow was changed to 13:02 o'clock. Patient was instructed to be at the hospital at 11:00 o'clock. Patient verbalized understanding.

## 2023-03-15 ENCOUNTER — Ambulatory Visit (HOSPITAL_BASED_OUTPATIENT_CLINIC_OR_DEPARTMENT_OTHER): Payer: Medicaid Other | Admitting: Certified Registered"

## 2023-03-15 ENCOUNTER — Ambulatory Visit (HOSPITAL_COMMUNITY)
Admission: RE | Disposition: A | Discharge status: Home / Self Care | Payer: Self-pay | Source: Home / Self Care | Attending: Neurological Surgery

## 2023-03-15 ENCOUNTER — Ambulatory Visit (HOSPITAL_COMMUNITY): Payer: Medicaid Other

## 2023-03-15 ENCOUNTER — Observation Stay (HOSPITAL_COMMUNITY)
Admission: RE | Admit: 2023-03-15 | Discharge: 2023-03-16 | Disposition: A | Discharge status: Home / Self Care | Payer: Medicaid Other | Attending: Neurological Surgery | Admitting: Neurological Surgery

## 2023-03-15 ENCOUNTER — Ambulatory Visit (HOSPITAL_COMMUNITY): Payer: Medicaid Other | Admitting: Emergency Medicine

## 2023-03-15 ENCOUNTER — Other Ambulatory Visit: Payer: Self-pay

## 2023-03-15 ENCOUNTER — Encounter (HOSPITAL_COMMUNITY): Payer: Self-pay | Admitting: Neurological Surgery

## 2023-03-15 DIAGNOSIS — M4712 Other spondylosis with myelopathy, cervical region: Secondary | ICD-10-CM | POA: Diagnosis not present

## 2023-03-15 DIAGNOSIS — Z85828 Personal history of other malignant neoplasm of skin: Secondary | ICD-10-CM | POA: Diagnosis not present

## 2023-03-15 DIAGNOSIS — I10 Essential (primary) hypertension: Secondary | ICD-10-CM

## 2023-03-15 DIAGNOSIS — Z981 Arthrodesis status: Secondary | ICD-10-CM

## 2023-03-15 DIAGNOSIS — Z7952 Long term (current) use of systemic steroids: Secondary | ICD-10-CM | POA: Diagnosis not present

## 2023-03-15 DIAGNOSIS — M4802 Spinal stenosis, cervical region: Secondary | ICD-10-CM

## 2023-03-15 DIAGNOSIS — J449 Chronic obstructive pulmonary disease, unspecified: Secondary | ICD-10-CM | POA: Insufficient documentation

## 2023-03-15 DIAGNOSIS — F17210 Nicotine dependence, cigarettes, uncomplicated: Secondary | ICD-10-CM | POA: Insufficient documentation

## 2023-03-15 DIAGNOSIS — Z79899 Other long term (current) drug therapy: Secondary | ICD-10-CM | POA: Diagnosis not present

## 2023-03-15 HISTORY — PX: ANTERIOR CERVICAL DECOMP/DISCECTOMY FUSION: SHX1161

## 2023-03-15 SURGERY — ANTERIOR CERVICAL DECOMPRESSION/DISCECTOMY FUSION 2 LEVELS
Anesthesia: General

## 2023-03-15 MED ORDER — PHENYLEPHRINE 80 MCG/ML (10ML) SYRINGE FOR IV PUSH (FOR BLOOD PRESSURE SUPPORT)
PREFILLED_SYRINGE | INTRAVENOUS | Status: AC
  Filled 2023-03-15: qty 10

## 2023-03-15 MED ORDER — THROMBIN (RECOMBINANT) 5000 UNITS EX SOLR
CUTANEOUS | Status: DC | PRN
  Administered 2023-03-15: 10 mL via TOPICAL

## 2023-03-15 MED ORDER — PHENOL 1.4 % MT LIQD: 1.0000 | OROMUCOSAL | Status: DC | PRN

## 2023-03-15 MED ORDER — 0.9 % SODIUM CHLORIDE (POUR BTL) OPTIME
TOPICAL | Status: DC | PRN
  Administered 2023-03-15: 1000 mL

## 2023-03-15 MED ORDER — POTASSIUM CHLORIDE IN NACL 20-0.9 MEQ/L-% IV SOLN
INTRAVENOUS | Status: DC
  Filled 2023-03-15 (×2): qty 1000

## 2023-03-15 MED ORDER — DEXAMETHASONE 4 MG PO TABS
4.0000 mg | ORAL_TABLET | Freq: Four times a day (QID) | ORAL | Status: DC
  Administered 2023-03-15 – 2023-03-16 (×3): 4 mg via ORAL
  Filled 2023-03-15 (×4): qty 1

## 2023-03-15 MED ORDER — EPHEDRINE 5 MG/ML INJ
INTRAVENOUS | Status: AC
  Filled 2023-03-15: qty 5

## 2023-03-15 MED ORDER — UMECLIDINIUM BROMIDE 62.5 MCG/ACT IN AEPB
1.0000 | INHALATION_SPRAY | Freq: Every day | RESPIRATORY_TRACT | Status: DC
  Administered 2023-03-16: 1 via RESPIRATORY_TRACT
  Filled 2023-03-15: qty 7

## 2023-03-15 MED ORDER — CHLORHEXIDINE GLUCONATE CLOTH 2 % EX PADS: 6.0000 | MEDICATED_PAD | Freq: Once | CUTANEOUS | Status: DC

## 2023-03-15 MED ORDER — OXYCODONE-ACETAMINOPHEN 5-325 MG PO TABS
ORAL_TABLET | ORAL | Status: AC
  Filled 2023-03-15: qty 1

## 2023-03-15 MED ORDER — LACTATED RINGERS IV SOLN: INTRAVENOUS | Status: DC

## 2023-03-15 MED ORDER — ONDANSETRON HCL 4 MG/2ML IJ SOLN
INTRAMUSCULAR | Status: AC
  Filled 2023-03-15: qty 2

## 2023-03-15 MED ORDER — SODIUM CHLORIDE 0.9 % IV SOLN: 250.0000 mL | INTRAVENOUS | Status: DC

## 2023-03-15 MED ORDER — DEXAMETHASONE SODIUM PHOSPHATE 10 MG/ML IJ SOLN
INTRAMUSCULAR | Status: DC | PRN
  Administered 2023-03-15: 5 mg via INTRAVENOUS

## 2023-03-15 MED ORDER — LIDOCAINE 2% (20 MG/ML) 5 ML SYRINGE
INTRAMUSCULAR | Status: AC
  Filled 2023-03-15: qty 5

## 2023-03-15 MED ORDER — FENTANYL CITRATE (PF) 250 MCG/5ML IJ SOLN
INTRAMUSCULAR | Status: AC
  Filled 2023-03-15: qty 5

## 2023-03-15 MED ORDER — SUGAMMADEX SODIUM 200 MG/2ML IV SOLN
INTRAVENOUS | Status: DC | PRN
  Administered 2023-03-15: 70 mg via INTRAVENOUS
  Administered 2023-03-15: 40 mg via INTRAVENOUS

## 2023-03-15 MED ORDER — PROPOFOL 10 MG/ML IV BOLUS
INTRAVENOUS | Status: AC
  Filled 2023-03-15: qty 20

## 2023-03-15 MED ORDER — PHENYLEPHRINE HCL-NACL 20-0.9 MG/250ML-% IV SOLN
INTRAVENOUS | Status: DC | PRN
  Administered 2023-03-15: 20 ug/min via INTRAVENOUS

## 2023-03-15 MED ORDER — CHLORHEXIDINE GLUCONATE 0.12 % MT SOLN
15.0000 mL | Freq: Once | OROMUCOSAL | Status: AC
  Administered 2023-03-15: 15 mL via OROMUCOSAL
  Filled 2023-03-15: qty 15

## 2023-03-15 MED ORDER — THROMBIN 5000 UNITS EX SOLR
CUTANEOUS | Status: AC
  Filled 2023-03-15: qty 15000

## 2023-03-15 MED ORDER — VANCOMYCIN HCL IN DEXTROSE 1-5 GM/200ML-% IV SOLN
1000.0000 mg | INTRAVENOUS | Status: AC
  Administered 2023-03-15: 1000 mg via INTRAVENOUS
  Filled 2023-03-15: qty 200

## 2023-03-15 MED ORDER — ADULT MULTIVITAMIN W/MINERALS CH
1.0000 | ORAL_TABLET | Freq: Every morning | ORAL | Status: DC
  Administered 2023-03-16: 1 via ORAL
  Filled 2023-03-15: qty 1

## 2023-03-15 MED ORDER — VANCOMYCIN HCL 750 MG/150ML IV SOLN
750.0000 mg | Freq: Once | INTRAVENOUS | Status: AC
  Administered 2023-03-15: 750 mg via INTRAVENOUS
  Filled 2023-03-15: qty 150

## 2023-03-15 MED ORDER — BUPIVACAINE HCL (PF) 0.25 % IJ SOLN
INTRAMUSCULAR | Status: DC | PRN
  Administered 2023-03-15: 3 mL

## 2023-03-15 MED ORDER — OXYCODONE-ACETAMINOPHEN 5-325 MG PO TABS
1.0000 | ORAL_TABLET | Freq: Four times a day (QID) | ORAL | Status: DC | PRN
  Administered 2023-03-15 – 2023-03-16 (×2): 1 via ORAL
  Filled 2023-03-15: qty 1

## 2023-03-15 MED ORDER — ACETAMINOPHEN 500 MG PO TABS
1000.0000 mg | ORAL_TABLET | ORAL | Status: AC
  Administered 2023-03-15: 1000 mg via ORAL
  Filled 2023-03-15: qty 2

## 2023-03-15 MED ORDER — FLUTICASONE FUROATE-VILANTEROL 100-25 MCG/ACT IN AEPB
1.0000 | INHALATION_SPRAY | Freq: Every day | RESPIRATORY_TRACT | Status: DC
  Administered 2023-03-16: 1 via RESPIRATORY_TRACT
  Filled 2023-03-15: qty 28

## 2023-03-15 MED ORDER — FENTANYL CITRATE (PF) 250 MCG/5ML IJ SOLN
INTRAMUSCULAR | Status: DC | PRN
  Administered 2023-03-15 (×3): 50 ug via INTRAVENOUS

## 2023-03-15 MED ORDER — ALBUTEROL SULFATE (2.5 MG/3ML) 0.083% IN NEBU: 2.5000 mg | INHALATION_SOLUTION | Freq: Four times a day (QID) | RESPIRATORY_TRACT | Status: DC | PRN

## 2023-03-15 MED ORDER — ALBUTEROL SULFATE HFA 108 (90 BASE) MCG/ACT IN AERS: 1.0000 | INHALATION_SPRAY | Freq: Four times a day (QID) | RESPIRATORY_TRACT | Status: DC | PRN

## 2023-03-15 MED ORDER — PROMETHAZINE HCL 25 MG PO TABS: 25.0000 mg | ORAL_TABLET | Freq: Four times a day (QID) | ORAL | Status: DC | PRN

## 2023-03-15 MED ORDER — ACETAMINOPHEN 650 MG RE SUPP: 650.0000 mg | RECTAL | Status: DC | PRN

## 2023-03-15 MED ORDER — SENNA 8.6 MG PO TABS
1.0000 | ORAL_TABLET | Freq: Two times a day (BID) | ORAL | Status: DC
  Administered 2023-03-15 – 2023-03-16 (×2): 8.6 mg via ORAL
  Filled 2023-03-15 (×2): qty 1

## 2023-03-15 MED ORDER — SODIUM CHLORIDE 0.9% FLUSH
3.0000 mL | Freq: Two times a day (BID) | INTRAVENOUS | Status: DC
  Administered 2023-03-15 – 2023-03-16 (×2): 3 mL via INTRAVENOUS

## 2023-03-15 MED ORDER — DEXAMETHASONE SODIUM PHOSPHATE 4 MG/ML IJ SOLN: 4.0000 mg | Freq: Four times a day (QID) | INTRAMUSCULAR | Status: DC

## 2023-03-15 MED ORDER — ONDANSETRON HCL 4 MG/2ML IJ SOLN: 4.0000 mg | Freq: Four times a day (QID) | INTRAMUSCULAR | Status: DC | PRN

## 2023-03-15 MED ORDER — ROCURONIUM BROMIDE 10 MG/ML (PF) SYRINGE
PREFILLED_SYRINGE | INTRAVENOUS | Status: AC
  Filled 2023-03-15: qty 10

## 2023-03-15 MED ORDER — HYDROMORPHONE HCL 1 MG/ML IJ SOLN
INTRAMUSCULAR | Status: AC
  Filled 2023-03-15: qty 1

## 2023-03-15 MED ORDER — MENTHOL 3 MG MT LOZG: 1.0000 | LOZENGE | OROMUCOSAL | Status: DC | PRN

## 2023-03-15 MED ORDER — ROCURONIUM BROMIDE 10 MG/ML (PF) SYRINGE
PREFILLED_SYRINGE | INTRAVENOUS | Status: DC | PRN
  Administered 2023-03-15: 20 mg via INTRAVENOUS
  Administered 2023-03-15: 40 mg via INTRAVENOUS
  Administered 2023-03-15: 20 mg via INTRAVENOUS

## 2023-03-15 MED ORDER — MAGNESIUM OXIDE -MG SUPPLEMENT 400 (240 MG) MG PO TABS
400.0000 mg | ORAL_TABLET | Freq: Every morning | ORAL | Status: DC
  Administered 2023-03-16: 400 mg via ORAL
  Filled 2023-03-15: qty 1

## 2023-03-15 MED ORDER — MIDAZOLAM HCL 2 MG/2ML IJ SOLN
INTRAMUSCULAR | Status: AC
  Filled 2023-03-15: qty 2

## 2023-03-15 MED ORDER — THIAMINE MONONITRATE 100 MG PO TABS
100.0000 mg | ORAL_TABLET | Freq: Every day | ORAL | Status: DC
  Administered 2023-03-15 – 2023-03-16 (×2): 100 mg via ORAL
  Filled 2023-03-15 (×2): qty 1

## 2023-03-15 MED ORDER — THROMBIN 5000 UNITS EX SOLR
OROMUCOSAL | Status: DC | PRN
  Administered 2023-03-15: 5 mL via TOPICAL

## 2023-03-15 MED ORDER — ORAL CARE MOUTH RINSE: 15.0000 mL | Freq: Once | OROMUCOSAL | Status: AC

## 2023-03-15 MED ORDER — HYDROMORPHONE HCL 1 MG/ML IJ SOLN
0.2500 mg | INTRAMUSCULAR | Status: DC | PRN
  Administered 2023-03-15 (×2): 0.5 mg via INTRAVENOUS

## 2023-03-15 MED ORDER — PROPOFOL 10 MG/ML IV BOLUS
INTRAVENOUS | Status: DC | PRN
  Administered 2023-03-15: 100 mg via INTRAVENOUS

## 2023-03-15 MED ORDER — MORPHINE SULFATE (PF) 2 MG/ML IV SOLN
2.0000 mg | INTRAVENOUS | Status: DC | PRN
  Administered 2023-03-15: 2 mg via INTRAVENOUS
  Filled 2023-03-15: qty 1

## 2023-03-15 MED ORDER — GABAPENTIN 300 MG PO CAPS
300.0000 mg | ORAL_CAPSULE | ORAL | Status: AC
  Administered 2023-03-15: 300 mg via ORAL
  Filled 2023-03-15: qty 1

## 2023-03-15 MED ORDER — LIDOCAINE 2% (20 MG/ML) 5 ML SYRINGE
INTRAMUSCULAR | Status: DC | PRN
  Administered 2023-03-15: 40 mg via INTRAVENOUS

## 2023-03-15 MED ORDER — SODIUM CHLORIDE 0.9% FLUSH: 3.0000 mL | INTRAVENOUS | Status: DC | PRN

## 2023-03-15 MED ORDER — ACETAMINOPHEN 325 MG PO TABS: 650.0000 mg | ORAL_TABLET | ORAL | Status: DC | PRN

## 2023-03-15 MED ORDER — VANCOMYCIN HCL 750 MG/150ML IV SOLN
750.0000 mg | Freq: Once | INTRAVENOUS | Status: DC
  Filled 2023-03-15: qty 150

## 2023-03-15 MED ORDER — ONDANSETRON HCL 4 MG PO TABS: 4.0000 mg | ORAL_TABLET | Freq: Four times a day (QID) | ORAL | Status: DC | PRN

## 2023-03-15 MED ORDER — DEXAMETHASONE SODIUM PHOSPHATE 10 MG/ML IJ SOLN
INTRAMUSCULAR | Status: AC
  Filled 2023-03-15: qty 1

## 2023-03-15 MED ORDER — BUPIVACAINE HCL (PF) 0.25 % IJ SOLN
INTRAMUSCULAR | Status: AC
  Filled 2023-03-15: qty 30

## 2023-03-15 MED ORDER — ONDANSETRON HCL 4 MG/2ML IJ SOLN
INTRAMUSCULAR | Status: DC | PRN
  Administered 2023-03-15: 4 mg via INTRAVENOUS

## 2023-03-15 SURGICAL SUPPLY — 54 items
ADH SKN CLS APL DERMABOND .7 (GAUZE/BANDAGES/DRESSINGS) ×1
APL SKNCLS STERI-STRIP NONHPOA (GAUZE/BANDAGES/DRESSINGS) ×1
BAG COUNTER SPONGE SURGICOUNT (BAG) ×1 IMPLANT
BAG SPNG CNTER NS LX DISP (BAG) ×1
BAND INSRT 18 STRL LF DISP RB (MISCELLANEOUS) ×2
BAND RUBBER #18 3X1/16 STRL (MISCELLANEOUS) ×2 IMPLANT
BASKET BONE COLLECTION (BASKET) IMPLANT
BENZOIN TINCTURE PRP APPL 2/3 (GAUZE/BANDAGES/DRESSINGS) ×1 IMPLANT
BUR CARBIDE MATCH 3.0 (BURR) ×1 IMPLANT
CANISTER SUCT 3000ML PPV (MISCELLANEOUS) ×1 IMPLANT
DERMABOND ADVANCED .7 DNX12 (GAUZE/BANDAGES/DRESSINGS) IMPLANT
DRAPE C-ARM 42X72 X-RAY (DRAPES) ×2 IMPLANT
DRAPE LAPAROTOMY 100X72 PEDS (DRAPES) ×1 IMPLANT
DRAPE MICROSCOPE SLANT 54X150 (MISCELLANEOUS) ×1 IMPLANT
DRSG OPSITE POSTOP 3X4 (GAUZE/BANDAGES/DRESSINGS) IMPLANT
DURAPREP 6ML APPLICATOR 50/CS (WOUND CARE) ×1 IMPLANT
ELECT COATED BLADE 2.86 ST (ELECTRODE) ×1 IMPLANT
ELECT REM PT RETURN 9FT ADLT (ELECTROSURGICAL) ×1
ELECTRODE REM PT RTRN 9FT ADLT (ELECTROSURGICAL) ×1 IMPLANT
GAUZE 4X4 16PLY ~~LOC~~+RFID DBL (SPONGE) IMPLANT
GLOVE BIO SURGEON STRL SZ7 (GLOVE) IMPLANT
GLOVE BIO SURGEON STRL SZ8 (GLOVE) ×1 IMPLANT
GLOVE BIOGEL PI IND STRL 7.0 (GLOVE) IMPLANT
GOWN STRL REUS W/ TWL LRG LVL3 (GOWN DISPOSABLE) IMPLANT
GOWN STRL REUS W/ TWL XL LVL3 (GOWN DISPOSABLE) ×1 IMPLANT
GOWN STRL REUS W/TWL 2XL LVL3 (GOWN DISPOSABLE) IMPLANT
GOWN STRL REUS W/TWL LRG LVL3 (GOWN DISPOSABLE)
GOWN STRL REUS W/TWL XL LVL3 (GOWN DISPOSABLE) ×1
HEMOSTAT POWDER KIT SURGIFOAM (HEMOSTASIS) ×1 IMPLANT
KIT BASIN OR (CUSTOM PROCEDURE TRAY) ×1 IMPLANT
KIT TURNOVER KIT B (KITS) ×1 IMPLANT
NDL HYPO 25X1 1.5 SAFETY (NEEDLE) ×1 IMPLANT
NDL SPNL 20GX3.5 QUINCKE YW (NEEDLE) ×1 IMPLANT
NEEDLE HYPO 25X1 1.5 SAFETY (NEEDLE) ×1 IMPLANT
NEEDLE SPNL 20GX3.5 QUINCKE YW (NEEDLE) ×1 IMPLANT
NS IRRIG 1000ML POUR BTL (IV SOLUTION) ×1 IMPLANT
PACK LAMINECTOMY NEURO (CUSTOM PROCEDURE TRAY) ×1 IMPLANT
PAD ARMBOARD 7.5X6 YLW CONV (MISCELLANEOUS) ×3 IMPLANT
PIN DISTRACTION 14MM (PIN) ×2 IMPLANT
PLATE ACP INSIGNIA 38 2L (Plate) IMPLANT
SCREW VA SINGLE LEAD 4X14 (Screw) ×6 IMPLANT
SCREW VA SINGLE LEAD 4X14 ST (Screw) IMPLANT
SOL ELECTROSURG ANTI STICK (MISCELLANEOUS) ×1
SOLUTION ELECTROSURG ANTI STCK (MISCELLANEOUS) ×1 IMPLANT
SPACER ASSEM CERV LORD 6M (Spacer) IMPLANT
SPACER ASSEM CERV LORD 7M (Spacer) IMPLANT
SPONGE INTESTINAL PEANUT (DISPOSABLE) ×1 IMPLANT
SPONGE SURGIFOAM ABS GEL SZ50 (HEMOSTASIS) ×1 IMPLANT
STRIP CLOSURE SKIN 1/2X4 (GAUZE/BANDAGES/DRESSINGS) ×1 IMPLANT
SUT VIC AB 3-0 SH 8-18 (SUTURE) ×2 IMPLANT
SUT VICRYL 4-0 PS2 18IN ABS (SUTURE) IMPLANT
TOWEL GREEN STERILE (TOWEL DISPOSABLE) ×1 IMPLANT
TOWEL GREEN STERILE FF (TOWEL DISPOSABLE) ×1 IMPLANT
WATER STERILE IRR 1000ML POUR (IV SOLUTION) ×1 IMPLANT

## 2023-03-15 NOTE — Transfer of Care (Signed)
Immediate Anesthesia Transfer of Care Note  Patient: Beth [name]  Procedure(s) Performed: Anterior Cervical Decompression/ Discectomy Fusion Cervical Four-Cervical Five - Cervical Five-Cervical Six  Patient Location: PACU  Anesthesia Type:General  Level of Consciousness: awake and alert   Airway & Oxygen Therapy: Patient Spontanous Breathing and Patient connected to face mask oxygen  Post-op Assessment: Report given to RN and Post -op Vital signs reviewed and stable  Post vital signs: Reviewed and stable  Last Vitals:  Vitals Value Taken Time  BP 142/83 03/15/23 1519  Temp    Pulse 85 03/15/23 1522  Resp 12 03/15/23 1522  SpO2 100 % 03/15/23 1522  Vitals shown include unvalidated device data.  Last Pain:  Vitals:   03/15/23 1112  PainSc: 8       Patients Stated Pain Goal: 1 (03/15/23 1112)  Complications: No notable events documented.

## 2023-03-15 NOTE — Anesthesia Postprocedure Evaluation (Signed)
Anesthesia Post Note  Patient: Beth [name]  Procedure(s) Performed: Anterior Cervical Decompression/ Discectomy Fusion Cervical Four-Cervical Five - Cervical Five-Cervical Six     Patient location during evaluation: PACU Anesthesia Type: General Level of consciousness: awake and alert Pain management: pain level controlled Vital Signs Assessment: post-procedure vital signs reviewed and stable Respiratory status: spontaneous breathing, nonlabored ventilation, respiratory function stable and patient connected to nasal cannula oxygen Cardiovascular status: blood pressure returned to baseline and stable Postop Assessment: no apparent nausea or vomiting Anesthetic complications: no  No notable events documented.  Last Vitals:  Vitals:   03/15/23 1630 03/15/23 1645  BP: 114/76 115/87  Pulse: 72 80  Resp: 14 13  Temp:    SpO2: 90% 95%    Last Pain:  Vitals:   03/15/23 1615  PainSc: 0-No pain                 Abby Tucholski S

## 2023-03-15 NOTE — Progress Notes (Signed)
Received from PACU with 2 nurses. Oriented to room.

## 2023-03-15 NOTE — Op Note (Signed)
03/15/2023  3:19 PM  PATIENT:  Beth [name]  62 y.o. 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 Decompressive anterior cervical discectomy C4-5 C5-6, 2. Anterior cervical arthrodesis C4-5 C5-6 utilizing structural allograft bone graft, 3. Anterior cervical plating C4-C6 inclusive utilizing a ATEC plate  SURGEON:  Marikay Alar, MD  ASSISTANTS: Verlin Dike, FNP  ANESTHESIA:   General  EBL: 20 ml  Total I/O In: 900 [I.V.:900] Out: -   BLOOD ADMINISTERED: none  DRAINS: none  SPECIMEN:  none  INDICATION FOR PROCEDURE: This patient presented with difficulty walking and numbness tingling in her hands with hyperreflexia. Imaging showed severe spinal stenosis C4-5 with signal change in the spinal cord and spondylosis with subluxation at C5-6 with a posterior cystic structure likely a synovial cyst. The patient tried conservative measures without relief. Pain was debilitating. Recommended ACDF with plating. Patient understood the risks, benefits, and alternatives and potential outcomes and wished to proceed.  PROCEDURE DETAILS: Patient was brought to the operating room placed under general endotracheal anesthesia. Patient was placed in the supine position on the operating room table. The neck was prepped with Duraprep and draped in a sterile fashion.   Three cc of local anesthesia was injected and a transverse incision was made on the right side of the neck.  Dissection was carried down thru the subcutaneous tissue and the platysma was  elevated, opened, and undermined with Metzenbaum scissors.  Dissection was then carried out thru an avascular plane leaving the sternocleidomastoid carotid artery and jugular vein laterally and the trachea and esophagus medially with the assistance of my nurse practitioner. The ventral aspect of the vertebral column was identified and  a localizing x-ray was taken. The C4-5 level was identified and all in the room agreed with the level. The longus colli muscles were then elevated and the retractor was placed with the assistance of my nurse practitioner to expose C4-5 and C5-6. The annulus was incised and the disc space entered. Discectomy was performed with micro-curettes and pituitary rongeurs. I then used the high-speed drill to drill the endplates down to the level of the posterior longitudinal ligament.  The operating microscope was draped and brought into the field provided additional magnification, illumination and visualization. Discectomy was continued posteriorly thru the disc space. Posterior longitudinal ligament was opened with a nerve hook, and then removed along with disc herniation and osteophytes, decompressing the spinal canal and thecal sac. We then continued to remove osteophytic overgrowth and disc material decompressing the neural foramina and exiting nerve roots bilaterally. The scope was angled up and down to help decompress and undercut the vertebral bodies. Once the decompression was completed we could pass a nerve hook circumferentially to assure adequate decompression in the midline and in the neural foramina.  I could see the cord pulsating through the dura at C4-5.  So by both visualization and palpation we felt we had an adequate decompression of the neural elements. We then measured the height of the intravertebral disc space and selected a 7 mm allograft at C4-5 and 6 mm allograft at C5-6 . It was then gently positioned in the intravertebral disc space(s) and countersunk. I then used a 38 mm ATEC plate and placed 14 mm variable angle screws into the vertebral bodies of each level and locked them into position. The wound was irrigated with bacitracin solution, checked for hemostasis which was established and confirmed. Once meticulous  hemostasis was achieved, we then proceeded with closure with the assistance of my  nurse practitioner. The platysma was closed with interrupted 3-0 undyed Vicryl suture, the subcuticular layer was closed with interrupted 3-0 undyed Vicryl suture. The skin edges were approximated with steristrips.  My nurse practitioner was scrubbed for the entirety of the case and assisted in the exposure, the discectomy, the decompression, the fusion, the placement of the plate, and the closure.  The drapes were removed. A sterile dressing was applied. The patient was then awakened from general anesthesia and transferred to the recovery room in stable condition. At the end of the procedure all sponge, needle and instrument counts were correct.   PLAN OF CARE: Admit for overnight observation  PATIENT DISPOSITION:  PACU - hemodynamically stable.   Delay start of Pharmacological VTE agent (>24hrs) due to surgical blood loss or risk of bleeding:  yes

## 2023-03-15 NOTE — Anesthesia Procedure Notes (Signed)
Procedure Name: Intubation Date/Time: 03/15/2023 1:28 PM  Performed by: Alwyn Ren, CRNAPre-anesthesia Checklist: Patient identified, Emergency Drugs available, Suction available and Patient being monitored Patient Re-evaluated:Patient Re-evaluated prior to induction Oxygen Delivery Method: Circle system utilized Preoxygenation: Pre-oxygenation with 100% oxygen Induction Type: IV induction Ventilation: Mask ventilation without difficulty Laryngoscope Size: Glidescope and 3 Grade View: Grade I Tube type: Oral Number of attempts: 1 Airway Equipment and Method: Oral airway and Rigid stylet Placement Confirmation: ETT inserted through vocal cords under direct vision, positive ETCO2 and breath sounds checked- equal and bilateral Secured at: 20 cm Tube secured with: Tape Dental Injury: Teeth and Oropharynx as per pre-operative assessment  Comments: Head and neck in position of comfort for induction/intubation.

## 2023-03-15 NOTE — H&P (Signed)
Subjective:   Patient is a 62 y.o. female admitted for cervical spondylitic myelopathy. The patient first presented to me with complaints of numbness of the arm(s) and loss of strength of the arm(s). Onset of symptoms was several months ago. The pain is described as aching and occurs intermittently. The pain is rated mild, and is located in the neck and radiates to the LUE. The symptoms have been progressive. Symptoms are exacerbated by extending head backwards, and are relieved by none.  Previous work up includes MRI of cervical spine, results: spinal stenosis. She has difficulty walking, hand numbness and weakness  Past Medical History:  Diagnosis Date   Alcohol abuse 10/26/2011   Arthritis    Brain bleed 2012   Cervical cancer    patient unsure- 30 years ago   COPD (chronic obstructive pulmonary disease)    Displaced fracture of lateral malleolus of right fibula, initial encounter for closed fracture 02/13/2021   Hypertension    Lung infection    Tobacco abuse     Past Surgical History:  Procedure Laterality Date   BIOPSY  02/04/2020   Procedure: BIOPSY;  Surgeon: West Bali, MD;  Location: [facility];  Service: Endoscopy;;   BRONCHOSCOPY     COLONOSCOPY WITH PROPOFOL N/A 02/04/2020   Procedure: COLONOSCOPY WITH PROPOFOL;  Surgeon: West Bali, MD;  Location: [facility];  Service: Endoscopy;  Laterality: N/A;  8:45am   INTRAMEDULLARY (IM) NAIL INTERTROCHANTERIC Right 10/17/2021   Procedure: INTRAMEDULLARY (IM) NAIL INTERTROCHANTRIC;  Surgeon: Durene Romans, MD;  Location: [facility];  Service: Orthopedics;  Laterality: Right;   KNEE ARTHROSCOPY WITH MENISCAL REPAIR Right 08/25/2020   Procedure: KNEE ARTHROSCOPY WITH  MEDIAL MENISCAL REPAIR;  Surgeon: Vickki Hearing, MD;  Location: [facility];  Service: Orthopedics;  Laterality: Right;   MULTIPLE TOOTH EXTRACTIONS     ORIF ANKLE FRACTURE Right 02/14/2021   Procedure: OPEN REDUCTION INTERNAL FIXATION (ORIF) ANKLE FRACTURE;   Surgeon: Sheral Apley, MD;  Location: [facility];  Service: Orthopedics;  Laterality: Right;   ORIF FEMUR FRACTURE Right 02/14/2021   Procedure: OPEN REDUCTION INTERNAL FIXATION (ORIF) DISTAL FEMUR FRACTURE;  Surgeon: Sheral Apley, MD;  Location: [facility];  Service: Orthopedics;  Laterality: Right;   POLYPECTOMY  02/04/2020   Procedure: POLYPECTOMY;  Surgeon: West Bali, MD;  Location: [facility];  Service: Endoscopy;;   rod in arm Left     Allergies  Allergen Reactions   Amoxicillin Nausea Only   Codeine Itching    Social History   Tobacco Use   Smoking status: Every Day    Packs/day: 0.25    Years: 35.00    Additional pack years: 0.00    Total pack years: 8.75    Types: Cigarettes   Smokeless tobacco: Never   Tobacco comments:    I pack a day  Substance Use Topics   Alcohol use: Not Currently    Comment: rarely    Family History  Problem Relation Age of Onset   Throat cancer Mother    Lung cancer Father    Throat cancer Brother    Lung cancer Brother    Colon cancer Neg Hx    Breast cancer Neg Hx    Prior to Admission medications   Medication Sig Start Date End Date Taking? Authorizing Provider  acetaminophen (TYLENOL) 500 MG tablet Take 1,000 mg by mouth daily as needed for headache.   Yes [provider]  albuterol (PROVENTIL HFA;VENTOLIN HFA) 108 (90  BASE) MCG/ACT inhaler Inhale 1-2 puffs into the lungs every 6 (six) hours as needed for wheezing or shortness of breath.   Yes [provider]  albuterol (PROVENTIL) (2.5 MG/3ML) 0.083% nebulizer solution Take 2.5 mg by nebulization every 6 (six) hours as needed for wheezing or shortness of breath.   Yes [provider]  alendronate (FOSAMAX) 70 MG tablet Take 70 mg by mouth once a week.   Yes [provider]  Cholecalciferol (VITAMIN D3) 250 MCG (10000 UT) TABS Take 10,000 Units by mouth every morning.   Yes [provider]  ciprofloxacin (CIPRO) 500 MG tablet Take 1  tablet (500 mg total) by mouth 2 (two) times daily. 03/06/23  Yes Lazaro Arms, MD  Fluticasone-Umeclidin-Vilant (TRELEGY ELLIPTA) 100-62.5-25 MCG/ACT AEPB Inhale 1 puff into the lungs daily.   Yes [provider]  Magnesium 250 MG TABS Take 250 mg by mouth every morning.   Yes [provider]  Multiple Minerals (JOINT HEALTH MINERAL PO) Take 1 tablet by mouth daily.   Yes [provider]  Multiple Vitamin (MULTIVITAMIN WITH MINERALS) TABS tablet Take 1 tablet by mouth every morning.   Yes [provider]  oxyCODONE-acetaminophen (PERCOCET/ROXICET) 5-325 MG tablet Take 1 tablet by mouth 4 (four) times daily as needed (pain). 10/11/21  Yes [provider]  thiamine (VITAMIN B-1) 100 MG tablet Take 100 mg by mouth daily.   Yes [provider]  docusate sodium (COLACE) 100 MG capsule Take 1 capsule (100 mg total) by mouth 2 (two) times daily. Patient taking differently: Take 100 mg by mouth daily as needed for moderate constipation. 10/18/21   Amin, Loura Halt, MD  metroNIDAZOLE (FLAGYL) 500 MG tablet Take 1 tablet (500 mg total) by mouth 2 (two) times daily. Patient not taking: Reported on 03/02/2023 02/22/23   Adline Potter, NP  metroNIDAZOLE (FLAGYL) 500 MG tablet Take 1 tablet (500 mg total) by mouth 2 (two) times daily. 03/06/23   Lazaro Arms, MD  naproxen sodium (ALEVE) 220 MG tablet Take 220 mg by mouth daily as needed (headache). Patient not taking: Reported on 03/06/2023    [provider]  promethazine (PHENERGAN) 25 MG tablet Take 25 mg by mouth every 6 (six) hours as needed for vomiting or nausea. 11/04/22   [provider]     Review of Systems  Positive ROS: neg  All other systems have been reviewed and were otherwise negative with the exception of those mentioned in the HPI and as above.  Objective: Vital signs in last 24 hours: Temp:  [98.4 F (36.9 C)] 98.4 F (36.9 C) (04/17 1110) Resp:  [18] 18  (04/17 1110) BP: (115)/(88) 115/88 (04/17 1110) SpO2:  [100 %] 100 % (04/17 1110) Weight:  [34.1 kg] 34.1 kg (04/17 1110)  General Appearance: Alert, cooperative, no distress, appears stated age Head: Normocephalic, without obvious abnormality, atraumatic Eyes: PERRL, conjunctiva/corneas clear, EOM's intact      Neck: Supple, symmetrical, trachea midline, Back: Symmetric, no curvature, ROM normal, no CVA tenderness Lungs:  respirations unlabored Heart: Regular rate and rhythm Abdomen: Soft, non-tender Extremities: Extremities normal, atraumatic, no cyanosis or edema Pulses: 2+ and symmetric all extremities Skin: Skin color, texture, turgor normal, no rashes or lesions  NEUROLOGIC:  Mental status: Alert and oriented x4, no aphasia, good attention span, fund of knowledge and memory  Motor Exam - grossly normal Sensory Exam - grossly normal Reflexes: 3+ Coordination - some dyscoordination Gait -not tested Balance -not tested today  Cranial Nerves: I: smell Not tested  II: visual acuity  OS: nl    OD: nl  II: visual fields Full to confrontation  II: pupils Equal, round, reactive to light  III,VII: ptosis None  III,IV,VI: extraocular muscles  Full ROM  V: mastication Normal  V: facial light touch sensation  Normal  V,VII: corneal reflex  Present  VII: facial muscle function - upper  Normal  VII: facial muscle function - lower Normal  VIII: hearing Not tested  IX: soft palate elevation  Normal  IX,X: gag reflex Present  XI: trapezius strength  5/5  XI: sternocleidomastoid strength 5/5  XI: neck flexion strength  5/5  XII: tongue strength  Normal    Data Review Lab Results  Component Value Date   WBC 9.5 03/09/2023   HGB 14.4 03/09/2023   HCT 44.1 03/09/2023   MCV 93.0 03/09/2023   PLT 428 (H) 03/09/2023   Lab Results  Component Value Date   NA 136 03/09/2023   K 4.0 03/09/2023   CL 101 03/09/2023   CO2 24 03/09/2023   BUN 7 03/09/2023   CREATININE 0.53  03/09/2023   GLUCOSE 93 03/09/2023   Lab Results  Component Value Date   INR 1.1 03/09/2023    Assessment:   Cervical neck pain with herniated nucleus pulposus/ spondylosis/ stenosis at C4 5 and C5-6 with cord compression and myelopathy. Estimated body mass index is 13.75 kg/m as calculated from the following:   Height as of this encounter:  (1.575 m).   Weight as of this encounter: 34.1 kg.  Patient has failed conservative therapy. Planned surgery : ACDF with plating C4-5 and C5-6  Plan:   I explained the condition and procedure to the patient and answered any questions.  Patient wishes to proceed with procedure as planned. Understands risks/ benefits/ and expected or typical outcomes.  Tia Alert 03/15/2023 12:42 PM

## 2023-03-15 NOTE — Progress Notes (Signed)
Orthopedic Tech Progress Note Patient Details:  Beth [name] February 12, 1961 [id number]  PACU RN called requesting an ASPEN CERVICAL COLLAR, called in order STAT to [facility]    Patient ID: Beth [name], female   DOB: 1961/03/27, 62 y.o.   MRN: [id number]  Beth [name] 03/15/2023, 5:07 PM

## 2023-03-16 ENCOUNTER — Encounter (HOSPITAL_COMMUNITY): Payer: Self-pay | Admitting: Neurological Surgery

## 2023-03-16 DIAGNOSIS — M4712 Other spondylosis with myelopathy, cervical region: Secondary | ICD-10-CM | POA: Diagnosis not present

## 2023-03-16 MED ORDER — OXYCODONE-ACETAMINOPHEN 5-325 MG PO TABS: 1.0000 | ORAL_TABLET | Freq: Four times a day (QID) | ORAL | 0 refills | Status: AC | PRN

## 2023-03-16 MED ORDER — METHOCARBAMOL 750 MG PO TABS: 750.0000 mg | ORAL_TABLET | Freq: Four times a day (QID) | ORAL | 0 refills | Status: AC

## 2023-03-16 MED FILL — Thrombin For Soln 5000 Unit: CUTANEOUS | Qty: 2 | Status: AC

## 2023-03-16 NOTE — TOC Transition Note (Signed)
Transition of Care [facility]) - CM/SW Discharge Note   Patient Details  Name: Beth [name] MRN: [id number] Date of Birth: Apr 06, 1961  Transition of Care [facility]) CM/SW Contact:  Kermit Balo, RN Phone Number: 03/16/2023, 9:57 AM   Clinical Narrative:    Pt is discharging home with self care. No f/u per PT/OT and no DME needs. No needs per TOC.   Final next level of care: Home/Self Care Barriers to Discharge: No Barriers Identified   Patient Goals and CMS Choice      Discharge Placement                         Discharge Plan and Services Additional resources added to the After Visit Summary for                                       Social Determinants of Health (SDOH) Interventions SDOH Screenings   Food Insecurity: No Food Insecurity (03/15/2023)  Housing: Low Risk  (03/15/2023)  Transportation Needs: No Transportation Needs (03/15/2023)  Utilities: Not At Risk (03/15/2023)  Depression (PHQ2-9): Low Risk  (11/06/2019)  Tobacco Use: High Risk (03/16/2023)     Readmission Risk Interventions     No data to display

## 2023-03-16 NOTE — Evaluation (Signed)
Occupational Therapy Evaluation/Discharge Patient Details Name: Beth [name] MRN: [id number] DOB: 03-Jul-1961 Today's Date: 03/16/2023   History of Present Illness Pt is a 62 y/o female admitted for ACDF C4-5, C5-6 in setting of cervical spondylitic myelopathy. PMH: alcohol abuse, arthritis, brain bleed, cervical cancer, HTN, tobacco abuse.   Clinical Impression   PTA, pt lives with spouse, typically ambulatory with Rollator and able to manage ADLs w/ Modified Independence (aside from tub transfer assist). Pt presents now w/ minimal pain and eager for DC home today. Educated re: cervical precautions for ADLs, general body mechanics, brace wear recs and IADL assist. Pt able to manage ADLs/mobility with Rollator without physical assist or LOB.  Provided theraputty for pt to further work on B hand sensation/function. Rec follow up therapy per surgeon's protocol.     Recommendations for follow up therapy are one component of a multi-disciplinary discharge planning process, led by the attending physician.  Recommendations may be updated based on patient status, additional functional criteria and insurance authorization.   Assistance Recommended at Discharge PRN  Patient can return home with the following Assistance with cooking/housework;Assist for transportation    Functional Status Assessment  Patient has had a recent decline in their functional status and demonstrates the ability to make significant improvements in function in a reasonable and predictable amount of time.  Equipment Recommendations  None recommended by OT    Recommendations for Other Services       Precautions / Restrictions Precautions Precautions: Cervical Precaution Booklet Issued: Yes (comment) Required Braces or Orthoses: Cervical Brace Cervical Brace: Hard collar;Other (comment) (off for showering, can remove for bathroom mobility and in bed) Restrictions Weight Bearing Restrictions: No      Mobility Bed  Mobility Overal bed mobility: Modified Independent                  Transfers Overall transfer level: Modified independent Equipment used: Rollator (4 wheels)                      Balance Overall balance assessment: Needs assistance Sitting-balance support: No upper extremity supported, Feet supported Sitting balance-Leahy Scale: Good     Standing balance support: Bilateral upper extremity supported, During functional activity, No upper extremity supported Standing balance-Leahy Scale: Fair                             ADL either performed or assessed with clinical judgement   ADL Overall ADL's : Modified independent                                       General ADL Comments: able to manage underwear, pants, bathroom mobility w/ Rollator and toileting task without physical assistance. Educated re: cervical precautions for ADLs, use of cups/straws for oral care and meals, brace mgmt and wearing schedule, DME use and IADL assist. provided putty for B hand use/sensation     Vision Baseline Vision/History: 1 Wears glasses Ability to See in Adequate Light: 0 Adequate Patient Visual Report: No change from baseline Vision Assessment?: No apparent visual deficits     Perception     Praxis      Pertinent Vitals/Pain Pain Assessment Pain Assessment: 0-10 Pain Score: 5  Pain Location: neck Pain Descriptors / Indicators: Guarding, Grimacing Pain Intervention(s): Monitored during session     Hand Dominance Right  Extremity/Trunk Assessment Upper Extremity Assessment Upper Extremity Assessment: RUE deficits/detail;LUE deficits/detail RUE Deficits / Details: B hand numbness, able to functionally dress self, not dropping items, etc LUE Deficits / Details: reports prior to sx, most of L arm numb but now only hand numb.   Lower Extremity Assessment Lower Extremity Assessment: Overall WFL for tasks assessed   Cervical / Trunk  Assessment Cervical / Trunk Assessment: Neck Surgery   Communication Communication Communication: No difficulties   Cognition Arousal/Alertness: Awake/alert Behavior During Therapy: WFL for tasks assessed/performed Overall Cognitive Status: Within Functional Limits for tasks assessed                                       General Comments       Exercises     Shoulder Instructions      Home Living Family/patient expects to be discharged to:: Private residence Living Arrangements: Spouse/significant other Available Help at Discharge: Family;Available 24 hours/day Type of Home: House Home Access: Ramped entrance     Home Layout: One level     Bathroom Shower/Tub: Tub only   Firefighter: Standard     Home Equipment: Rollator (4 wheels);BSC/3in1;Hand held shower head;Shower seat   Additional Comments: planning to get curtain/rod to go around current tub setup      Prior Functioning/Environment Prior Level of Function : Independent/Modified Independent             Mobility Comments: Rollator for mobility, only recent fall is what caused initial issues leading to this sx ADLs Comments: MOD I with ADLs aside from tub transfer assist, husband does IADLs mostly        OT Problem List: Impaired sensation;Pain      OT Treatment/Interventions:      OT Goals(Current goals can be found in the care plan section) Acute Rehab OT Goals Patient Stated Goal: home today, work on hand sensation OT Goal Formulation: All assessment and education complete, DC therapy  OT Frequency:      Co-evaluation              AM-PAC OT "6 Clicks" Daily Activity     Outcome Measure Help from another person eating meals?: None Help from another person taking care of personal grooming?: None Help from another person toileting, which includes using toliet, bedpan, or urinal?: None Help from another person bathing (including washing, rinsing, drying)?: None Help  from another person to put on and taking off regular upper body clothing?: None Help from another person to put on and taking off regular lower body clothing?: None 6 Click Score: 24   End of Session Equipment Utilized During Treatment: Rollator (4 wheels);Cervical collar Nurse Communication: Mobility status;Other (comment) (request for inhaler)  Activity Tolerance: Patient tolerated treatment well Patient left: in chair;with call bell/phone within reach;with chair alarm set;Other (comment) (chair alarm in-room alarm only; no nurse call cord)  OT Visit Diagnosis: Unsteadiness on feet (R26.81);Other abnormalities of gait and mobility (R26.89)                Time: 9604-5409 OT Time Calculation (min): 34 min Charges:  OT General Charges $OT Visit: 1 Visit OT Evaluation $OT Eval Low Complexity: 1 Low OT Treatments $Self Care/Home Management : 8-22 mins  Bradd Canary, OTR/L Acute Rehab Services Office: [phone]   Lorre Munroe 03/16/2023, 8:51 AM

## 2023-03-16 NOTE — Progress Notes (Signed)
PT Cancellation Note  Patient Details Name: Beth [name] MRN: [id number] DOB: [birth year]   Cancelled Treatment:    Reason Eval/Treat Not Completed: (P) PT screened, no needs identified, will sign off. OT reporting pt is at her baseline functional mobility, using a rollator mod I. No acute PT needs identified, will sign off.  Raymond Gurney, PT, DPT Acute Rehabilitation Services  Office: [phone]    Jewel Baize 03/16/2023, 8:03 AM

## 2023-03-16 NOTE — Progress Notes (Signed)
Discharged to home after IV access removed and discharge instructions provided to patient and spouse by Virtual nurse

## 2023-03-16 NOTE — Discharge Summary (Signed)
Physician Discharge Summary  Patient ID: Beth [name] MRN: [id number] DOB/AGE: 1961/04/02 62 y.o.  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 The patient tolerated the procedure well and was taken to the recovery room and then to the floor in stable condition. The hospital course was routine. There were no complications. The wound remained clean dry and intact. Pt had appropriate neck soreness. No complaints of arm pain or new N/T/W. The patient remained afebrile with stable vital signs, and tolerated a regular diet. The patient continued to increase activities, and pain was well controlled with oral pain medications.   Consults: None  Significant Diagnostic Studies:  Results for orders placed or performed during the hospital encounter of 03/09/23  Surgical pcr screen   Specimen: Nasal Mucosa; Nasal Swab  Result Value Ref Range   MRSA, PCR NEGATIVE NEGATIVE   Staphylococcus aureus NEGATIVE NEGATIVE  Protime-INR  Result Value Ref Range   Prothrombin Time 13.8 11.4 - 15.2 seconds   INR 1.1 0.8 - 1.2  Comprehensive metabolic panel per protocol  Result Value Ref Range   Sodium 136 135 - 145 mmol/L   Potassium 4.0 3.5 - 5.1 mmol/L   Chloride 101 98 - 111 mmol/L   CO2 24 22 - 32 mmol/L   Glucose, Bld 93 70 - 99 mg/dL   BUN 7 6 - 20 mg/dL   Creatinine, Ser 4.09 0.44 - 1.00 mg/dL   Calcium 9.7 8.9 - 81.1 mg/dL   Total Protein 7.5 6.5 - 8.1 g/dL   Albumin 4.0 3.5 - 5.0 g/dL   AST 31 15 - 41 U/L   ALT 26 0 - 44 U/L   Alkaline Phosphatase 54 38 - 126 U/L   Total Bilirubin 0.4 0.3 - 1.2 mg/dL   GFR, Estimated >91 >47 mL/min   Anion gap 11 5 - 15  CBC per protocol  Result Value  Ref Range   WBC 9.5 4.0 - 10.5 K/uL   RBC 4.74 3.87 - 5.11 MIL/uL   Hemoglobin 14.4 12.0 - 15.0 g/dL   HCT 82.9 56.2 - 13.0 %   MCV 93.0 80.0 - 100.0 fL   MCH 30.4 26.0 - 34.0 pg   MCHC 32.7 30.0 - 36.0 g/dL   RDW 86.5 78.4 - 69.6 %   Platelets 428 (H) 150 - 400 K/uL   nRBC 0.0 0.0 - 0.2 %    DG Cervical Spine 1 View  Result Date: 03/15/2023 CLINICAL DATA:  ACDF C4-C5 and C5-C6 EXAM: DG CERVICAL SPINE - 1 VIEW COMPARISON:  02/09/2023 cervical spine radiographs FINDINGS: One fluoroscopic image obtained during the performance of the procedure and provided for interpretation only. Images demonstrate ACDF hardware at C4-C6. Fluoroscopy time: 17 seconds Cumulative air kerma: 1.33 mGy IMPRESSION: Intraoperative fluoroscopy for ACDF C4-C6. Electronically Signed   By: Wiliam Ke M.D.   On: 03/15/2023 15:15   US PELVIS (TRANSABDOMINAL ONLY)  Result Date: 02/24/2023 CLINICAL DATA:  pelvic pain EXAM: TRANSABDOMINAL ULTRASOUND OF PELVIS TECHNIQUE: Transabdominalultrasound examination of the pelvis was performed including evaluation of the uterus, ovaries, adnexal regions, and pelvic cul-de-sac. COMPARISON:  11/02/2020. FINDINGS: Uterusanteverted, 7 x 5 x 4 cm. Complex fluid and a masslike process in the uterine cavity of uncertain etiology. Tissue  diagnosis is recommended as a precaution to exclude a neoplastic etiology. Ovaries were not identified. No adnexal masses or fluid collections were seen. IMPRESSION: 1. Complex fluid and masslike process in the distended uterine cavity. Findings are worrisome for possible neoplastic etiology with a differential of blood clot/debris. Consider tissue diagnosis as a precaution. 2. No adnexal pathology.  The ovaries were not visualized. Electronically Signed   By: Layla Maw M.D.   On: 02/24/2023 19:23    Antibiotics:  Anti-infectives (From admission, onward)    Start     Dose/Rate Route Frequency Ordered Stop   03/16/23 0000  vancomycin (VANCOREADY) IVPB  750 mg/150 mL        750 mg 150 mL/hr over 60 Minutes Intravenous  Once 03/15/23 1946 03/16/23 0037   03/15/23 2200  vancomycin (VANCOREADY) IVPB 750 mg/150 mL  Status:  Discontinued        750 mg 150 mL/hr over 60 Minutes Intravenous  Once 03/15/23 1945 03/15/23 1946   03/15/23 1115  vancomycin (VANCOCIN) IVPB 1000 mg/200 mL premix        1,000 mg 200 mL/hr over 60 Minutes Intravenous On call to O.R. 03/15/23 1100 03/15/23 2000       Discharge Exam: Blood pressure 134/86, pulse 74, temperature 97.7 F (36.5 C), temperature source Oral, resp. rate 16, height 5\' 2"  (1.575 m), weight 34.1 kg, last menstrual period 10/08/2019, SpO2 95 %.  Ambulating and vdoing well incsino cdi   Discharge Medications:   Allergies as of 03/16/2023       Reactions   Amoxicillin Nausea Only   Codeine Itching        Medication List     TAKE these medications    acetaminophen 500 MG tablet Commonly known as: TYLENOL Take 1,000 mg by mouth daily as needed for headache.   albuterol 108 (90 Base) MCG/ACT inhaler Commonly known as: VENTOLIN HFA Inhale 1-2 puffs into the lungs every 6 (six) hours as needed for wheezing or shortness of breath.   albuterol (2.5 MG/3ML) 0.083% nebulizer solution Commonly known as: PROVENTIL Take 2.5 mg by nebulization every 6 (six) hours as needed for wheezing or shortness of breath.   alendronate 70 MG tablet Commonly known as: FOSAMAX Take 70 mg by mouth once a week.   ciprofloxacin 500 MG tablet Commonly known as: Cipro Take 1 tablet (500 mg total) by mouth 2 (two) times daily.   docusate sodium 100 MG capsule Commonly known as: COLACE Take 1 capsule (100 mg total) by mouth 2 (two) times daily. What changed:  when to take this reasons to take this   JOINT HEALTH MINERAL PO Take 1 tablet by mouth daily.   Magnesium 250 MG Tabs Take 250 mg by mouth every morning.   methocarbamol 750 MG tablet Commonly known as: Robaxin-750 Take 1 tablet (750 mg  total) by mouth 4 (four) times daily.   metroNIDAZOLE 500 MG tablet Commonly known as: FLAGYL Take 1 tablet (500 mg total) by mouth 2 (two) times daily.   metroNIDAZOLE 500 MG tablet Commonly known as: FLAGYL Take 1 tablet (500 mg total) by mouth 2 (two) times daily.   multivitamin with minerals Tabs tablet Take 1 tablet by mouth every morning.   naproxen sodium 220 MG tablet Commonly known as: ALEVE Take 220 mg by mouth daily as needed (headache).   oxyCODONE-acetaminophen 5-325 MG tablet Commonly known as: PERCOCET/ROXICET Take 1 tablet by mouth 4 (four) times daily as needed (pain).   promethazine 25 MG  tablet Commonly known as: PHENERGAN Take 25 mg by mouth every 6 (six) hours as needed for vomiting or nausea.   thiamine 100 MG tablet Commonly known as: Vitamin B-1 Take 100 mg by mouth daily.   Trelegy Ellipta 100-62.5-25 MCG/ACT Aepb Generic drug: Fluticasone-Umeclidin-Vilant Inhale 1 puff into the lungs daily.   Vitamin D3 250 MCG (10000 UT) Tabs Take 10,000 Units by mouth every morning.        Disposition: home   Final Dx: acdf C4-5, C5-6  Discharge Instructions      Remove dressing in 72 hours   Complete by: As directed    Call MD for:  difficulty breathing, headache or visual disturbances   Complete by: As directed    Call MD for:  hives   Complete by: As directed    Call MD for:  persistant dizziness or light-headedness   Complete by: As directed    Call MD for:  persistant nausea and vomiting   Complete by: As directed    Call MD for:  redness, tenderness, or signs of infection (pain, swelling, redness, odor or green/yellow discharge around incision site)   Complete by: As directed    Call MD for:  severe uncontrolled pain   Complete by: As directed    Call MD for:  temperature >100.4   Complete by: As directed    Diet - low sodium heart healthy   Complete by: As directed    Driving Restrictions   Complete by: As directed    No driving for  2 weeks, no riding in the car for 1 week   Increase activity slowly   Complete by: As directed    Lifting restrictions   Complete by: As directed    No lifting more than 8 lbs          Signed: Tiana Loft Dani Wallner 03/16/2023, 8:15 AM

## 2023-03-21 ENCOUNTER — Ambulatory Visit: Payer: Medicaid Other | Admitting: Obstetrics & Gynecology

## 2023-03-24 ENCOUNTER — Encounter: Payer: Self-pay | Admitting: Obstetrics & Gynecology

## 2023-03-24 ENCOUNTER — Ambulatory Visit: Payer: Medicaid Other | Admitting: Obstetrics & Gynecology

## 2023-03-24 VITALS — BP 122/81 | HR 92 | Ht 62.0 in | Wt 86.0 lb

## 2023-03-24 DIAGNOSIS — N711 Chronic inflammatory disease of uterus: Secondary | ICD-10-CM | POA: Diagnosis not present

## 2023-03-24 NOTE — Progress Notes (Signed)
Follow up appointment for results: EMBx  Chief Complaint  Patient presents with   Follow-up    Biopsy results    Blood pressure 122/81, pulse 92, height 5\' 2"  (1.575 m), weight 86 lb (39 kg), last menstrual period 10/08/2019.  Inflammatory changes no atypia, hyperplasia or malignancy  MEDS ordered this encounter: No orders of the defined types were placed in this encounter.   Orders for this encounter: No orders of the defined types were placed in this encounter.   Impression + Management Plan   ICD-10-CM   1. 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     All questions were answered.  Past Medical History:  Diagnosis Date   Alcohol abuse 10/26/2011   Arthritis    Brain bleed (HCC) 2012   Cervical cancer [facility])    patient unsure- 30 years ago   COPD (chronic obstructive pulmonary disease) (HCC)    Displaced fracture of lateral malleolus of right fibula, initial encounter for closed fracture 02/13/2021   Hypertension    Lung infection    Tobacco abuse     Past Surgical History:  Procedure Laterality Date   ANTERIOR CERVICAL DECOMP/DISCECTOMY FUSION N/A 03/15/2023   Procedure: Anterior Cervical Decompression/ Discectomy Fusion Cervical Four-Cervical Five - Cervical Five-Cervical Six;  Surgeon: Tia Alert, MD;  Location: [facility] OR;  Service: Neurosurgery;  Laterality: N/A;  3C   BIOPSY  02/04/2020   Procedure: BIOPSY;  Surgeon: West Bali, MD;  Location: [facility] SUITE;  Service: Endoscopy;;   BRONCHOSCOPY     COLONOSCOPY WITH PROPOFOL N/A 02/04/2020   Procedure: COLONOSCOPY WITH PROPOFOL;  Surgeon: West Bali, MD;  Location: [facility];  Service: Endoscopy;  Laterality: N/A;  8:45am   INTRAMEDULLARY (IM) NAIL INTERTROCHANTERIC Right 10/17/2021   Procedure: INTRAMEDULLARY (IM) NAIL INTERTROCHANTRIC;  Surgeon: Durene Romans, MD;  Location: [facility] ORS;  Service: Orthopedics;   Laterality: Right;   KNEE ARTHROSCOPY WITH MENISCAL REPAIR Right 08/25/2020   Procedure: KNEE ARTHROSCOPY WITH  MEDIAL MENISCAL REPAIR;  Surgeon: Vickki Hearing, MD;  Location: [facility] ORS;  Service: Orthopedics;  Laterality: Right;   MULTIPLE TOOTH EXTRACTIONS     ORIF ANKLE FRACTURE Right 02/14/2021   Procedure: OPEN REDUCTION INTERNAL FIXATION (ORIF) ANKLE FRACTURE;  Surgeon: Sheral Apley, MD;  Location: [facility] OR;  Service: Orthopedics;  Laterality: Right;   ORIF FEMUR FRACTURE Right 02/14/2021   Procedure: OPEN REDUCTION INTERNAL FIXATION (ORIF) DISTAL FEMUR FRACTURE;  Surgeon: Sheral Apley, MD;  Location: [facility] OR;  Service: Orthopedics;  Laterality: Right;   POLYPECTOMY  02/04/2020   Procedure: POLYPECTOMY;  Surgeon: West Bali, MD;  Location: [facility];  Service: Endoscopy;;   rod in arm Left     OB History     Gravida  2   Para  0   Term  0   Preterm  0   AB  2   Living  0      SAB  2   IAB  0   Ectopic  0   Multiple  0   Live Births  0           Allergies  Allergen Reactions   Amoxicillin Nausea Only   Codeine Itching    Social History   Socioeconomic History   Marital status: Married    Spouse name: Not on file   Number  of children: Not on file   Years of education: Not on file   Highest education level: Not on file  Occupational History   Not on file  Tobacco Use   Smoking status: Every Day    Packs/day: 0.25    Years: 35.00    Additional pack years: 0.00    Total pack years: 8.75    Types: Cigarettes   Smokeless tobacco: Never   Tobacco comments:    I pack a day  Vaping Use   Vaping Use: Never used  Substance and Sexual Activity   Alcohol use: Not Currently    Comment: rarely   Drug use: Yes    Types: Marijuana    Comment: occasionally   Sexual activity: Not Currently    Birth control/protection: Post-menopausal  Other Topics Concern   Not on file  Social History Narrative   Are you right handed or left handed?  Right   Are you currently employed ?    What is your current occupation?disability   Do you live at home alone?   Who lives with you? husband   What type of home do you live in: 1 story or 2 story? one    Caffeine 2 cups of coffee, 1- 2 sodas   Social Determinants of Health   Financial Resource Strain: Not on file  Food Insecurity: No Food Insecurity (03/15/2023)   Hunger Vital Sign    Worried About Running Out of Food in the Last Year: Never true    Ran Out of Food in the Last Year: Never true  Transportation Needs: No Transportation Needs (03/15/2023)   PRAPARE - Administrator, Civil Service (Medical): No    Lack of Transportation (Non-Medical): No  Physical Activity: Not on file  Stress: Not on file  Social Connections: Not on file    Family History  Problem Relation Age of Onset   Throat cancer Mother    Lung cancer Father    Throat cancer Brother    Lung cancer Brother    Colon cancer Neg Hx    Breast cancer Neg Hx

## 2023-08-04 NOTE — Progress Notes (Deleted)
I saw Beth [name] in neurology clinic on 08/11/23 in follow up for weakness and cervical spine stenosis.  HPI: Beth [name] is a 62 y.o. year old female with a history of cervical spine stenosis s/p ACDF C4-6 (03/15/23), chronic pain (knee, neck, back pain on chronic opioid management), pre-DM, COPD, migraine, HTN, vit D deficiency, HLD, EtOH abuse, current smoker who we last saw on 01/27/23.  To briefly review: Patient's symptoms started in 08/2022. She had a lot of blood tests that were drawn. Later that day she was walking around her bed and her legs gave out and she fell to the floor. She does not think you were hurt. The next day her hands were tingling. A couple of weeks later her right leg started tingling (whole thing). The left leg started about 1 month ago. She is falling about once every 2 weeks. She feels week in arms and legs. Patient's husband agrees that patient's weakness came on very suddenly around 08/2022. She denies any trauma prior to symptoms or any known illness.   Patient has never taken a statin that she can recall.   Patient broke her hip in 09/2021 when a rug went out from her. This was not related to leg weakness.   Patient's normal weight is about 85 lbs. She is now down about 10 lbs since 08/2022. She is not eating as much as she was. She occasionally has night sweats, maybe twice per week at most.    Patient feels like she is caught up with cancer screening.   She denies cramps or twitching.   The patient denies symptoms suggestive of oculobulbar weakness including diplopia, ptosis, dysphagia, poor saliva control, dysarthria/dysphonia, impaired mastication, facial weakness/droop.   EtOH use: every couple of weeks she will have a couple of drinks; prior to 2020, patient was a very strong drinker, up to 24 beers.  Restrictive diet? No Family history of neuropathy/myopathy/NM disease? No  Most recent Assessment and Plan (01/27/23): This is a complex case. Her  symptoms are predominantly upper motor neuron and may best localize to the cervical spine. She does have a prior CT cervical spine from 01/2022 showing anterolisthesis at C4-C7 and a nonunited type 2 dens fracture and asymmetric positioning of the lateral masses at C1-C2. Her symptoms could be explained by cervical cord compression. If there is no evidence of this, then a mimic such as vitamin deficiency or a UMN predominant motor neuron disease is also possible. While she has a history of severe EtOH abuse, this is less likely neuropathy given UMN findings.   PLAN: -Blood work: B1, B12, folate, vit E, copper -MRI brain and cervical spine w/wo contrast - ordered stat. Patient understands that if she has acute worsening, she is to go to closest ED and mention my concern for cervical spine compression. -If MRI is unrevealing, would pursue EMG (?MND protocol (R > L))  Since their last visit: Labs were significant for low B1. Supplementation with thiamine 100 mg daily was recommended. ***   MRI brain showed no acute process, but right temporoparietal encephalomalacia (post ischemic or traumatic). MRI cervical spine severe canal stenosis with cord compression at C4-5 and severe bilateral foraminal stenosis at C4-5. There was also a cystic lesion seen at C5-6 without significant stenosis.   Due to this, patient was referred to NSGY. Patient had surgery on 03/15/23 (ACDF C4-6).Per NSGY notes from 06/27/23, patient had no pain and had made some improvements. She is going to PT.***  ROS: Pertinent positive and negative systems reviewed in HPI. ***   MEDICATIONS:  Outpatient Encounter Medications as of 08/11/2023  Medication Sig Note   acetaminophen (TYLENOL) 500 MG tablet Take 1,000 mg by mouth daily as needed for headache. (Patient not taking: Reported on 03/24/2023)    albuterol (PROVENTIL HFA;VENTOLIN HFA) 108 (90 BASE) MCG/ACT inhaler Inhale 1-2 puffs into the lungs every 6 (six) hours as needed for  wheezing or shortness of breath.    albuterol (PROVENTIL) (2.5 MG/3ML) 0.083% nebulizer solution Take 2.5 mg by nebulization every 6 (six) hours as needed for wheezing or shortness of breath.    alendronate (FOSAMAX) 70 MG tablet Take 70 mg by mouth once a week.    Cholecalciferol (VITAMIN D3) 250 MCG (10000 UT) TABS Take 10,000 Units by mouth every morning.    docusate sodium (COLACE) 100 MG capsule Take 1 capsule (100 mg total) by mouth 2 (two) times daily. (Patient taking differently: Take 100 mg by mouth daily as needed for moderate constipation.)    Fluticasone-Umeclidin-Vilant (TRELEGY ELLIPTA) 100-62.5-25 MCG/ACT AEPB Inhale 1 puff into the lungs daily.    Magnesium 250 MG TABS Take 250 mg by mouth every morning.    methocarbamol (ROBAXIN-750) 750 MG tablet Take 1 tablet (750 mg total) by mouth 4 (four) times daily.    Multiple Minerals (JOINT HEALTH MINERAL PO) Take 1 tablet by mouth daily.    Multiple Vitamin (MULTIVITAMIN WITH MINERALS) TABS tablet Take 1 tablet by mouth every morning.    naproxen sodium (ALEVE) 220 MG tablet Take 220 mg by mouth daily as needed (headache). (Patient not taking: Reported on 03/06/2023) 03/02/2023: ON HOLD    oxyCODONE-acetaminophen (PERCOCET/ROXICET) 5-325 MG tablet Take 1 tablet by mouth 4 (four) times daily as needed (pain).    promethazine (PHENERGAN) 25 MG tablet Take 25 mg by mouth every 6 (six) hours as needed for vomiting or nausea. (Patient not taking: Reported on 03/24/2023) 03/02/2023: Needs Refilled    thiamine (VITAMIN B-1) 100 MG tablet Take 100 mg by mouth daily.    No facility-administered encounter medications on file as of 08/11/2023.    PAST MEDICAL HISTORY: Past Medical History:  Diagnosis Date   Alcohol abuse 10/26/2011   Arthritis    Brain bleed (HCC) 2012   Cervical cancer [facility])    patient unsure- 30 years ago   COPD (chronic obstructive pulmonary disease) (HCC)    Displaced fracture of lateral malleolus of right fibula, initial  encounter for closed fracture 02/13/2021   Hypertension    Lung infection    Tobacco abuse     PAST SURGICAL HISTORY: Past Surgical History:  Procedure Laterality Date   ANTERIOR CERVICAL DECOMP/DISCECTOMY FUSION N/A 03/15/2023   Procedure: Anterior Cervical Decompression/ Discectomy Fusion Cervical Four-Cervical Five - Cervical Five-Cervical Six;  Surgeon: Tia Alert, MD;  Location: [facility] OR;  Service: Neurosurgery;  Laterality: N/A;  3C   BIOPSY  02/04/2020   Procedure: BIOPSY;  Surgeon: West Bali, MD;  Location: [facility] SUITE;  Service: Endoscopy;;   BRONCHOSCOPY     COLONOSCOPY WITH PROPOFOL N/A 02/04/2020   Procedure: COLONOSCOPY WITH PROPOFOL;  Surgeon: West Bali, MD;  Location: [facility];  Service: Endoscopy;  Laterality: N/A;  8:45am   INTRAMEDULLARY (IM) NAIL INTERTROCHANTERIC Right 10/17/2021   Procedure: INTRAMEDULLARY (IM) NAIL INTERTROCHANTRIC;  Surgeon: Durene Romans, MD;  Location: [facility] ORS;  Service: Orthopedics;  Laterality: Right;   KNEE ARTHROSCOPY WITH MENISCAL REPAIR Right 08/25/2020   Procedure: KNEE ARTHROSCOPY  WITH  MEDIAL MENISCAL REPAIR;  Surgeon: Vickki Hearing, MD;  Location: [facility] ORS;  Service: Orthopedics;  Laterality: Right;   MULTIPLE TOOTH EXTRACTIONS     ORIF ANKLE FRACTURE Right 02/14/2021   Procedure: OPEN REDUCTION INTERNAL FIXATION (ORIF) ANKLE FRACTURE;  Surgeon: Sheral Apley, MD;  Location: [facility] OR;  Service: Orthopedics;  Laterality: Right;   ORIF FEMUR FRACTURE Right 02/14/2021   Procedure: OPEN REDUCTION INTERNAL FIXATION (ORIF) DISTAL FEMUR FRACTURE;  Surgeon: Sheral Apley, MD;  Location: [facility] OR;  Service: Orthopedics;  Laterality: Right;   POLYPECTOMY  02/04/2020   Procedure: POLYPECTOMY;  Surgeon: West Bali, MD;  Location: [facility];  Service: Endoscopy;;   rod in arm Left     ALLERGIES: Allergies  Allergen Reactions   Amoxicillin Nausea Only   Codeine Itching    FAMILY HISTORY: Family History  Problem  Relation Age of Onset   Throat cancer Mother    Lung cancer Father    Throat cancer Brother    Lung cancer Brother    Colon cancer Neg Hx    Breast cancer Neg Hx     SOCIAL HISTORY: Social History   Tobacco Use   Smoking status: Every Day    Current packs/day: 0.25    Average packs/day: 0.3 packs/day for 35.0 years (8.8 ttl pk-yrs)    Types: Cigarettes   Smokeless tobacco: Never   Tobacco comments:    I pack a day  Vaping Use   Vaping status: Never Used  Substance Use Topics   Alcohol use: Not Currently    Comment: rarely   Drug use: Yes    Types: Marijuana    Comment: occasionally   Social History   Social History Narrative   Are you right handed or left handed? Right   Are you currently employed ?    What is your current occupation?disability   Do you live at home alone?   Who lives with you? husband   What type of home do you live in: 1 story or 2 story? one    Caffeine 2 cups of coffee, 1- 2 sodas    Objective:  Vital Signs:  LMP 10/08/2019 Comment: Still spotting since November 10  General:*** General appearance: Awake and alert. No distress. Cooperative with exam.  Skin: No obvious rash or jaundice. HEENT: Atraumatic. Anicteric. Lungs: Non-labored breathing on room air  Heart: Regular Abdomen: Soft, non tender. Extremities: No edema. No obvious deformity.  Musculoskeletal: No obvious joint swelling.  Neurological: Mental Status: Alert. Speech fluent. No pseudobulbar affect Cranial Nerves: CNII: No RAPD. Visual fields intact. CNIII, IV, VI: PERRL. No nystagmus. EOMI. CN V: Facial sensation intact bilaterally to fine touch. [place] clench strong. Jaw jerk***. CN VII: Facial muscles symmetric and strong. No ptosis at rest or after sustained upgaze***. CN VIII: Hears finger rub well bilaterally. CN IX: No hypophonia. CN X: Palate elevates symmetrically. CN XI: Full strength shoulder shrug bilaterally. CN XII: Tongue protrusion full and midline.  No atrophy or fasciculations. No significant dysarthria*** Motor: Tone is ***. *** fasciculations in *** extremities. *** atrophy. No grip or percussive myotonia.  Individual muscle group testing (MRC grade out of 5):  Movement     Neck flexion ***    Neck extension ***     Right Left   Shoulder abduction *** ***   Shoulder adduction *** ***   Shoulder ext rotation *** ***   Shoulder int rotation *** ***   Elbow flexion *** ***  Elbow extension *** ***   Wrist extension *** ***   Wrist flexion *** ***   Finger abduction - FDI *** ***   Finger abduction - ADM *** ***   Finger extension *** ***   Finger distal flexion - 2/3 *** ***   Finger distal flexion - 4/5 *** ***   Thumb flexion - FPL *** ***   Thumb abduction - APB *** ***    Hip flexion *** ***   Hip extension *** ***   Hip adduction *** ***   Hip abduction *** ***   Knee extension *** ***   Knee flexion *** ***   Dorsiflexion *** ***   Plantarflexion *** ***   Inversion *** ***   Eversion *** ***   Great toe extension *** ***   Great toe flexion *** ***     Reflexes:  Right Left  Bicep *** ***  Tricep *** ***  BrRad *** ***  Knee *** ***  Ankle *** ***   Pathological Reflexes: Babinski: *** response bilaterally*** Hoffman: *** Troemner: *** Pectoral: *** Palmomental: *** Facial: *** Midline tap: *** Sensation: Pinprick: *** Vibration: *** Temperature: *** Proprioception: *** Coordination: Intact finger-to- nose-finger and heel-to-shin bilaterally. Romberg negative.*** Gait: Able to rise from chair with arms crossed unassisted. Normal, narrow-based gait. Able to tandem walk. Able to walk on toes and heels.***   Lab and Test Review: New results: 01/27/23: Copper wnl Vit E wnl Folate wnl B12: 431 B1: < 7   MRI cervical spine w/wo contrast (02/04/23): FINDINGS: Alignment: Chronic grade 1-2 anterolisthesis of C4 on C5 and grade 1 anterolisthesis of C5 on C6, and C6 on C7. Slightly  exaggerated cervical lordosis.   Vertebrae: Chronic type 2 dens fracture with similar posterior angulation. No residual marrow edema at the fracture site. No acute fracture. No evidence of discitis. No marrow replacing bone lesion. Mild discogenic endplate marrow changes at C4-5.   Cord: Cord compression at the C4-5 level. T2/STIR hyperintense signal within the cord at this location likely reflecting chronic myelomalacia given the diminutive size of the cord at this level. Cord edema is not excluded, particularly along the inferior aspect of the area of signal abnormality (series 6, image 8).   There is a well circumscribed cyst with along the posterior epidural space on the right centered at the C5-6 level measuring 1.3 x 0.9 x 0.6 cm (series 11, image 7; series 8, image 22). Cyst walls are low in signal on T1 and T2 with smooth peripheral enhancement. It is unclear if this represents a synovial cyst arising from the right facet joint at C5-6 or C4-5 or represents a ganglion cyst arising from the ligamentum flavum. No additional epidural space abnormalities. No epidural fluid collection.   Posterior Fossa, vertebral arteries, paraspinal tissues: Negative.   Disc levels:   C2-C3: No disc protrusion. Bilateral facet joint arthropathy. No significant foraminal stenosis. No canal stenosis.   C3-C4: Small disc osteophyte complex with bilateral facet and uncovertebral spurring. Mild bilateral foraminal stenosis. No canal stenosis.   C4-C5: Disc osteophyte complex with bilateral facet and uncovertebral arthropathy. Severe canal stenosis with cord compression. Severe bilateral foraminal stenosis.   C5-C6: Disc osteophyte complex with bilateral facet arthropathy and uncovertebral spurring. There is mass effect from the cystic lesion in the posterior epidural space on the right. Findings contribute to mild stenosis of the canal on the right. Moderate bilateral foraminal stenosis.    C6-C7: Mild disc osteophyte complex with facet arthropathy. No foraminal or  canal stenosis.   C7-T1: No significant disc protrusion, foraminal stenosis, or canal stenosis.   IMPRESSION: 1. Severe canal stenosis with cord compression at the C4-5 level. High signal within the cord at this location likely reflecting chronic myelomalacia given the diminutive size of the cord at this level. Cord edema is not excluded. 2. Well-circumscribed cystic lesion in the posterior epidural space on the right centered at the C5-6 level measuring 1.3 x 0.9 x 0.6 cm. It is unclear if this represents a facet synovial cyst versus a ganglion cyst arising from the ligamentum flavum. Findings contribute to mild stenosis of the canal on the right at C5-6. 3. Severe bilateral foraminal stenosis at C4-5. Moderate bilateral foraminal stenosis at C5-6. 4. Chronic type 2 dens fracture with similar posterior angulation. No residual marrow edema at the fracture site. No acute fractures.   These results will be called to the ordering clinician or representative by the Radiologist Assistant, and communication documented in the PACS or [employer].   MRI brain w/wo contrast (02/04/23): FINDINGS: Brain: There is no evidence of an acute infarct, intracranial hemorrhage, mass, midline shift, or extra-axial fluid collection. Small T2 hyperintensities in the cerebral white matter bilaterally are nonspecific but compatible with mild chronic small vessel ischemic disease. Mild cortical encephalomalacia laterally at the right temporoparietal junction is unchanged. There is very mild generalized cerebral atrophy. No abnormal enhancement is identified.   Vascular: Major intracranial vascular flow voids are preserved.   Skull and upper cervical spine: No suspicious marrow lesion. Cervical spine reported separately.   Sinuses/Orbits: Remote left orbital fracture. Minimal mucosal thickening in the paranasal sinuses.  Clear mastoid air cells.   Other: None.   IMPRESSION: 1. No acute intracranial abnormality. 2. Mild chronic small vessel ischemic disease. 3. Right temporoparietal encephalomalacia which could be post ischemic or traumatic.  Cervical spine xray (03/15/23): FINDINGS: One fluoroscopic image obtained during the performance of the procedure and provided for interpretation only. Images demonstrate ACDF hardware at C4-C6.   Fluoroscopy time: 17 seconds   Cumulative air kerma: 1.33 mGy   IMPRESSION: Intraoperative fluoroscopy for ACDF C4-C6.   Previously reviewed results: External labs: CMP (11/04/22): unremarkable CBC (11/04/22): unremarkable HbA1c (11/04/22): 5.5 ESR (08/05/22): 5 ANA (08/05/22): negative RF (08/05/22): < 7 TSH (08/05/22): 0.628 Vit D (08/05/22): 64.75   Imaging: CT head and cervical spine wo contrast (02/12/22): FINDINGS: CT HEAD FINDINGS   Brain: No evidence of acute infarction, hemorrhage, hydrocephalus, extra-axial collection, visible mass lesion or mass effect.   Vascular: Atherosclerotic calcification of the carotid siphons. No hyperdense vessel.   Skull: Chronic right supraorbital scarring. No acute soft tissue swelling, gas or laceration. No large scalp hematoma. No calvarial fracture or acute osseous injury within the included levels of imaging.   Sinuses/Orbits: Chronic rightward anterior nasal septal deviation and in contacting left-sided nasal septal spur. Paranasal sinuses and mastoid air cells are predominantly clear. Middle ear cavities are clear. Included orbital structures are unremarkable.   Other: None   CT CERVICAL SPINE FINDINGS   Alignment: Slight exaggeration of the upper cervical lordosis. Unchanged likely degenerative stepwise anterolisthesis C4-C7. Grossly unchanged alignment of a nonunited type 2 dens fracture and the asymmetric positioning of the lateral masses C1-C2. No acute traumatic listhesis is seen.   Skull base and  vertebrae: Redemonstration of the chronic, well corticated type 2 dens fracture with posterior translation and angulation. No acute cervical spine fracture is seen. No visible skull base fractures or other acute osseous abnormalities. No suspicious osseous  lesions. Cervical spondylitic changes as below. Additional arthrosis about the atlantodental interval and basion dens intervals, unchanged from prior.   Soft tissues and spinal canal: No pre or paravertebral fluid or swelling. No visible canal hematoma.   Disc levels: Multilevel intervertebral disc height loss with spondylitic endplate changes. At most mild resulting canal stenosis C4-5, C5-6. Multilevel uncinate spurring and facet hypertrophic changes are present as well maximal C4-C7 where there is mild-to-moderate foraminal narrowing bilaterally.   Upper chest: No acute abnormality in the upper chest or imaged lung apices. Stable emphysematous changes.   Other: No concerning thyroid nodule or mass.   IMPRESSION: 1. No acute intracranial abnormality. 2. Grossly unchanged alignment of the chronic, well corticated type 2 dens fracture with posterior translation and angulation. 3. No acute cervical spine fracture or traumatic listhesis. 4. Multilevel degenerative changes of the cervical spine as described above.  ASSESSMENT: This is Beth [name], a 62 y.o. female with:  ***  Plan: ***  Return to clinic in ***  Total time spent reviewing records, interview, history/exam, documentation, and coordination of care on day of encounter:  *** min  Jacquelyne Balint, MD

## 2023-08-11 ENCOUNTER — Ambulatory Visit: Payer: Medicaid Other | Admitting: Neurology

## 2024-02-02 ENCOUNTER — Ambulatory Visit: Payer: Medicaid Other | Admitting: Neurology

## 2024-02-14 NOTE — Progress Notes (Signed)
 I saw Beth [name] in neurology clinic on 02/23/24 in follow up for weakness and cervical spine stenosis.  HPI: Beth [name] is a 63 y.o. year old female with a history of cervical spine stenosis s/p ACDF C4-6 (03/15/23), chronic pain (knee, neck, back pain on chronic opioid management), pre-DM, COPD, migraine, HTN, vit D deficiency, HLD, EtOH abuse, current smoker who we last saw on 01/27/23.  To briefly review: Patient's symptoms started in 08/2022. She had a lot of blood tests that were drawn. Later that day she was walking around her bed and her legs gave out and she fell to the floor. She does not think you were hurt. The next day her hands were tingling. A couple of weeks later her right leg started tingling (whole thing). The left leg started about 1 month ago. She is falling about once every 2 weeks. She feels week in arms and legs. Patient's husband agrees that patient's weakness came on very suddenly around 08/2022. She denies any trauma prior to symptoms or any known illness.   Patient has never taken a statin that she can recall.   Patient broke her hip in 09/2021 when a rug went out from her. This was not related to leg weakness.   Patient's normal weight is about 85 lbs. She is now down about 10 lbs since 08/2022. She is not eating as much as she was. She occasionally has night sweats, maybe twice per week at most.    Patient feels like she is caught up with cancer screening.   She denies cramps or twitching.   The patient denies symptoms suggestive of oculobulbar weakness including diplopia, ptosis, dysphagia, poor saliva control, dysarthria/dysphonia, impaired mastication, facial weakness/droop.   EtOH use: every couple of weeks she will have a couple of drinks; prior to 2020, patient was a very strong drinker, up to 24 beers.  Restrictive diet? No Family history of neuropathy/myopathy/NM disease? No  Most recent Assessment and Plan (01/27/23): This is a complex case. Her  symptoms are predominantly upper motor neuron and may best localize to the cervical spine. She does have a prior CT cervical spine from 01/2022 showing anterolisthesis at C4-C7 and a nonunited type 2 dens fracture and asymmetric positioning of the lateral masses at C1-C2. Her symptoms could be explained by cervical cord compression. If there is no evidence of this, then a mimic such as vitamin deficiency or a UMN predominant motor neuron disease is also possible. While she has a history of severe EtOH abuse, this is less likely neuropathy given UMN findings.   PLAN: -Blood work: B1, B12, folate, vit E, copper -MRI brain and cervical spine w/wo contrast - ordered stat. Patient understands that if she has acute worsening, she is to go to closest ED and mention my concern for cervical spine compression. -If MRI is unrevealing, would pursue EMG (?MND protocol (R > L))  Since their last visit: Labs were significant for low B1. Supplementation with thiamine 100 mg daily was recommended. She is taking this, B12, vit D, and multivitamin.   MRI brain showed no acute process, but right temporoparietal encephalomalacia (post ischemic or traumatic). MRI cervical spine severe canal stenosis with cord compression at C4-5 and severe bilateral foraminal stenosis at C4-5. There was also a cystic lesion seen at C5-6 without significant stenosis.   Due to this, patient was referred to NSGY. Patient had surgery on 03/15/23 (ACDF C4-6).Per NSGY notes from 06/27/23, patient had no pain and had made  some improvements. She is going to PT. She thinks her walking is much improved.  Currently, she has numbness in her hands. This prevents her from doing much with her hands. She occasionally has numbness and tingling in her feet and occasional weakness. She has back pain as well.  She still only seldom has EtOH. She has lost another 5 lbs in the last year and 1 lbs in the last month. She states she is eating well, but maybe only  twice per day. She does not use ensure.   MEDICATIONS:  Outpatient Encounter Medications as of 02/23/2024  Medication Sig Note   acetaminophen (TYLENOL) 500 MG tablet Take 1,000 mg by mouth daily as needed for headache.    albuterol (PROVENTIL HFA;VENTOLIN HFA) 108 (90 BASE) MCG/ACT inhaler Inhale 1-2 puffs into the lungs every 6 (six) hours as needed for wheezing or shortness of breath.    albuterol (PROVENTIL) (2.5 MG/3ML) 0.083% nebulizer solution Take 2.5 mg by nebulization every 6 (six) hours as needed for wheezing or shortness of breath.    alendronate (FOSAMAX) 70 MG tablet Take 70 mg by mouth once a week.    Cholecalciferol (VITAMIN D3) 250 MCG (10000 UT) TABS Take 10,000 Units by mouth every morning.    docusate sodium (COLACE) 100 MG capsule Take 1 capsule (100 mg total) by mouth 2 (two) times daily. (Patient taking differently: Take 100 mg by mouth daily as needed for moderate constipation.)    Fluticasone-Umeclidin-Vilant (TRELEGY ELLIPTA) 100-62.5-25 MCG/ACT AEPB Inhale 1 puff into the lungs daily.    gabapentin (NEURONTIN) 300 MG capsule Take 300 mg by mouth at bedtime.    Magnesium 250 MG TABS Take 250 mg by mouth every morning.    methocarbamol (ROBAXIN-750) 750 MG tablet Take 1 tablet (750 mg total) by mouth 4 (four) times daily.    Multiple Minerals (JOINT HEALTH MINERAL PO) Take 1 tablet by mouth daily.    Multiple Vitamin (MULTIVITAMIN WITH MINERALS) TABS tablet Take 1 tablet by mouth every morning.    oxyCODONE-acetaminophen (PERCOCET/ROXICET) 5-325 MG tablet Take 1 tablet by mouth 4 (four) times daily as needed (pain).    promethazine (PHENERGAN) 25 MG tablet Take 25 mg by mouth every 6 (six) hours as needed for vomiting or nausea. 03/02/2023: Needs Refilled    thiamine (VITAMIN B-1) 100 MG tablet Take 100 mg by mouth daily.    naproxen sodium (ALEVE) 220 MG tablet Take 220 mg by mouth daily as needed (headache). (Patient not taking: Reported on 03/06/2023) 03/02/2023: ON HOLD     No facility-administered encounter medications on file as of 02/23/2024.    PAST MEDICAL HISTORY: Past Medical History:  Diagnosis Date   Alcohol abuse 10/26/2011   Arthritis    Brain bleed (HCC) 2012   Cervical cancer [facility])    patient unsure- 30 years ago   COPD (chronic obstructive pulmonary disease) (HCC)    Displaced fracture of lateral malleolus of right fibula, initial encounter for closed fracture 02/13/2021   Hypertension    Lung infection    Tobacco abuse     PAST SURGICAL HISTORY: Past Surgical History:  Procedure Laterality Date   ANTERIOR CERVICAL DECOMP/DISCECTOMY FUSION N/A 03/15/2023   Procedure: Anterior Cervical Decompression/ Discectomy Fusion Cervical Four-Cervical Five - Cervical Five-Cervical Six;  Surgeon: Tia Alert, MD;  Location: [facility] OR;  Service: Neurosurgery;  Laterality: N/A;  3C   BIOPSY  02/04/2020   Procedure: BIOPSY;  Surgeon: West Bali, MD;  Location: [facility] SUITE;  Service: Endoscopy;;   BRONCHOSCOPY     COLONOSCOPY WITH PROPOFOL N/A 02/04/2020   Procedure: COLONOSCOPY WITH PROPOFOL;  Surgeon: West Bali, MD;  Location: [facility];  Service: Endoscopy;  Laterality: N/A;  8:45am   INTRAMEDULLARY (IM) NAIL INTERTROCHANTERIC Right 10/17/2021   Procedure: INTRAMEDULLARY (IM) NAIL INTERTROCHANTRIC;  Surgeon: Durene Romans, MD;  Location: [facility] ORS;  Service: Orthopedics;  Laterality: Right;   KNEE ARTHROSCOPY WITH MENISCAL REPAIR Right 08/25/2020   Procedure: KNEE ARTHROSCOPY WITH  MEDIAL MENISCAL REPAIR;  Surgeon: Vickki Hearing, MD;  Location: [facility] ORS;  Service: Orthopedics;  Laterality: Right;   MULTIPLE TOOTH EXTRACTIONS     ORIF ANKLE FRACTURE Right 02/14/2021   Procedure: OPEN REDUCTION INTERNAL FIXATION (ORIF) ANKLE FRACTURE;  Surgeon: Sheral Apley, MD;  Location: [facility] OR;  Service: Orthopedics;  Laterality: Right;   ORIF FEMUR FRACTURE Right 02/14/2021   Procedure: OPEN REDUCTION INTERNAL FIXATION (ORIF) DISTAL FEMUR FRACTURE;   Surgeon: Sheral Apley, MD;  Location: [facility] OR;  Service: Orthopedics;  Laterality: Right;   POLYPECTOMY  02/04/2020   Procedure: POLYPECTOMY;  Surgeon: West Bali, MD;  Location: [facility];  Service: Endoscopy;;   rod in arm Left     ALLERGIES: Allergies  Allergen Reactions   Amoxicillin Nausea Only   Codeine Itching    FAMILY HISTORY: Family History  Problem Relation Age of Onset   Throat cancer Mother    Lung cancer Father    Throat cancer Brother    Lung cancer Brother    Colon cancer Neg Hx    Breast cancer Neg Hx     SOCIAL HISTORY: Social History   Tobacco Use   Smoking status: Every Day    Current packs/day: 0.25    Average packs/day: 0.3 packs/day for 35.0 years (8.8 ttl pk-yrs)    Types: Cigarettes   Smokeless tobacco: Never   Tobacco comments:    I pack a day  Vaping Use   Vaping status: Never Used  Substance Use Topics   Alcohol use: Not Currently    Comment: rarely   Drug use: Yes    Types: Marijuana    Comment: occasionally   Social History   Social History Narrative   Are you right handed or left handed? Right   Are you currently employed ?    What is your current occupation?disability   Do you live at home alone?   Who lives with you? husband   What type of home do you live in: 1 story or 2 story? one    Caffeine 2 cups of coffee, 1- 2 sodas    Objective:  Vital Signs:  BP 137/87   Pulse 76   Ht 5\' 2"  (1.575 m)   Wt 81 lb (36.7 kg)   LMP 10/08/2019 Comment: Still spotting since November 10  SpO2 99%   BMI 14.82 kg/m   General: General appearance: Awake and alert. No distress. Cooperative with exam.  Skin: No obvious rash or jaundice. HEENT: Atraumatic. Anicteric. Lungs: Non-labored breathing on room air  Extremities: No edema.  Neurological: Mental Status: Alert. Speech fluent. No pseudobulbar affect Cranial Nerves: CNII: No RAPD. Visual fields intact. CNIII, IV, VI: PERRL. No nystagmus. EOMI. CN V: Facial  sensation intact bilaterally to fine touch. CN VII: Facial muscles symmetric and strong. No ptosis at rest. CN VIII: Hears finger rub well bilaterally. CN IX: No hypophonia. CN X: Palate elevates symmetrically. CN XI: Full strength shoulder shrug bilaterally. CN XII: Tongue  protrusion full and midline. No atrophy or fasciculations. No significant dysarthria Motor: Tone is normal. No fasciculations seen in any extremities.  Individual muscle group testing (MRC grade out of 5):  Movement     Neck flexion 5    Neck extension 5     Right Left   Shoulder abduction 5 5   Shoulder adduction 5 5   Elbow flexion 5 5   Elbow extension 5 5   Finger abduction - FDI 5- 4+   Finger abduction - ADM 4+ 4+   Finger extension 5- 5-   Finger distal flexion - 2/3 4+ 4+   Finger distal flexion - 4/5 4+ 4+   Thumb flexion - FPL 4+ 4+   Thumb abduction - APB 4+ 4+    Hip flexion 4+ 4+   Hip extension 5 5-   Hip adduction 5 5   Hip abduction 5 5   Knee extension 5 5   Knee flexion 5 5   Dorsiflexion 5- 5   Plantarflexion 5 5    Reflexes:  Right Left  Bicep 2-3+ 2-3+  Tricep 2-3+ 2-3+  BrRad 2-3+ 2-3+  Knee 3+ 3+  Ankle 2+ 2+   Pathological Reflexes: No clonus today.  Babinski: extensor response bilaterally Hoffman: present bilaterally Troemner: present bilaterally Sensation: Pinprick: Diminished in left > right leg Coordination: Intact finger-to- nose-finger bilaterally. Romberg with sway. Gait: Narrow based, unsteady gait, but with no assistance today. Right leg more stiff than left.   Lab and Test Review: New results: 01/27/23: Copper wnl Vit E wnl Folate wnl B12: 431 B1: < 7   MRI cervical spine w/wo contrast (02/04/23): FINDINGS: Alignment: Chronic grade 1-2 anterolisthesis of C4 on C5 and grade 1 anterolisthesis of C5 on C6, and C6 on C7. Slightly exaggerated cervical lordosis.   Vertebrae: Chronic type 2 dens fracture with similar posterior angulation. No residual  marrow edema at the fracture site. No acute fracture. No evidence of discitis. No marrow replacing bone lesion. Mild discogenic endplate marrow changes at C4-5.   Cord: Cord compression at the C4-5 level. T2/STIR hyperintense signal within the cord at this location likely reflecting chronic myelomalacia given the diminutive size of the cord at this level. Cord edema is not excluded, particularly along the inferior aspect of the area of signal abnormality (series 6, image 8).   There is a well circumscribed cyst with along the posterior epidural space on the right centered at the C5-6 level measuring 1.3 x 0.9 x 0.6 cm (series 11, image 7; series 8, image 22). Cyst walls are low in signal on T1 and T2 with smooth peripheral enhancement. It is unclear if this represents a synovial cyst arising from the right facet joint at C5-6 or C4-5 or represents a ganglion cyst arising from the ligamentum flavum. No additional epidural space abnormalities. No epidural fluid collection.   Posterior Fossa, vertebral arteries, paraspinal tissues: Negative.   Disc levels:   C2-C3: No disc protrusion. Bilateral facet joint arthropathy. No significant foraminal stenosis. No canal stenosis.   C3-C4: Small disc osteophyte complex with bilateral facet and uncovertebral spurring. Mild bilateral foraminal stenosis. No canal stenosis.   C4-C5: Disc osteophyte complex with bilateral facet and uncovertebral arthropathy. Severe canal stenosis with cord compression. Severe bilateral foraminal stenosis.   C5-C6: Disc osteophyte complex with bilateral facet arthropathy and uncovertebral spurring. There is mass effect from the cystic lesion in the posterior epidural space on the right. Findings contribute to mild stenosis of the  canal on the right. Moderate bilateral foraminal stenosis.   C6-C7: Mild disc osteophyte complex with facet arthropathy. No foraminal or canal stenosis.   C7-T1: No significant disc  protrusion, foraminal stenosis, or canal stenosis.   IMPRESSION: 1. Severe canal stenosis with cord compression at the C4-5 level. High signal within the cord at this location likely reflecting chronic myelomalacia given the diminutive size of the cord at this level. Cord edema is not excluded. 2. Well-circumscribed cystic lesion in the posterior epidural space on the right centered at the C5-6 level measuring 1.3 x 0.9 x 0.6 cm. It is unclear if this represents a facet synovial cyst versus a ganglion cyst arising from the ligamentum flavum. Findings contribute to mild stenosis of the canal on the right at C5-6. 3. Severe bilateral foraminal stenosis at C4-5. Moderate bilateral foraminal stenosis at C5-6. 4. Chronic type 2 dens fracture with similar posterior angulation. No residual marrow edema at the fracture site. No acute fractures.   These results will be called to the ordering clinician or representative by the Radiologist Assistant, and communication documented in the PACS or [employer].   MRI brain w/wo contrast (02/04/23): FINDINGS: Brain: There is no evidence of an acute infarct, intracranial hemorrhage, mass, midline shift, or extra-axial fluid collection. Small T2 hyperintensities in the cerebral white matter bilaterally are nonspecific but compatible with mild chronic small vessel ischemic disease. Mild cortical encephalomalacia laterally at the right temporoparietal junction is unchanged. There is very mild generalized cerebral atrophy. No abnormal enhancement is identified.   Vascular: Major intracranial vascular flow voids are preserved.   Skull and upper cervical spine: No suspicious marrow lesion. Cervical spine reported separately.   Sinuses/Orbits: Remote left orbital fracture. Minimal mucosal thickening in the paranasal sinuses. Clear mastoid air cells.   Other: None.   IMPRESSION: 1. No acute intracranial abnormality. 2. Mild chronic small vessel  ischemic disease. 3. Right temporoparietal encephalomalacia which could be post ischemic or traumatic.   Cervical spine xray (03/15/23): FINDINGS: One fluoroscopic image obtained during the performance of the procedure and provided for interpretation only. Images demonstrate ACDF hardware at C4-C6.   Fluoroscopy time: 17 seconds   Cumulative air kerma: 1.33 mGy   IMPRESSION: Intraoperative fluoroscopy for ACDF C4-C6.   Previously reviewed results: External labs: CMP (11/04/22): unremarkable CBC (11/04/22): unremarkable HbA1c (11/04/22): 5.5 ESR (08/05/22): 5 ANA (08/05/22): negative RF (08/05/22): < 7 TSH (08/05/22): 0.628 Vit D (08/05/22): 64.75   Imaging: CT head and cervical spine wo contrast (02/12/22): FINDINGS: CT HEAD FINDINGS   Brain: No evidence of acute infarction, hemorrhage, hydrocephalus, extra-axial collection, visible mass lesion or mass effect.   Vascular: Atherosclerotic calcification of the carotid siphons. No hyperdense vessel.   Skull: Chronic right supraorbital scarring. No acute soft tissue swelling, gas or laceration. No large scalp hematoma. No calvarial fracture or acute osseous injury within the included levels of imaging.   Sinuses/Orbits: Chronic rightward anterior nasal septal deviation and in contacting left-sided nasal septal spur. Paranasal sinuses and mastoid air cells are predominantly clear. Middle ear cavities are clear. Included orbital structures are unremarkable.   Other: None   CT CERVICAL SPINE FINDINGS   Alignment: Slight exaggeration of the upper cervical lordosis. Unchanged likely degenerative stepwise anterolisthesis C4-C7. Grossly unchanged alignment of a nonunited type 2 dens fracture and the asymmetric positioning of the lateral masses C1-C2. No acute traumatic listhesis is seen.   Skull base and vertebrae: Redemonstration of the chronic, well corticated type 2 dens fracture with posterior translation  and angulation. No  acute cervical spine fracture is seen. No visible skull base fractures or other acute osseous abnormalities. No suspicious osseous lesions. Cervical spondylitic changes as below. Additional arthrosis about the atlantodental interval and basion dens intervals, unchanged from prior.   Soft tissues and spinal canal: No pre or paravertebral fluid or swelling. No visible canal hematoma.   Disc levels: Multilevel intervertebral disc height loss with spondylitic endplate changes. At most mild resulting canal stenosis C4-5, C5-6. Multilevel uncinate spurring and facet hypertrophic changes are present as well maximal C4-C7 where there is mild-to-moderate foraminal narrowing bilaterally.   Upper chest: No acute abnormality in the upper chest or imaged lung apices. Stable emphysematous changes.   Other: No concerning thyroid nodule or mass.   IMPRESSION: 1. No acute intracranial abnormality. 2. Grossly unchanged alignment of the chronic, well corticated type 2 dens fracture with posterior translation and angulation. 3. No acute cervical spine fracture or traumatic listhesis. 4. Multilevel degenerative changes of the cervical spine as described above.  ASSESSMENT: This is Beth [name], a 63 y.o. female with weakness and tingling in arms and legs and imbalance. She was previously found to have low B1, for which she now takes supplementation, and cervical spine stenosis now s/p decompression (ACDF C4-6). Her walking is much improved from prior. She does have some residual weakness and tingling and numbness in her arms that may be permanent due to the prior cervical stenosis/radiculopathy. Her arms are somewhat stronger than prior though. She has some proximal lower extremity weakness today and sensory deficit now in LLE (was in RLE at last visit). Given this asymmetry, a lumbar radiculopathy is possible and would be masked by old cervical stenosis. Physical deconditioning could also be  contributing.  Plan: -Discussed EMG, but patient would like to defer and get MRI instead -MRI lumbar spine wo contrast -Continue B1, B12, and vit D supplementation -Physical therapy for ongoing weakness, particularly in proximal lower extremities -Increase gabapentin to 600 mg at bedtime -Fall precautions discussed  Return to clinic in 6 months  Total time spent reviewing records, interview, history/exam, documentation, and coordination of care on day of encounter:  35 min  Jacquelyne Balint, MD

## 2024-02-23 ENCOUNTER — Encounter: Payer: Self-pay | Admitting: Neurology

## 2024-02-23 ENCOUNTER — Ambulatory Visit: Payer: Medicaid Other | Admitting: Neurology

## 2024-02-23 VITALS — BP 137/87 | HR 76 | Ht 62.0 in | Wt 81.0 lb

## 2024-02-23 DIAGNOSIS — R531 Weakness: Secondary | ICD-10-CM | POA: Diagnosis not present

## 2024-02-23 DIAGNOSIS — R269 Unspecified abnormalities of gait and mobility: Secondary | ICD-10-CM

## 2024-02-23 DIAGNOSIS — R292 Abnormal reflex: Secondary | ICD-10-CM

## 2024-02-23 DIAGNOSIS — R2689 Other abnormalities of gait and mobility: Secondary | ICD-10-CM

## 2024-02-23 DIAGNOSIS — R5381 Other malaise: Secondary | ICD-10-CM

## 2024-02-23 DIAGNOSIS — M4802 Spinal stenosis, cervical region: Secondary | ICD-10-CM

## 2024-02-23 DIAGNOSIS — R200 Anesthesia of skin: Secondary | ICD-10-CM

## 2024-02-23 DIAGNOSIS — R202 Paresthesia of skin: Secondary | ICD-10-CM

## 2024-02-23 DIAGNOSIS — G9520 Unspecified cord compression: Secondary | ICD-10-CM

## 2024-02-23 MED ORDER — GABAPENTIN 300 MG PO CAPS
600.0000 mg | ORAL_CAPSULE | Freq: Every day | ORAL | 11 refills | Status: AC
Start: 2024-02-23 — End: ?

## 2024-02-23 NOTE — Patient Instructions (Signed)
 I saw you today for weakness, numbness, and tingling. You seem improved since your neck surgery last year. You still have lingering symptoms in the arms and maybe the legs. There is a chance that the symptoms are also due to pinched nerves in the back. I would like to look with an MRI of your lumbar spine (low back).  I will increase your gabapentin to 600 mg at night to help with pain.  I am referring you to physical therapy.  Continue B1 100 mg daily.  Continue B12 1000 mcg daily.  Continue vitamin D 1000 international units daily.  The physicians and staff at [facility] are committed to providing excellent care. You may receive a survey requesting feedback about your experience at our office. We strive to receive "very good" responses to the survey questions. If you feel that your experience would prevent you from giving the office a "very good " response, please contact our office to try to remedy the situation. We may be reached at [phone]. Thank you for taking the time out of your busy day to complete the survey.  Jacquelyne Balint, MD [facility]  Preventing Falls at [facility] are common, often dreaded events in the lives of older people. Aside from the obvious injuries and even death that may result, fall can cause wide-ranging consequences including loss of independence, mental decline, decreased activity and mobility. Younger people are also at risk of falling, especially those with chronic illnesses and fatigue.  Ways to reduce risk for falling Examine diet and medications. Warm foods and alcohol dilate blood vessels, which can lead to dizziness when standing. Sleep aids, antidepressants and pain medications can also increase the likelihood of a fall.  Get a vision exam. Poor vision, cataracts and glaucoma increase the chances of falling.  Check foot gear. Shoes should fit snugly and have a sturdy, nonskid sole and a broad, low heel  Participate in a  physician-approved exercise program to build and maintain muscle strength and improve balance and coordination. Programs that use ankle weights or stretch bands are excellent for muscle-strengthening. Water aerobics programs and low-impact Tai Chi programs have also been shown to improve balance and coordination.  Increase vitamin D intake. Vitamin D improves muscle strength and increases the amount of calcium the body is able to absorb and deposit in bones.  How to prevent falls from common hazards Floors - Remove all loose wires, cords, and throw rugs. Minimize clutter. Make sure rugs are anchored and smooth. Keep furniture in its usual place.  Chairs -- Use chairs with straight backs, armrests and firm seats. Add firm cushions to existing pieces to add height.  Bathroom - Install grab bars and non-skid tape in the tub or shower. Use a bathtub transfer bench or a shower chair with a back support Use an elevated toilet seat and/or safety rails to assist standing from a low surface. Do not use towel racks or bathroom tissue holders to help you stand.  Lighting - Make sure halls, stairways, and entrances are well-lit. Install a night light in your bathroom or hallway. Make sure there is a light switch at the top and bottom of the staircase. Turn lights on if you get up in the middle of the night. Make sure lamps or light switches are within reach of the bed if you have to get up during the night.  Kitchen - Install non-skid rubber mats near the sink and stove. Clean spills immediately. Store frequently used utensils, pots,  pans between waist and eye level. This helps prevent reaching and bending. Sit when getting things out of lower cupboards.  Living room/ Bedrooms - Place furniture with wide spaces in between, giving enough room to move around. Establish a route through the living room that gives you something to hold onto as you walk.  Stairs - Make sure treads, rails, and rugs are secure. Install  a rail on both sides of the stairs. If stairs are a threat, it might be helpful to arrange most of your activities on the lower level to reduce the number of times you must climb the stairs.  Entrances and doorways - Install metal handles on the walls adjacent to the doorknobs of all doors to make it more secure as you travel through the doorway.  Tips for maintaining balance Keep at least one hand free at all times. Try using a backpack or fanny pack to hold things rather than carrying them in your hands. Never carry objects in both hands when walking as this interferes with keeping your balance.  Attempt to swing both arms from front to back while walking. This might require a conscious effort if Parkinson's disease has diminished your movement. It will, however, help you to maintain balance and posture, and reduce fatigue.  Consciously lift your feet off of the ground when walking. Shuffling and dragging of the feet is a common culprit in losing your balance.  When trying to navigate turns, use a "U" technique of facing forward and making a wide turn, rather than pivoting sharply.  Try to stand with your feet shoulder-length apart. When your feet are close together for any length of time, you increase your risk of losing your balance and falling.  Do one thing at a time. Don't try to walk and accomplish another task, such as reading or looking around. The decrease in your automatic reflexes complicates motor function, so the less distraction, the better.  Do not wear rubber or gripping soled shoes, they might "catch" on the floor and cause tripping.  Move slowly when changing positions. Use deliberate, concentrated movements and, if needed, use a grab bar or walking aid. Count 15 seconds between each movement. For example, when rising from a seated position, wait 15 seconds after standing to begin walking.  If balance is a continuous problem, you might want to consider a walking aid such as a  cane, walking stick, or walker. Once you've mastered walking with help, you might be ready to try it on your own again.

## 2024-02-27 ENCOUNTER — Telehealth: Payer: Self-pay

## 2024-02-28 ENCOUNTER — Telehealth: Payer: Self-pay | Admitting: Neurology

## 2024-02-28 ENCOUNTER — Other Ambulatory Visit: Payer: Self-pay

## 2024-02-28 DIAGNOSIS — R531 Weakness: Secondary | ICD-10-CM

## 2024-02-28 DIAGNOSIS — R269 Unspecified abnormalities of gait and mobility: Secondary | ICD-10-CM

## 2024-02-28 DIAGNOSIS — R200 Anesthesia of skin: Secondary | ICD-10-CM

## 2024-02-28 DIAGNOSIS — R2689 Other abnormalities of gait and mobility: Secondary | ICD-10-CM

## 2024-02-28 NOTE — Telephone Encounter (Signed)
 Attempted to call patient regarding MRI lumbar spine being denied by insurance. EMG would be another option, though she previously wanted to avoid this.  I left her a message and asked for a call back to our office.  Beth Balint, MD [facility]

## 2024-02-28 NOTE — Telephone Encounter (Signed)
Pt called back in returning Dr. Cathey Endow call

## 2024-03-12 ENCOUNTER — Telehealth: Payer: Self-pay | Admitting: Neurology

## 2024-03-12 NOTE — Telephone Encounter (Signed)
 Patient Dr.hill is out of the office this afternoon, When he returns he will advise of EMG or MRI.

## 2024-03-12 NOTE — Telephone Encounter (Signed)
 Pt called in stating her insurance was denied for her MRI at [facility], but it was approved to do it at [facility]. She hasn't heard anything from them to schedule. She wants to talk to someone. She says Dr. Genita Keys wasn't sure if she should do the MRI and the EMG.

## 2024-04-02 ENCOUNTER — Telehealth: Payer: Self-pay

## 2024-04-02 ENCOUNTER — Telehealth: Payer: Self-pay | Admitting: Neurology

## 2024-04-02 ENCOUNTER — Ambulatory Visit: Admitting: Neurology

## 2024-04-02 DIAGNOSIS — R531 Weakness: Secondary | ICD-10-CM | POA: Diagnosis not present

## 2024-04-02 DIAGNOSIS — M5417 Radiculopathy, lumbosacral region: Secondary | ICD-10-CM

## 2024-04-02 DIAGNOSIS — R2689 Other abnormalities of gait and mobility: Secondary | ICD-10-CM

## 2024-04-02 DIAGNOSIS — R269 Unspecified abnormalities of gait and mobility: Secondary | ICD-10-CM

## 2024-04-02 DIAGNOSIS — R200 Anesthesia of skin: Secondary | ICD-10-CM

## 2024-04-02 DIAGNOSIS — R202 Paresthesia of skin: Secondary | ICD-10-CM

## 2024-04-02 NOTE — Telephone Encounter (Signed)
 Discussed the results of patient's EMG after the procedure today. 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 This is also not supported as well by her examination, but is still a consideration.  I again recommended MRI lumbar spine which was previously denied by insurance. I will reorder and attempt to get this approved now that there is more evidence of lumbar radiculopathy.  All questions were answered.  Rommie Coats, MD [facility]

## 2024-04-02 NOTE — Telephone Encounter (Signed)
 Called [facility] x2 and left a message to call our office. I am faxing orders again.

## 2024-04-02 NOTE — Procedures (Signed)
 [facility]  [address] [place], [address]  [place], [place] [address] Tel: [phone] Fax: [phone] Test Date:  04/02/2024  Patient: Beth [name] DOB: 10/01/1961 Physician: Rommie Coats, MD  Sex: Female Height: 5\' 2"  Ref Phys: Rommie Coats, MD  ID#: [id number]   Technician:    History: This is a 63 year old female with back pain, numbness and tingling in her feet, and leg weakness.  NCV & EMG Findings: Extensive electrodiagnostic evaluation of bilateral lower limbs shows: Right superficial peroneal/fibular sensory response is absent. Left superficial peroneal/fibular and bilateral sural sensory responses are within normal limits. Left peroneal/fibular (EDB), bilateral tibial (AH), and right peroneal/fibular (TA) motor responses are within normal limits. Right peroneal/fibular (EDB) motor response is borderline normal. Bilateral H reflex latencies are within normal limits. Chronic motor axon loss changes without accompanying active denervation changes are seen in bilateral tibialis anterior, bilateral flexor digitorum longus, right fibularis longus, left rectus femoris, left iliacus, left adductor longus, and left lumbar paraspinal (L5 level) muscles.  Impression: This is an abnormal study. 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 It is possible these findings are technical in nature though and better explained by #1 above. No electrodiagnostic evidence of a large fiber sensorimotor neuropathy.    ___________________________ Rommie Coats, MD    Nerve Conduction Studies Motor Nerve Results    Latency Amplitude F-Lat Segment  Distance CV Comment  Site (ms) Norm (mV) Norm (ms)  (cm) (m/s) Norm   Left Fibular (EDB) Motor  Ankle 3.0  < 6.0 5.1  > 2.5        Bel fib head 9.6 - 4.7 -  Bel fib head-Ankle 29 44  > 40   Pop fossa 11.5 - 4.6 -  Pop fossa-Bel fib head 9 47 -   Right Fibular (EDB) Motor  Ankle 4.1  < 6.0 2.5  > 2.5        Bel fib head 10.8 - 2.3 -  Bel fib head-Ankle 28 42  > 40   Pop fossa 12.9 - 2.3 -  Pop fossa-Bel fib head 8.5 40 -   Right Fibular (TA) Motor  Fib head 2.3  < 4.5 3.9  > 3.0        Pop fossa 4.0  < 6.7 3.9 -  Pop fossa-Fib head 8.5 50  > 40   Left Tibial (AH) Motor  Ankle 3.5  < 6.0 19.6  > 4.0        Knee 11.2 - 14.2 -  Knee-Ankle 35 45  > 40   Right Tibial (AH) Motor  Ankle 3.8  < 6.0 14.4  > 4.0        Knee 12.4 - 9.8 -  Knee-Ankle - -  > 40    Sensory Sites    Neg Peak Lat Amplitude (O-P) Segment Distance Velocity Comment  Site (ms) Norm (V) Norm  (cm) (ms)   Left Superficial Fibular Sensory  14 cm-Ankle 2.6  < 4.6 3  > 3 14 cm-Ankle 14    Right Superficial Fibular Sensory  14 cm-Ankle *NR  < 4.6 *NR  > 3 14 cm-Ankle 14    Left Sural Sensory  Calf-Lat mall 3.4  < 4.6 4  >  3 Calf-Lat mall 14    Right Sural Sensory  Calf-Lat mall 3.1  < 4.6 4  > 3 Calf-Lat mall 10     H-Reflex Results    M-Lat H Lat H Neg Amp H-M Lat  Site (ms) (ms) Norm (mV) (ms)  Left Tibial H-Reflex  Pop fossa 5.5 32.2  < 35.0 0.81 26.7  Right Tibial H-Reflex  Pop fossa 7.3 34.0  < 35.0 1.15 26.7   Electromyography   Side Muscle Ins.Act Fibs Fasc Recrt Amp Dur Poly Activation Comment  Right Tib ant Nml Nml Nml *1- *1+ *1+ Nml Nml N/A  Right Gastroc MH Nml Nml Nml Nml Nml Nml Nml Nml N/A  Right FDL Nml Nml Nml *1- *1+ *1+ Nml Nml N/A  Right Rectus fem Nml Nml Nml Nml Nml Nml Nml Nml N/A  Right Fib longus Nml Nml Nml *1- *1+ *1+ Nml Nml N/A  Right Iliacus Nml Nml Nml Nml Nml Nml Nml Nml N/A  Right Biceps fem SH Nml Nml Nml Nml Nml Nml Nml Nml N/A  Right Gluteus med Nml Nml Nml Nml Nml Nml  Nml Nml N/A  Right Lumbar PSP lower Nml Nml Nml Nml Nml Nml Nml Nml N/A  Left Tib ant Nml Nml Nml *1- *1+ *1+ Nml Nml N/A  Left Gastroc MH Nml Nml Nml Nml Nml Nml Nml Nml N/A  Left FDL Nml Nml Nml *2- *1+ *1+ Nml Nml N/A  Left Rectus fem Nml Nml Nml *1- *1+ *1+ Nml Nml N/A  Left Iliacus Nml Nml Nml *1- *1+ *1+ Nml Nml N/A  Left Add longus Nml Nml Nml *1- *1+ *1+ Nml Nml N/A  Left Biceps fem SH Nml Nml Nml Nml Nml Nml Nml Nml N/A  Left Gluteus med Nml Nml Nml Nml Nml Nml Nml Nml N/A  Left Lumbar PSP lower Nml Nml Nml *1- *1+ *1+ Nml Nml N/A      Waveforms:  Motor             Sensory           H-Reflex

## 2024-04-04 ENCOUNTER — Telehealth: Payer: Self-pay

## 2024-04-04 NOTE — Telephone Encounter (Signed)
 Called pt to see if she has been scheduled at [facility] and she reported no. I sent all the paper work to them and they sent a order with " mail box full" I offered to give her the number to the hospital and but she said she knew it. I reminded her to call the Physical therapy and see what was going on there also. She said she was there and they needed more time but insurance would not okay it but they were going to check on it again.told her to call for questions or concerns.

## 2024-04-12 ENCOUNTER — Telehealth: Payer: Self-pay

## 2024-04-12 ENCOUNTER — Other Ambulatory Visit: Payer: Self-pay

## 2024-04-12 ENCOUNTER — Telehealth: Payer: Self-pay | Admitting: Neurology

## 2024-04-12 DIAGNOSIS — G589 Mononeuropathy, unspecified: Secondary | ICD-10-CM

## 2024-04-12 NOTE — Telephone Encounter (Signed)
 Beth [name]

## 2024-04-12 NOTE — Telephone Encounter (Signed)
 Pt LM with AN. She needs the MRI results sent to her doctor, Waymond Hailey

## 2024-04-12 NOTE — Telephone Encounter (Signed)
 Called pt and reported that I did sent the results of the MRI over there. She needs a referral due to she was release in October. Will sent it to Dr[facility]

## 2024-05-06 ENCOUNTER — Other Ambulatory Visit (HOSPITAL_COMMUNITY): Payer: Self-pay | Admitting: Internal Medicine

## 2024-05-06 DIAGNOSIS — M810 Age-related osteoporosis without current pathological fracture: Secondary | ICD-10-CM

## 2024-05-09 ENCOUNTER — Other Ambulatory Visit (HOSPITAL_COMMUNITY)

## 2024-05-30 ENCOUNTER — Inpatient Hospital Stay (HOSPITAL_COMMUNITY): Admission: RE | Admit: 2024-05-30 | Source: Ambulatory Visit

## 2024-07-02 ENCOUNTER — Other Ambulatory Visit (HOSPITAL_COMMUNITY)

## 2024-07-09 ENCOUNTER — Telehealth: Payer: Self-pay

## 2024-07-09 NOTE — Telephone Encounter (Signed)
 Called [facility] and relayed Dr. Loralee message. [facility] was appreciative and had no further questions.

## 2024-07-15 ENCOUNTER — Ambulatory Visit (HOSPITAL_COMMUNITY)
Admission: RE | Admit: 2024-07-15 | Discharge: 2024-07-15 | Disposition: A | Discharge status: Home / Self Care | Source: Ambulatory Visit | Attending: Internal Medicine | Admitting: Internal Medicine

## 2024-07-15 DIAGNOSIS — M810 Age-related osteoporosis without current pathological fracture: Secondary | ICD-10-CM | POA: Insufficient documentation

## 2024-08-21 NOTE — Progress Notes (Addendum)
 I saw Beth [name] in neurology clinic on 08/29/24 in follow up for weakness and cervical spine stenosis.  HPI: Beth [name] is a 63 y.o. year old female with a history of cervical spine stenosis s/p ACDF C4-6 (03/15/23), chronic pain (knee, neck, back pain on chronic opioid management), pre-DM, COPD, migraine, HTN, vit D deficiency, HLD, EtOH abuse, current smoker who we last saw on 02/23/24.  To briefly review: 01/27/23: Patient's symptoms started in 08/2022. She had a lot of blood tests that were drawn. Later that day she was walking around her bed and her legs gave out and she fell to the floor. She does not think you were hurt. The next day her hands were tingling. A couple of weeks later her right leg started tingling (whole thing). The left leg started about 1 month ago. She is falling about once every 2 weeks. She feels week in arms and legs. Patient's husband agrees that patient's weakness came on very suddenly around 08/2022. She denies any trauma prior to symptoms or any known illness.   Patient has never taken a statin that she can recall.   Patient broke her hip in 09/2021 when a rug went out from her. This was not related to leg weakness.   Patient's normal weight is about 85 lbs. She is now down about 10 lbs since 08/2022. She is not eating as much as she was. She occasionally has night sweats, maybe twice per week at most.    Patient feels like she is caught up with cancer screening.   She denies cramps or twitching.   The patient denies symptoms suggestive of oculobulbar weakness including diplopia, ptosis, dysphagia, poor saliva control, dysarthria/dysphonia, impaired mastication, facial weakness/droop.   EtOH use: every couple of weeks she will have a couple of drinks; prior to 2020, patient was a very strong drinker, up to 24 beers.  Restrictive diet? No Family history of neuropathy/myopathy/NM disease? No  02/23/24: Labs were significant for low B1. Supplementation with  thiamine  100 mg daily was recommended. She is taking this, B12, vit D, and multivitamin.   MRI brain showed no acute process, but right temporoparietal encephalomalacia (post ischemic or traumatic). MRI cervical spine severe canal stenosis with cord compression at C4-5 and severe bilateral foraminal stenosis at C4-5. There was also a cystic lesion seen at C5-6 without significant stenosis.   Due to this, patient was referred to NSGY. Patient had surgery on 03/15/23 (ACDF C4-6).Per NSGY notes from 06/27/23, patient had no pain and had made some improvements. She is going to PT. She thinks her walking is much improved.   Currently, she has numbness in her hands. This prevents her from doing much with her hands. She occasionally has numbness and tingling in her feet and occasional weakness. She has back pain as well.   She still only seldom has EtOH. She has lost another 5 lbs in the last year and 1 lbs in the last month. She states she is eating well, but maybe only twice per day. She does not use ensure.  Most recent Assessment and Plan (02/23/24): This is Beth [name], a 63 y.o. female with weakness and tingling in arms and legs and imbalance. She was previously found to have low B1, for which she now takes supplementation, and cervical spine stenosis now s/p decompression (ACDF C4-6). Her walking is much improved from prior. She does have some residual weakness and tingling and numbness in her arms that may be permanent  due to the prior cervical stenosis/radiculopathy. Her arms are somewhat stronger than prior though. She has some proximal lower extremity weakness today and sensory deficit now in LLE (was in RLE at last visit). Given this asymmetry, a lumbar radiculopathy is possible and would be masked by old cervical stenosis. Physical deconditioning could also be contributing.   Plan: -Discussed EMG, but patient would like to defer and get MRI instead -MRI lumbar spine wo contrast -Continue B1, B12,  and vit D supplementation -Physical therapy for ongoing weakness, particularly in proximal lower extremities -Increase gabapentin  to 600 mg at bedtime -Fall precautions discussed  Since their last visit: Insurance denied MRI lumbar spine, so EMG was recommended. EMG on 04/02/24 showed residuals of bilateral L5 and left L3 and L4 radiculopathy (mild). There may also be an overlapping right peroneal neuropathy, but less clear. I again recommended MRI lumbar spine given the EMG changes.  She never did the recommended MRI. She did not remember or hear from anyone.  Her legs are similar to prior. She continues to have tingling in her left > right leg. She cross her legs frequently.  She did PT and did think that helped a lot. She finished this about 2 months ago.  She has significant low back pain and occasional neck pain. She thinks are arms are similar to prior as well.  She has fallen once since last visit. She tripped over the carpet at her brothers house.  She denies any difficulties with bowel or bladder. She denies any difficulties with vision, chewing, and swallowing. She denies any significant weight loss.  She has no new complaints.   MEDICATIONS:  Outpatient Encounter Medications as of 08/29/2024  Medication Sig Note   acetaminophen  (TYLENOL ) 500 MG tablet Take 1,000 mg by mouth daily as needed for headache.    albuterol  (PROVENTIL  HFA;VENTOLIN  HFA) 108 (90 BASE) MCG/ACT inhaler Inhale 1-2 puffs into the lungs every 6 (six) hours as needed for wheezing or shortness of breath.    albuterol  (PROVENTIL ) (2.5 MG/3ML) 0.083% nebulizer solution Take 2.5 mg by nebulization every 6 (six) hours as needed for wheezing or shortness of breath.    alendronate (FOSAMAX) 70 MG tablet Take 70 mg by mouth once a week.    Cholecalciferol  (VITAMIN D3) 250 MCG (10000 UT) TABS Take 10,000 Units by mouth every morning. (Patient taking differently: Take 1,000 Units by mouth every morning.)    docusate  sodium (COLACE) 100 MG capsule Take 1 capsule (100 mg total) by mouth 2 (two) times daily.    Fluticasone -Umeclidin-Vilant (TRELEGY ELLIPTA) 100-62.5-25 MCG/ACT AEPB Inhale 1 puff into the lungs daily.    gabapentin  (NEURONTIN ) 300 MG capsule Take 2 capsules (600 mg total) by mouth at bedtime.    Magnesium  250 MG TABS Take 250 mg by mouth every morning.    methocarbamol  (ROBAXIN -750) 750 MG tablet Take 1 tablet (750 mg total) by mouth 4 (four) times daily. (Patient taking differently: Take 750 mg by mouth 3 (three) times daily.)    Multiple Minerals (JOINT HEALTH MINERAL PO) Take 1 tablet by mouth daily.    Multiple Vitamin (MULTIVITAMIN WITH MINERALS) TABS tablet Take 1 tablet by mouth every morning.    naproxen sodium (ALEVE) 220 MG tablet Take 220 mg by mouth daily as needed (headache). 03/02/2023: ON HOLD    nicotine  (NICODERM CQ  - DOSED IN MG/24 HOURS) 21 mg/24hr patch Place 21 mg onto the skin daily.    oxyCODONE -acetaminophen  (PERCOCET/ROXICET) 5-325 MG tablet Take 1 tablet by mouth  4 (four) times daily as needed (pain).    promethazine  (PHENERGAN ) 25 MG tablet Take 25 mg by mouth every 6 (six) hours as needed for vomiting or nausea. 03/02/2023: Needs Refilled    thiamine  (VITAMIN B-1) 100 MG tablet Take 100 mg by mouth daily.    No facility-administered encounter medications on file as of 08/29/2024.    PAST MEDICAL HISTORY: Past Medical History:  Diagnosis Date   Alcohol abuse 10/26/2011   Arthritis    Brain bleed (HCC) 2012   Cervical cancer [facility])    patient unsure- 30 years ago   COPD (chronic obstructive pulmonary disease) (HCC)    Displaced fracture of lateral malleolus of right fibula, initial encounter for closed fracture 02/13/2021   Hypertension    Lung infection    Tobacco abuse     PAST SURGICAL HISTORY: Past Surgical History:  Procedure Laterality Date   ANTERIOR CERVICAL DECOMP/DISCECTOMY FUSION N/A 03/15/2023   Procedure: Anterior Cervical Decompression/ Discectomy  Fusion Cervical Four-Cervical Five - Cervical Five-Cervical Six;  Surgeon: Joshua Alm RAMAN, MD;  Location: [facility] OR;  Service: Neurosurgery;  Laterality: N/A;  3C   BIOPSY  02/04/2020   Procedure: BIOPSY;  Surgeon: Harvey Margo CROME, MD;  Location: [facility] SUITE;  Service: Endoscopy;;   BRONCHOSCOPY     COLONOSCOPY WITH PROPOFOL  N/A 02/04/2020   Procedure: COLONOSCOPY WITH PROPOFOL ;  Surgeon: Harvey Margo CROME, MD;  Location: [facility];  Service: Endoscopy;  Laterality: N/A;  8:45am   INTRAMEDULLARY (IM) NAIL INTERTROCHANTERIC Right 10/17/2021   Procedure: INTRAMEDULLARY (IM) NAIL INTERTROCHANTRIC;  Surgeon: Ernie Cough, MD;  Location: [facility] ORS;  Service: Orthopedics;  Laterality: Right;   KNEE ARTHROSCOPY WITH MENISCAL REPAIR Right 08/25/2020   Procedure: KNEE ARTHROSCOPY WITH  MEDIAL MENISCAL REPAIR;  Surgeon: Margrette Taft BRAVO, MD;  Location: [facility] ORS;  Service: Orthopedics;  Laterality: Right;   MULTIPLE TOOTH EXTRACTIONS     ORIF ANKLE FRACTURE Right 02/14/2021   Procedure: OPEN REDUCTION INTERNAL FIXATION (ORIF) ANKLE FRACTURE;  Surgeon: Beverley Evalene BIRCH, MD;  Location: [facility] OR;  Service: Orthopedics;  Laterality: Right;   ORIF FEMUR FRACTURE Right 02/14/2021   Procedure: OPEN REDUCTION INTERNAL FIXATION (ORIF) DISTAL FEMUR FRACTURE;  Surgeon: Beverley Evalene BIRCH, MD;  Location: [facility] OR;  Service: Orthopedics;  Laterality: Right;   POLYPECTOMY  02/04/2020   Procedure: POLYPECTOMY;  Surgeon: Harvey Margo CROME, MD;  Location: [facility];  Service: Endoscopy;;   rod in arm Left     ALLERGIES: Allergies  Allergen Reactions   Amoxicillin Nausea Only   Codeine Itching    FAMILY HISTORY: Family History  Problem Relation Age of Onset   Throat cancer Mother    Lung cancer Father    Throat cancer Brother    Lung cancer Brother    Colon cancer Neg Hx    Breast cancer Neg Hx     SOCIAL HISTORY: Social History   Tobacco Use   Smoking status: Every Day    Current packs/day: 0.25    Average  packs/day: 0.3 packs/day for 35.0 years (8.8 ttl pk-yrs)    Types: Cigarettes   Smokeless tobacco: Never   Tobacco comments:    Less than I pack a day  Vaping Use   Vaping status: Never Used  Substance Use Topics   Alcohol use: Not Currently    Comment: rarely   Drug use: Yes    Types: Marijuana    Comment: occasionally   Social History   Social History Narrative  Are you right handed or left handed? Right   Are you currently employed ?    What is your current occupation?disability   Do you live at home alone?   Who lives with you? husband   What type of home do you live in: 1 story or 2 story? one    Caffeine 2 cups of coffee, 1- 2 sodas    Objective:  Vital Signs:  BP 112/73   Pulse 83   Ht 5' 2 (1.575 m)   Wt 83 lb (37.6 kg)   LMP 10/08/2019 Comment: Still spotting since November 10  SpO2 93%   BMI 15.18 kg/m   General: General appearance: Awake and alert. No distress. Cooperative with exam.  Skin: No obvious rash or jaundice. HEENT: Atraumatic. Anicteric. Lungs: Non-labored breathing on room air  Extremities: No edema.   Neurological: Mental Status: Alert. Speech fluent. No pseudobulbar affect Cranial Nerves: CNII: No RAPD. Visual fields intact. CNIII, IV, VI: PERRL. No nystagmus. EOMI. CN V: Facial sensation intact bilaterally to fine touch. CN VII: Facial muscles symmetric and strong. No ptosis at rest. CN VIII: Hears finger rub well bilaterally. CN IX: No hypophonia. CN X: Palate elevates symmetrically. CN XI: Full strength shoulder shrug bilaterally. CN XII: Tongue protrusion full and midline. No atrophy or fasciculations. No significant dysarthria Motor: Tone is increased in legs > arms. No fasciculations in any extremities.  Individual muscle group testing (MRC grade out of 5):  Movement     Neck flexion 5    Neck extension 5     Right Left   Shoulder abduction 5 5   Elbow flexion 5 5   Elbow extension 5 5   Finger abduction - FDI 4+  4+   Finger abduction - ADM 4+ 4+   Finger extension 4+ 4+   Finger distal flexion - 2/3 4+ 4+   Finger distal flexion - 4/5 4+ 4+   Thumb flexion - FPL 4+ 4+   Thumb abduction - APB 4+ 4+    Hip flexion 5- 5-   Hip extension 5 5   Hip adduction 5 5   Hip abduction 5 5   Knee extension 5 5   Knee flexion 5 5   Dorsiflexion 5 5   Plantarflexion 5 5    Reflexes:  Right Left  Bicep 3+ 3+  Tricep 3+ 3+  BrRad 3+ 3+  Knee 3+ 3+  Ankle 2+ 2+   Pathological Reflexes: Babinski: extensor response bilaterally Hoffman: present bilaterally Sensation: Pinprick: Diminished in LLE > RLE Coordination: Intact finger-to- nose-finger bilaterally. Gait: Unsteady gait, stiff legged walk, mildly wide based   Lab and Test Review: New results: EMG (04/02/24): NCV & EMG Findings: Extensive electrodiagnostic evaluation of bilateral lower limbs shows: Right superficial peroneal/fibular sensory response is absent. Left superficial peroneal/fibular and bilateral sural sensory responses are within normal limits. Left peroneal/fibular (EDB), bilateral tibial (AH), and right peroneal/fibular (TA) motor responses are within normal limits. Right peroneal/fibular (EDB) motor response is borderline normal. Bilateral H reflex latencies are within normal limits. Chronic motor axon loss changes without accompanying active denervation changes are seen in bilateral tibialis anterior, bilateral flexor digitorum longus, right fibularis longus, left rectus femoris, left iliacus, left adductor longus, and left lumbar paraspinal (L5 level) muscles.   Impression: This is an abnormal study. The findings are most consistent with the following: The residuals of old intraspinal canal lesions (ie: motor radiculopathy) at bilateral L5 and left L3 and L4 roots or segments,  mild in degree electrically. 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 It is possible these findings are technical in nature though and better explained by #1 above. No electrodiagnostic evidence of a large fiber sensorimotor neuropathy.  External MRI lumbar spine wo contrast (04/09/24):     Previously reviewed results: 01/27/23: Copper  wnl Vit E wnl Folate wnl B12: 431 B1: < 7   External labs: CMP (11/04/22): unremarkable CBC (11/04/22): unremarkable HbA1c (11/04/22): 5.5 ESR (08/05/22): 5 ANA (08/05/22): negative RF (08/05/22): < 7 TSH (08/05/22): 0.628 Vit D (08/05/22): 64.75   Imaging: CT head and cervical spine wo contrast (02/12/22): FINDINGS: CT HEAD FINDINGS   Brain: No evidence of acute infarction, hemorrhage, hydrocephalus, extra-axial collection, visible mass lesion or mass effect.   Vascular: Atherosclerotic calcification of the carotid siphons. No hyperdense vessel.   Skull: Chronic right supraorbital scarring. No acute soft tissue swelling, gas or laceration. No large scalp hematoma. No calvarial fracture or acute osseous injury within the included levels of imaging.   Sinuses/Orbits: Chronic rightward anterior nasal septal deviation and in contacting left-sided nasal septal spur. Paranasal sinuses and mastoid air cells are predominantly clear. Middle ear cavities are clear. Included orbital structures are unremarkable.   Other: None   CT CERVICAL SPINE FINDINGS   Alignment: Slight exaggeration of the upper cervical lordosis. Unchanged likely degenerative stepwise anterolisthesis C4-C7. Grossly unchanged alignment of a nonunited type 2 dens fracture and the asymmetric positioning of the lateral masses C1-C2. No acute traumatic listhesis is seen.   Skull base and vertebrae: Redemonstration of the chronic, well corticated type 2 dens fracture with posterior translation and angulation. No acute cervical spine fracture is seen. No visible skull base fractures or other acute  osseous abnormalities. No suspicious osseous lesions. Cervical spondylitic changes as below. Additional arthrosis about the atlantodental interval and basion dens intervals, unchanged from prior.   Soft tissues and spinal canal: No pre or paravertebral fluid or swelling. No visible canal hematoma.   Disc levels: Multilevel intervertebral disc height loss with spondylitic endplate changes. At most mild resulting canal stenosis C4-5, C5-6. Multilevel uncinate spurring and facet hypertrophic changes are present as well maximal C4-C7 where there is mild-to-moderate foraminal narrowing bilaterally.   Upper chest: No acute abnormality in the upper chest or imaged lung apices. Stable emphysematous changes.   Other: No concerning thyroid  nodule or mass.   IMPRESSION: 1. No acute intracranial abnormality. 2. Grossly unchanged alignment of the chronic, well corticated type 2 dens fracture with posterior translation and angulation. 3. No acute cervical spine fracture or traumatic listhesis. 4. Multilevel degenerative changes of the cervical spine as described above.  MRI cervical spine w/wo contrast (02/04/23): FINDINGS: Alignment: Chronic grade 1-2 anterolisthesis of C4 on C5 and grade 1 anterolisthesis of C5 on C6, and C6 on C7. Slightly exaggerated cervical lordosis.   Vertebrae: Chronic type 2 dens fracture with similar posterior angulation. No residual marrow edema at the fracture site. No acute fracture. No evidence of discitis. No marrow replacing bone lesion. Mild discogenic endplate marrow changes at C4-5.   Cord: Cord compression at the C4-5 level. T2/STIR hyperintense signal within the cord at this location likely reflecting chronic myelomalacia given the diminutive size of the cord at this level. Cord edema is not excluded, particularly along the inferior aspect of the area of signal abnormality (series 6, image 8).   There is a well circumscribed cyst with along the  posterior  epidural space on the right centered at the C5-6 level measuring 1.3 x 0.9 x 0.6 cm (series 11, image 7; series 8, image 22). Cyst walls are low in signal on T1 and T2 with smooth peripheral enhancement. It is unclear if this represents a synovial cyst arising from the right facet joint at C5-6 or C4-5 or represents a ganglion cyst arising from the ligamentum flavum. No additional epidural space abnormalities. No epidural fluid collection.   Posterior Fossa, vertebral arteries, paraspinal tissues: Negative.   Disc levels:   C2-C3: No disc protrusion. Bilateral facet joint arthropathy. No significant foraminal stenosis. No canal stenosis.   C3-C4: Small disc osteophyte complex with bilateral facet and uncovertebral spurring. Mild bilateral foraminal stenosis. No canal stenosis.   C4-C5: Disc osteophyte complex with bilateral facet and uncovertebral arthropathy. Severe canal stenosis with cord compression. Severe bilateral foraminal stenosis.   C5-C6: Disc osteophyte complex with bilateral facet arthropathy and uncovertebral spurring. There is mass effect from the cystic lesion in the posterior epidural space on the right. Findings contribute to mild stenosis of the canal on the right. Moderate bilateral foraminal stenosis.   C6-C7: Mild disc osteophyte complex with facet arthropathy. No foraminal or canal stenosis.   C7-T1: No significant disc protrusion, foraminal stenosis, or canal stenosis.   IMPRESSION: 1. Severe canal stenosis with cord compression at the C4-5 level. High signal within the cord at this location likely reflecting chronic myelomalacia given the diminutive size of the cord at this level. Cord edema is not excluded. 2. Well-circumscribed cystic lesion in the posterior epidural space on the right centered at the C5-6 level measuring 1.3 x 0.9 x 0.6 cm. It is unclear if this represents a facet synovial cyst versus a ganglion cyst arising from the  ligamentum flavum. Findings contribute to mild stenosis of the canal on the right at C5-6. 3. Severe bilateral foraminal stenosis at C4-5. Moderate bilateral foraminal stenosis at C5-6. 4. Chronic type 2 dens fracture with similar posterior angulation. No residual marrow edema at the fracture site. No acute fractures.   These results will be called to the ordering clinician or representative by the Radiologist Assistant, and communication documented in the PACS or [employer].   MRI brain w/wo contrast (02/04/23): FINDINGS: Brain: There is no evidence of an acute infarct, intracranial hemorrhage, mass, midline shift, or extra-axial fluid collection. Small T2 hyperintensities in the cerebral white matter bilaterally are nonspecific but compatible with mild chronic small vessel ischemic disease. Mild cortical encephalomalacia laterally at the right temporoparietal junction is unchanged. There is very mild generalized cerebral atrophy. No abnormal enhancement is identified.   Vascular: Major intracranial vascular flow voids are preserved.   Skull and upper cervical spine: No suspicious marrow lesion. Cervical spine reported separately.   Sinuses/Orbits: Remote left orbital fracture. Minimal mucosal thickening in the paranasal sinuses. Clear mastoid air cells.   Other: None.   IMPRESSION: 1. No acute intracranial abnormality. 2. Mild chronic small vessel ischemic disease. 3. Right temporoparietal encephalomalacia which could be post ischemic or traumatic.   Cervical spine xray (03/15/23): FINDINGS: One fluoroscopic image obtained during the performance of the procedure and provided for interpretation only. Images demonstrate ACDF hardware at C4-C6.   Fluoroscopy time: 17 seconds   Cumulative air kerma: 1.33 mGy   IMPRESSION: Intraoperative fluoroscopy for ACDF C4-C6.  ASSESSMENT: This is Beth [name], a 63 y.o. female with weakness and tingling in arms and legs and  imbalance. She was previously found to have low B1, for  which she now takes supplementation, and cervical spine stenosis now s/p decompression (ACDF C4-6). She does have some residual weakness and tingling and numbness in her arms that may be permanent due to the prior cervical stenosis/radiculopathy. She also continues to have hyperreflexia and increased tone with spastic appearing gait. Her proximal lower extremity weakness has improved after PT. EMG of lower extremities on 04/02/24 showed bilateral L5 and left L3-4 radiculopathy. She is having significant low back pain. She has not completed the recommended MRI lumbar spine.   ADDENDUM: On further chart review, patient did have MRI lumbar spine done externally in [facility] system (found under media) on 04/09/24. It showed no more than moderate narrowing.  Plan:  See above, patient already had. -Spoke to patient about pain management for injections or other pain control options, but she previously had a bad experience with her knee, so would prefer to hold off on this for now -Continue gabapentin  600 mg at bedtime -Continue B1, B12, vit D supplementation -Continue home exercises given by PT -Fall precautions discussed  Return to clinic in 6 months  Total time spent reviewing records, interview, history/exam, documentation, and coordination of care on day of encounter:  30 min  Venetia Potters, MD

## 2024-08-29 ENCOUNTER — Encounter: Payer: Self-pay | Admitting: Neurology

## 2024-08-29 ENCOUNTER — Ambulatory Visit: Admitting: Neurology

## 2024-08-29 VITALS — BP 112/73 | HR 83 | Ht 62.0 in | Wt 83.0 lb

## 2024-08-29 DIAGNOSIS — R5381 Other malaise: Secondary | ICD-10-CM

## 2024-08-29 DIAGNOSIS — R200 Anesthesia of skin: Secondary | ICD-10-CM

## 2024-08-29 DIAGNOSIS — R269 Unspecified abnormalities of gait and mobility: Secondary | ICD-10-CM

## 2024-08-29 DIAGNOSIS — M5417 Radiculopathy, lumbosacral region: Secondary | ICD-10-CM | POA: Diagnosis not present

## 2024-08-29 DIAGNOSIS — R202 Paresthesia of skin: Secondary | ICD-10-CM

## 2024-08-29 DIAGNOSIS — M545 Low back pain, unspecified: Secondary | ICD-10-CM

## 2024-08-29 DIAGNOSIS — M4802 Spinal stenosis, cervical region: Secondary | ICD-10-CM

## 2024-08-29 DIAGNOSIS — M6289 Other specified disorders of muscle: Secondary | ICD-10-CM

## 2024-08-29 DIAGNOSIS — G8929 Other chronic pain: Secondary | ICD-10-CM

## 2024-08-29 NOTE — Patient Instructions (Addendum)
 I will reorder the MRI of your lumbar spine today. Please let me know if you do not hear from someone to schedule in 1-2 weeks.  Continue the exercises given by PT for your back/legs.  Continue gabapentin  600 mg at bedtime  Continue B1 100 mg daily.  Continue B12 1000 mcg daily.  Continue vitamin D  1000 international units daily.  I will be in touch when I have your MRI lumbar spine results.  Please let me know if you have any questions or concerns in the meantime.  The physicians and staff at [facility] are committed to providing excellent care. You may receive a survey requesting feedback about your experience at our office. We strive to receive very good responses to the survey questions. If you feel that your experience would prevent you from giving the office a very good  response, please contact our office to try to remedy the situation. We may be reached at [phone]. Thank you for taking the time out of your busy day to complete the survey.  Venetia Potters, MD [facility]  Preventing Falls at [facility] are common, often dreaded events in the lives of older people. Aside from the obvious injuries and even death that may result, fall can cause wide-ranging consequences including loss of independence, mental decline, decreased activity and mobility. Younger people are also at risk of falling, especially those with chronic illnesses and fatigue.  Ways to reduce risk for falling Examine diet and medications. Warm foods and alcohol dilate blood vessels, which can lead to dizziness when standing. Sleep aids, antidepressants and pain medications can also increase the likelihood of a fall.  Get a vision exam. Poor vision, cataracts and glaucoma increase the chances of falling.  Check foot gear. Shoes should fit snugly and have a sturdy, nonskid sole and a broad, low heel  Participate in a physician-approved exercise program to build and maintain muscle strength and  improve balance and coordination. Programs that use ankle weights or stretch bands are excellent for muscle-strengthening. Water aerobics programs and low-impact Tai Chi programs have also been shown to improve balance and coordination.  Increase vitamin D  intake. Vitamin D  improves muscle strength and increases the amount of calcium the body is able to absorb and deposit in bones.  How to prevent falls from common hazards Floors - Remove all loose wires, cords, and throw rugs. Minimize clutter. Make sure rugs are anchored and smooth. Keep furniture in its usual place.  Chairs -- Use chairs with straight backs, armrests and firm seats. Add firm cushions to existing pieces to add height.  Bathroom - Install grab bars and non-skid tape in the tub or shower. Use a bathtub transfer bench or a shower chair with a back support Use an elevated toilet seat and/or safety rails to assist standing from a low surface. Do not use towel racks or bathroom tissue holders to help you stand.  Lighting - Make sure halls, stairways, and entrances are well-lit. Install a night light in your bathroom or hallway. Make sure there is a light switch at the top and bottom of the staircase. Turn lights on if you get up in the middle of the night. Make sure lamps or light switches are within reach of the bed if you have to get up during the night.  Kitchen - Install non-skid rubber mats near the sink and stove. Clean spills immediately. Store frequently used utensils, pots, pans between waist and eye level. This helps prevent reaching and  bending. Sit when getting things out of lower cupboards.  Living room/ Bedrooms - Place furniture with wide spaces in between, giving enough room to move around. Establish a route through the living room that gives you something to hold onto as you walk.  Stairs - Make sure treads, rails, and rugs are secure. Install a rail on both sides of the stairs. If stairs are a threat, it might be  helpful to arrange most of your activities on the lower level to reduce the number of times you must climb the stairs.  Entrances and doorways - Install metal handles on the walls adjacent to the doorknobs of all doors to make it more secure as you travel through the doorway.  Tips for maintaining balance Keep at least one hand free at all times. Try using a backpack or fanny pack to hold things rather than carrying them in your hands. Never carry objects in both hands when walking as this interferes with keeping your balance.  Attempt to swing both arms from front to back while walking. This might require a conscious effort if Parkinson's disease has diminished your movement. It will, however, help you to maintain balance and posture, and reduce fatigue.  Consciously lift your feet off of the ground when walking. Shuffling and dragging of the feet is a common culprit in losing your balance.  When trying to navigate turns, use a U technique of facing forward and making a wide turn, rather than pivoting sharply.  Try to stand with your feet shoulder-length apart. When your feet are close together for any length of time, you increase your risk of losing your balance and falling.  Do one thing at a time. Don't try to walk and accomplish another task, such as reading or looking around. The decrease in your automatic reflexes complicates motor function, so the less distraction, the better.  Do not wear rubber or gripping soled shoes, they might catch on the floor and cause tripping.  Move slowly when changing positions. Use deliberate, concentrated movements and, if needed, use a grab bar or walking aid. Count 15 seconds between each movement. For example, when rising from a seated position, wait 15 seconds after standing to begin walking.  If balance is a continuous problem, you might want to consider a walking aid such as a cane, walking stick, or walker. Once you've mastered walking with help,  you might be ready to try it on your own again.

## 2024-08-30 NOTE — Addendum Note (Signed)
 Addended by: TERRIL CHARLIES MATSU on: 08/30/2024 08:35 AM   Modules accepted: Orders

## 2024-10-02 ENCOUNTER — Other Ambulatory Visit: Payer: Self-pay

## 2024-10-02 DIAGNOSIS — G8929 Other chronic pain: Secondary | ICD-10-CM

## 2024-10-02 DIAGNOSIS — R269 Unspecified abnormalities of gait and mobility: Secondary | ICD-10-CM

## 2024-10-11 ENCOUNTER — Telehealth: Payer: Self-pay

## 2024-10-11 NOTE — Telephone Encounter (Signed)
 CALLED pt  no answer and was unable to leave message.

## 2024-10-11 NOTE — Telephone Encounter (Signed)
 Pt called in this afternoon. She was returning Renee's call. Thanks

## 2024-10-15 ENCOUNTER — Other Ambulatory Visit: Payer: Self-pay

## 2024-10-15 DIAGNOSIS — G9520 Unspecified cord compression: Secondary | ICD-10-CM

## 2024-10-15 DIAGNOSIS — G8929 Other chronic pain: Secondary | ICD-10-CM

## 2024-10-15 DIAGNOSIS — M6289 Other specified disorders of muscle: Secondary | ICD-10-CM

## 2024-10-15 DIAGNOSIS — R2689 Other abnormalities of gait and mobility: Secondary | ICD-10-CM

## 2024-10-15 DIAGNOSIS — M4802 Spinal stenosis, cervical region: Secondary | ICD-10-CM

## 2024-10-15 DIAGNOSIS — R531 Weakness: Secondary | ICD-10-CM

## 2024-10-15 DIAGNOSIS — G589 Mononeuropathy, unspecified: Secondary | ICD-10-CM

## 2024-10-17 NOTE — Telephone Encounter (Signed)
 Called and informed her of Referral to Pain Medicine Dr. And to be looking out for there call. She understood.

## 2024-10-18 ENCOUNTER — Ambulatory Visit: Admitting: Orthopedic Surgery

## 2025-03-06 ENCOUNTER — Ambulatory Visit: Admitting: Neurology
